# Patient Record
Sex: Male | Born: 1940
Health system: Southern US, Community
[De-identification: ages and names within clinical notes are randomized; demographics above are authoritative.]

## PROBLEM LIST (undated history)

## (undated) DIAGNOSIS — C189 Malignant neoplasm of colon, unspecified: Secondary | ICD-10-CM

## (undated) DIAGNOSIS — T148XXA Other injury of unspecified body region, initial encounter: Secondary | ICD-10-CM

## (undated) DIAGNOSIS — I10 Essential (primary) hypertension: Secondary | ICD-10-CM

## (undated) DIAGNOSIS — K469 Unspecified abdominal hernia without obstruction or gangrene: Secondary | ICD-10-CM

## (undated) HISTORY — PX: EYE SURGERY: SHX253

## (undated) HISTORY — DX: Unspecified abdominal hernia without obstruction or gangrene: K46.9

## (undated) HISTORY — PX: HERNIA REPAIR: SHX51

## (undated) HISTORY — PX: VASECTOMY: SHX75

## (undated) HISTORY — PX: FRACTURE SURGERY: SHX138

## (undated) HISTORY — PX: TONSILLECTOMY: SUR1361

---

## 2012-09-29 DIAGNOSIS — E119 Type 2 diabetes mellitus without complications: Secondary | ICD-10-CM | POA: Insufficient documentation

## 2012-09-29 DIAGNOSIS — E1165 Type 2 diabetes mellitus with hyperglycemia: Secondary | ICD-10-CM | POA: Insufficient documentation

## 2012-09-29 DIAGNOSIS — E1140 Type 2 diabetes mellitus with diabetic neuropathy, unspecified: Secondary | ICD-10-CM | POA: Insufficient documentation

## 2012-09-29 DIAGNOSIS — T7840XA Allergy, unspecified, initial encounter: Secondary | ICD-10-CM | POA: Insufficient documentation

## 2017-05-28 ENCOUNTER — Other Ambulatory Visit: Payer: Self-pay | Admitting: Family Medicine

## 2017-05-28 ENCOUNTER — Ambulatory Visit (INDEPENDENT_AMBULATORY_CARE_PROVIDER_SITE_OTHER): Payer: PPO | Admitting: Family Medicine

## 2017-05-28 ENCOUNTER — Encounter: Payer: Self-pay | Admitting: Family Medicine

## 2017-05-28 VITALS — BP 156/82 | HR 71 | Resp 16 | Ht <= 58 in | Wt 228.0 lb

## 2017-05-28 DIAGNOSIS — R2681 Unsteadiness on feet: Secondary | ICD-10-CM

## 2017-05-28 DIAGNOSIS — Z8639 Personal history of other endocrine, nutritional and metabolic disease: Secondary | ICD-10-CM | POA: Insufficient documentation

## 2017-05-28 DIAGNOSIS — E669 Obesity, unspecified: Secondary | ICD-10-CM

## 2017-05-28 DIAGNOSIS — R739 Hyperglycemia, unspecified: Secondary | ICD-10-CM | POA: Insufficient documentation

## 2017-05-28 DIAGNOSIS — R011 Cardiac murmur, unspecified: Secondary | ICD-10-CM

## 2017-05-28 DIAGNOSIS — I878 Other specified disorders of veins: Secondary | ICD-10-CM | POA: Insufficient documentation

## 2017-05-28 DIAGNOSIS — N3001 Acute cystitis with hematuria: Secondary | ICD-10-CM | POA: Diagnosis not present

## 2017-05-28 DIAGNOSIS — I10 Essential (primary) hypertension: Secondary | ICD-10-CM

## 2017-05-28 DIAGNOSIS — R300 Dysuria: Secondary | ICD-10-CM

## 2017-05-28 HISTORY — DX: Unsteadiness on feet: R26.81

## 2017-05-28 HISTORY — DX: Other specified disorders of veins: I87.8

## 2017-05-28 HISTORY — DX: Obesity, unspecified: E66.9

## 2017-05-28 HISTORY — DX: Personal history of other endocrine, nutritional and metabolic disease: Z86.39

## 2017-05-28 HISTORY — DX: Cardiac murmur, unspecified: R01.1

## 2017-05-28 LAB — POCT URINALYSIS DIPSTICK
Bilirubin, UA: NEGATIVE
Glucose, UA: NEGATIVE
Ketones, UA: NEGATIVE
Nitrite, UA: POSITIVE
PH UA: 6 (ref 5.0–8.0)
Spec Grav, UA: 1.015 (ref 1.010–1.025)
UROBILINOGEN UA: 0.2 U/dL

## 2017-05-28 MED ORDER — LISINOPRIL 5 MG PO TABS: 5.0000 mg | ORAL_TABLET | Freq: Every day | ORAL | 2 refills | Status: DC

## 2017-05-28 MED ORDER — SULFAMETHOXAZOLE-TRIMETHOPRIM 800-160 MG PO TABS
1.0000 | ORAL_TABLET | Freq: Two times a day (BID) | ORAL | 0 refills | Status: DC
Start: 2017-05-28 — End: 2017-06-30

## 2017-05-28 NOTE — Progress Notes (Signed)
Date:  05/28/2017   Name:  Keith Reynolds   DOB:  September 14, 1940   MRN:  [id number]  PCP:  Adline Potter, MD    Chief Complaint: Establish Care; Hypertension (BP used to be 120-140/80 and now after activity in morning he goes up 160-200/80 and later it goes down. Worse at dentist. ); and Dysuria (off and on since November. Added cranberry juice and it helped but it does keep coming back. )   History of Present Illness:  This is a 77 y.o. male seen for initial visit. Hx T2DM with BGs in 300s and BLE pain in 2013, resolved with improved diet, has not been rechecked since 2014. Has noticed elevated SBP at dentist since November, no hx HTN. Also c/o intermittent dysuria since November generally well controlled with cranberry juice bid, no hx UTI. Had hernia surgery 2002, ok since. Shingles 8 yrs ago, none since. On no medssupps, uses ibuprofen rarely. Father died MI 81, mother died 61 unknown cause, sister died CVA, sister 54 with IDDM. Declines all imms, had colonoscopy 40 yrs ago and stool checked for blood 2013, both normal. Weight stable, no recent falls. Wears glasses, last saw optho 2017.  Review of Systems:  Review of Systems  Constitutional: Negative for chills, fatigue and fever.  HENT: Negative for ear pain, sinus pain and trouble swallowing.   Eyes: Negative for pain.  Respiratory: Negative for cough and shortness of breath.   Cardiovascular: Negative for chest pain and leg swelling.  Gastrointestinal: Negative for abdominal pain, constipation, diarrhea, nausea and vomiting.  Endocrine: Negative for polydipsia and polyuria.  Genitourinary: Negative for difficulty urinating and hematuria.  Musculoskeletal: Negative for joint swelling.  Neurological: Negative for tremors, syncope, weakness and light-headedness.  Psychiatric/Behavioral: Negative for sleep disturbance.    Patient Active Problem List   Diagnosis Date Noted  . Hyperglycemia 05/28/2017  . Hypertension 05/28/2017   . Heart murmur, systolic 10/11/1599  . History of diabetes mellitus 05/28/2017  . Obesity (BMI 30-39.9) 05/28/2017  . Gait instability 05/28/2017  . Chronic venous stasis 05/28/2017  . Allergy 09/29/2012  . Diabetic neuropathy ([place]) 09/29/2012  . Uncontrolled diabetes mellitus ([place]) 09/29/2012    Prior to Admission medications   Medication Sig Start Date End Date Taking? Authorizing Provider  lisinopril (PRINIVIL,ZESTRIL) 5 MG tablet Take 1 tablet (5 mg total) by mouth daily. 05/28/17   Sutton Hirsch, Gwyndolyn Saxon, MD  sulfamethoxazole-trimethoprim (BACTRIM DS,SEPTRA DS) 800-160 MG tablet Take 1 tablet by mouth 2 (two) times daily. 05/28/17   Adline Potter, MD    Allergies  Allergen Reactions  . Antihistamines, Diphenhydramine-Type   . Chamomile Other (See Comments)  . Diphenhydramine Hcl Other (See Comments)    dizziness  . Pseudoephedrine Hcl Other (See Comments)    dizziness  . Triprolidine Hcl Other (See Comments)    dizziness    Past Surgical History:  Procedure Laterality Date  . HERNIA REPAIR      Social History   Tobacco Use  . Smoking status: Former Smoker    Packs/day: 0.25    Last attempt to quit: 04/14/1983    Years since quitting: 34.1  . Smokeless tobacco: Never Used  Substance Use Topics  . Alcohol use: No    Frequency: Never  . Drug use: No    Family History  Problem Relation Age of Onset  . Heart attack Father   . Diabetes Sister     Medication list has been reviewed and updated.  Physical Examination: BP (!) 156/82  Pulse 71   Resp 16   Ht 3\' 5"  (1.041 m)   Wt 228 lb (103.4 kg)   SpO2 97%   BMI 95.36 kg/m   Physical Exam  Constitutional: He is oriented to person, place, and time. He appears well-developed and well-nourished.  HENT:  Head: Normocephalic and atraumatic.  Right Ear: External ear normal.  Left Ear: External ear normal.  Nose: Nose normal.  Mouth/Throat: Oropharynx is clear and moist.  TMs clear  Eyes: Conjunctivae and EOM are  normal. Pupils are equal, round, and reactive to light.  Neck: Neck supple. No thyromegaly present.  Cardiovascular: Normal rate, regular rhythm and intact distal pulses.  2/6 SEM at RUSB  Pulmonary/Chest: Effort normal and breath sounds normal.  Abdominal: Soft. He exhibits no distension and no mass. There is no tenderness.  Musculoskeletal:  Trace BLE edema  Lymphadenopathy:    He has no cervical adenopathy.  Neurological: He is alert and oriented to person, place, and time. Coordination normal.  Romberg wobbly, gait sl unsteady  Skin: Skin is warm and dry.  Chronic venous changes BLE  Psychiatric: He has a normal mood and affect. His behavior is normal.  Nursing note and vitals reviewed.   Assessment and Plan:  1. Acute cystitis with hematuria Bactrim DS bid x 3d, recheck UA next visit - Urine Culture  2. Dysuria UA sm blood, neg glucose, lg leuk, pos nit - POCT urinalysis dipstick  3. Essential hypertension Begin lisinopril 5 mg daily, consider diuretic if a1c ok - Comprehensive Metabolic Panel (CMET) - CBC  4. Heart murmur, systolic Asymptomatic, monitor  5. Obesity (BMI 30-39.9) - TSH - Lipid Profile  6. Gait instability - B12  7. Chronic venous stasis Asymptomatic, monitor  8. History of diabetes mellitus Diet controlled since 2013 - HgB A1c  Return in about 4 weeks (around 06/25/2017).   45 minutes spent with pt over half in counseling  Shanterria Franta M. [facility]  05/28/2017

## 2017-05-29 LAB — CBC
HEMOGLOBIN: 16.6 g/dL (ref 13.0–17.7)
Hematocrit: 47.6 % (ref 37.5–51.0)
MCH: 32.4 pg (ref 26.6–33.0)
MCHC: 34.9 g/dL (ref 31.5–35.7)
MCV: 93 fL (ref 79–97)
Platelets: 179 10*3/uL (ref 150–379)
RBC: 5.13 x10E6/uL (ref 4.14–5.80)
RDW: 14.2 % (ref 12.3–15.4)
WBC: 6 10*3/uL (ref 3.4–10.8)

## 2017-05-29 LAB — LIPID PANEL
CHOL/HDL RATIO: 3.7 ratio (ref 0.0–5.0)
Cholesterol, Total: 148 mg/dL (ref 100–199)
HDL: 40 mg/dL (ref >39)
LDL CALC: 85 mg/dL (ref 0–99)
Triglycerides: 114 mg/dL (ref 0–149)
VLDL Cholesterol Cal: 23 mg/dL (ref 5–40)

## 2017-05-29 LAB — COMPREHENSIVE METABOLIC PANEL
A/G RATIO: 1.9 (ref 1.2–2.2)
ALT: 21 IU/L (ref 0–44)
AST: 23 IU/L (ref 0–40)
Albumin: 4.7 g/dL (ref 3.5–4.8)
Alkaline Phosphatase: 70 IU/L (ref 39–117)
BUN/Creatinine Ratio: 21 (ref 10–24)
BUN: 13 mg/dL (ref 8–27)
Bilirubin Total: 0.9 mg/dL (ref 0.0–1.2)
CALCIUM: 9.9 mg/dL (ref 8.6–10.2)
CO2: 25 mmol/L (ref 20–29)
CREATININE: 0.62 mg/dL — AB (ref 0.76–1.27)
Chloride: 95 mmol/L — ABNORMAL LOW (ref 96–106)
GFR, EST AFRICAN AMERICAN: 111 mL/min/{1.73_m2} (ref >59)
GFR, EST NON AFRICAN AMERICAN: 96 mL/min/{1.73_m2} (ref >59)
GLOBULIN, TOTAL: 2.5 g/dL (ref 1.5–4.5)
Glucose: 144 mg/dL — ABNORMAL HIGH (ref 65–99)
Potassium: 4.8 mmol/L (ref 3.5–5.2)
SODIUM: 138 mmol/L (ref 134–144)
TOTAL PROTEIN: 7.2 g/dL (ref 6.0–8.5)

## 2017-05-29 LAB — HEMOGLOBIN A1C
ESTIMATED AVERAGE GLUCOSE: 123 mg/dL
HEMOGLOBIN A1C: 5.9 % — AB (ref 4.8–5.6)

## 2017-05-29 LAB — VITAMIN B12: Vitamin B-12: 681 pg/mL (ref 232–1245)

## 2017-05-29 LAB — TSH: TSH: 2.16 u[IU]/mL (ref 0.450–4.500)

## 2017-05-30 LAB — URINE CULTURE

## 2017-06-03 ENCOUNTER — Encounter: Payer: Self-pay | Admitting: Family Medicine

## 2017-06-03 DIAGNOSIS — R7303 Prediabetes: Secondary | ICD-10-CM | POA: Insufficient documentation

## 2017-06-30 ENCOUNTER — Encounter: Payer: Self-pay | Admitting: Family Medicine

## 2017-06-30 ENCOUNTER — Ambulatory Visit (INDEPENDENT_AMBULATORY_CARE_PROVIDER_SITE_OTHER): Payer: PPO | Admitting: Family Medicine

## 2017-06-30 VITALS — BP 128/84 | HR 71 | Resp 16 | Ht 67.0 in | Wt 232.0 lb

## 2017-06-30 DIAGNOSIS — R319 Hematuria, unspecified: Secondary | ICD-10-CM | POA: Diagnosis not present

## 2017-06-30 DIAGNOSIS — R7303 Prediabetes: Secondary | ICD-10-CM

## 2017-06-30 DIAGNOSIS — I10 Essential (primary) hypertension: Secondary | ICD-10-CM | POA: Diagnosis not present

## 2017-06-30 DIAGNOSIS — E669 Obesity, unspecified: Secondary | ICD-10-CM | POA: Diagnosis not present

## 2017-06-30 LAB — POCT URINALYSIS DIPSTICK
Bilirubin, UA: NEGATIVE
Blood, UA: NEGATIVE
Clarity, UA: NORMAL
Glucose, UA: NEGATIVE
KETONES UA: NEGATIVE
Leukocytes, UA: NEGATIVE
NITRITE UA: NEGATIVE
PH UA: 6.5 (ref 5.0–8.0)
PROTEIN UA: NEGATIVE
SPEC GRAV UA: 1.01 (ref 1.010–1.025)
UROBILINOGEN UA: 0.2 U/dL

## 2017-06-30 NOTE — Progress Notes (Signed)
Date:  06/30/2017   Name:  Keith Reynolds   DOB:  01-18-1941   MRN:  [id number]  PCP:  Adline Potter, MD    Chief Complaint: Hypertension (Taking 3 times a day one minute apart in AM usually. Was 127/72 this morning then as he moved around and did things it went to 165/84 and 175/93 ) and Cystitis (follow up -Had severe reaction to Rx and finished it but did not agree with how bad he was on this. )   History of Present Illness:  This is a 77 y.o. male seen for one month f/u from initial visit. Lisinopril started for HTN, BP still high at dentist office and with activity but tolerating med well. Had nausea with Bactrim but completed course, cx negative. Up to 20 mins exercise daily. Labs ok, prediabetes well controlled. Still declines all imms.  Review of Systems:  Review of Systems  Constitutional: Negative for chills and fever.  Respiratory: Negative for cough and shortness of breath.   Cardiovascular: Negative for chest pain.  Genitourinary: Negative for difficulty urinating.  Neurological: Negative for syncope and light-headedness.    Patient Active Problem List   Diagnosis Date Noted  . Prediabetes 06/03/2017  . Hypertension 05/28/2017  . Heart murmur, systolic 97/05/6376  . History of diabetes mellitus 05/28/2017  . Obesity (BMI 30-39.9) 05/28/2017  . Gait instability 05/28/2017  . Chronic venous stasis 05/28/2017    Prior to Admission medications   Medication Sig Start Date End Date Taking? Authorizing Provider  lisinopril (PRINIVIL,ZESTRIL) 5 MG tablet Take 1 tablet (5 mg total) by mouth daily. 05/28/17  Yes Marvion Bastidas, Gwyndolyn Saxon, MD    Allergies  Allergen Reactions  . Antihistamines, Diphenhydramine-Type   . Bactrim [Sulfamethoxazole-Trimethoprim] Nausea Only  . Chamomile Other (See Comments)  . Diphenhydramine Hcl Other (See Comments)    dizziness  . Pseudoephedrine Hcl Other (See Comments)    dizziness  . Triprolidine Hcl Other (See Comments)    dizziness     Past Surgical History:  Procedure Laterality Date  . HERNIA REPAIR      Social History   Tobacco Use  . Smoking status: Former Smoker    Packs/day: 0.25    Last attempt to quit: 04/14/1983    Years since quitting: 34.2  . Smokeless tobacco: Never Used  Substance Use Topics  . Alcohol use: No    Frequency: Never  . Drug use: No    Family History  Problem Relation Age of Onset  . Heart attack Father   . Diabetes Sister     Medication list has been reviewed and updated.  Physical Examination: BP 128/84   Pulse 71   Resp 16   Ht 5\' 7"  (1.702 m)   Wt 232 lb (105.2 kg)   SpO2 100%   BMI 36.34 kg/m   Physical Exam  Constitutional: He appears well-developed and well-nourished.  Cardiovascular: Normal rate and regular rhythm.  Pulmonary/Chest: Effort normal and breath sounds normal.  Musculoskeletal: He exhibits no edema.  Neurological: He is alert.  Skin: Skin is warm and dry.  Psychiatric: He has a normal mood and affect. His behavior is normal.  Nursing note and vitals reviewed.   Assessment and Plan:  1. Essential hypertension Well controlled on lisinopril, note for dentist given, reassured transient high BPs ok  2. Prediabetes Well controlled on diet  3. Obesity (BMI 30-39.9) Weight up 4#, encouraged increase exercise to 30 mins daily  4. Hematuria, unspecified type UA negative today - POCT  Urinalysis Dipstick  Return in about 3 months (around 09/30/2017).  Satira Anis. [facility]  06/30/2017

## 2017-09-30 ENCOUNTER — Ambulatory Visit: Payer: PPO | Admitting: Family Medicine

## 2018-05-13 DIAGNOSIS — R7302 Impaired glucose tolerance (oral): Secondary | ICD-10-CM | POA: Diagnosis not present

## 2018-05-13 DIAGNOSIS — I10 Essential (primary) hypertension: Secondary | ICD-10-CM | POA: Diagnosis not present

## 2018-05-16 DIAGNOSIS — R7302 Impaired glucose tolerance (oral): Secondary | ICD-10-CM | POA: Diagnosis not present

## 2018-05-16 DIAGNOSIS — I10 Essential (primary) hypertension: Secondary | ICD-10-CM | POA: Diagnosis not present

## 2018-06-03 DIAGNOSIS — H2513 Age-related nuclear cataract, bilateral: Secondary | ICD-10-CM | POA: Diagnosis not present

## 2018-06-20 DIAGNOSIS — H2513 Age-related nuclear cataract, bilateral: Secondary | ICD-10-CM | POA: Diagnosis not present

## 2018-06-28 DIAGNOSIS — I10 Essential (primary) hypertension: Secondary | ICD-10-CM | POA: Diagnosis not present

## 2018-06-28 DIAGNOSIS — E785 Hyperlipidemia, unspecified: Secondary | ICD-10-CM | POA: Diagnosis not present

## 2018-06-28 DIAGNOSIS — E1169 Type 2 diabetes mellitus with other specified complication: Secondary | ICD-10-CM | POA: Diagnosis not present

## 2018-08-23 DIAGNOSIS — Z1159 Encounter for screening for other viral diseases: Secondary | ICD-10-CM | POA: Diagnosis not present

## 2018-08-23 DIAGNOSIS — Z20828 Contact with and (suspected) exposure to other viral communicable diseases: Secondary | ICD-10-CM | POA: Diagnosis not present

## 2018-08-25 DIAGNOSIS — Z882 Allergy status to sulfonamides status: Secondary | ICD-10-CM | POA: Diagnosis not present

## 2018-08-25 DIAGNOSIS — H2511 Age-related nuclear cataract, right eye: Secondary | ICD-10-CM | POA: Diagnosis not present

## 2018-08-25 DIAGNOSIS — E785 Hyperlipidemia, unspecified: Secondary | ICD-10-CM | POA: Diagnosis not present

## 2018-08-25 DIAGNOSIS — Z87891 Personal history of nicotine dependence: Secondary | ICD-10-CM | POA: Diagnosis not present

## 2018-08-25 DIAGNOSIS — E1136 Type 2 diabetes mellitus with diabetic cataract: Secondary | ICD-10-CM | POA: Diagnosis not present

## 2018-08-25 DIAGNOSIS — I10 Essential (primary) hypertension: Secondary | ICD-10-CM | POA: Diagnosis not present

## 2018-08-30 DIAGNOSIS — Z1159 Encounter for screening for other viral diseases: Secondary | ICD-10-CM | POA: Diagnosis not present

## 2018-08-30 DIAGNOSIS — Z20828 Contact with and (suspected) exposure to other viral communicable diseases: Secondary | ICD-10-CM | POA: Diagnosis not present

## 2018-09-01 DIAGNOSIS — I10 Essential (primary) hypertension: Secondary | ICD-10-CM | POA: Diagnosis not present

## 2018-09-01 DIAGNOSIS — Z79899 Other long term (current) drug therapy: Secondary | ICD-10-CM | POA: Diagnosis not present

## 2018-09-01 DIAGNOSIS — H2512 Age-related nuclear cataract, left eye: Secondary | ICD-10-CM | POA: Diagnosis not present

## 2018-09-01 DIAGNOSIS — Z882 Allergy status to sulfonamides status: Secondary | ICD-10-CM | POA: Diagnosis not present

## 2018-09-01 DIAGNOSIS — E669 Obesity, unspecified: Secondary | ICD-10-CM | POA: Diagnosis not present

## 2018-09-01 DIAGNOSIS — E1136 Type 2 diabetes mellitus with diabetic cataract: Secondary | ICD-10-CM | POA: Diagnosis not present

## 2018-09-01 DIAGNOSIS — E785 Hyperlipidemia, unspecified: Secondary | ICD-10-CM | POA: Diagnosis not present

## 2018-09-01 DIAGNOSIS — Z87891 Personal history of nicotine dependence: Secondary | ICD-10-CM | POA: Diagnosis not present

## 2019-03-13 DIAGNOSIS — Z8639 Personal history of other endocrine, nutritional and metabolic disease: Secondary | ICD-10-CM | POA: Diagnosis not present

## 2019-03-16 DIAGNOSIS — Z0000 Encounter for general adult medical examination without abnormal findings: Secondary | ICD-10-CM | POA: Diagnosis not present

## 2019-03-16 DIAGNOSIS — I10 Essential (primary) hypertension: Secondary | ICD-10-CM | POA: Diagnosis not present

## 2019-03-16 DIAGNOSIS — E119 Type 2 diabetes mellitus without complications: Secondary | ICD-10-CM | POA: Diagnosis not present

## 2019-08-23 DIAGNOSIS — B029 Zoster without complications: Secondary | ICD-10-CM | POA: Diagnosis not present

## 2019-09-07 DIAGNOSIS — E119 Type 2 diabetes mellitus without complications: Secondary | ICD-10-CM | POA: Diagnosis not present

## 2019-09-14 DIAGNOSIS — B029 Zoster without complications: Secondary | ICD-10-CM | POA: Diagnosis not present

## 2019-09-14 DIAGNOSIS — I10 Essential (primary) hypertension: Secondary | ICD-10-CM | POA: Diagnosis not present

## 2019-09-14 DIAGNOSIS — E871 Hypo-osmolality and hyponatremia: Secondary | ICD-10-CM | POA: Diagnosis not present

## 2019-09-14 DIAGNOSIS — E1165 Type 2 diabetes mellitus with hyperglycemia: Secondary | ICD-10-CM | POA: Diagnosis not present

## 2020-03-14 DIAGNOSIS — E1165 Type 2 diabetes mellitus with hyperglycemia: Secondary | ICD-10-CM | POA: Diagnosis not present

## 2020-03-20 DIAGNOSIS — E1165 Type 2 diabetes mellitus with hyperglycemia: Secondary | ICD-10-CM | POA: Diagnosis not present

## 2020-03-20 DIAGNOSIS — E669 Obesity, unspecified: Secondary | ICD-10-CM | POA: Diagnosis not present

## 2020-03-20 DIAGNOSIS — R634 Abnormal weight loss: Secondary | ICD-10-CM | POA: Diagnosis not present

## 2020-03-20 DIAGNOSIS — K529 Noninfective gastroenteritis and colitis, unspecified: Secondary | ICD-10-CM | POA: Diagnosis not present

## 2020-03-20 DIAGNOSIS — I10 Essential (primary) hypertension: Secondary | ICD-10-CM | POA: Diagnosis not present

## 2020-03-20 DIAGNOSIS — Z0000 Encounter for general adult medical examination without abnormal findings: Secondary | ICD-10-CM | POA: Diagnosis not present

## 2020-06-26 ENCOUNTER — Other Ambulatory Visit: Payer: Self-pay | Admitting: Gastroenterology

## 2020-06-26 ENCOUNTER — Other Ambulatory Visit (HOSPITAL_COMMUNITY): Payer: Self-pay | Admitting: Gastroenterology

## 2020-06-26 DIAGNOSIS — R197 Diarrhea, unspecified: Secondary | ICD-10-CM | POA: Diagnosis not present

## 2020-06-26 DIAGNOSIS — R1030 Lower abdominal pain, unspecified: Secondary | ICD-10-CM

## 2020-06-26 DIAGNOSIS — R634 Abnormal weight loss: Secondary | ICD-10-CM

## 2020-06-27 DIAGNOSIS — R197 Diarrhea, unspecified: Secondary | ICD-10-CM | POA: Diagnosis not present

## 2020-07-12 ENCOUNTER — Ambulatory Visit
Admission: RE | Admit: 2020-07-12 | Discharge: 2020-07-12 | Disposition: A | Discharge status: Home / Self Care | Payer: PPO | Source: Ambulatory Visit | Attending: Gastroenterology | Admitting: Gastroenterology

## 2020-07-12 ENCOUNTER — Other Ambulatory Visit: Payer: Self-pay

## 2020-07-12 DIAGNOSIS — R1030 Lower abdominal pain, unspecified: Secondary | ICD-10-CM | POA: Insufficient documentation

## 2020-07-12 DIAGNOSIS — R109 Unspecified abdominal pain: Secondary | ICD-10-CM | POA: Diagnosis not present

## 2020-07-12 DIAGNOSIS — R197 Diarrhea, unspecified: Secondary | ICD-10-CM | POA: Diagnosis not present

## 2020-07-12 DIAGNOSIS — R634 Abnormal weight loss: Secondary | ICD-10-CM | POA: Insufficient documentation

## 2020-07-12 IMAGING — CT CT ABD-PELV W/O CM
2 of 4 series · 13 of 46 positions shown, 15 images · non-contrast
Comparison: None
COMPARISON: None

Addendum:
CLINICAL DATA: Constant diarrhea, abdominal pain

EXAM:
CT ABDOMEN AND PELVIS WITHOUT CONTRAST
TECHNIQUE: Multidetector CT imaging of the abdomen and pelvis was performed
following the standard protocol without IV contrast.

[Series 2: axials routine abdomen pelvis without 5.00 · axial · non-contrast · 0.98mm/px · z∈[-1553,-1153]mm · 10 of 98 slices shown, 12 images]
[im 9/98  soft-tissue]
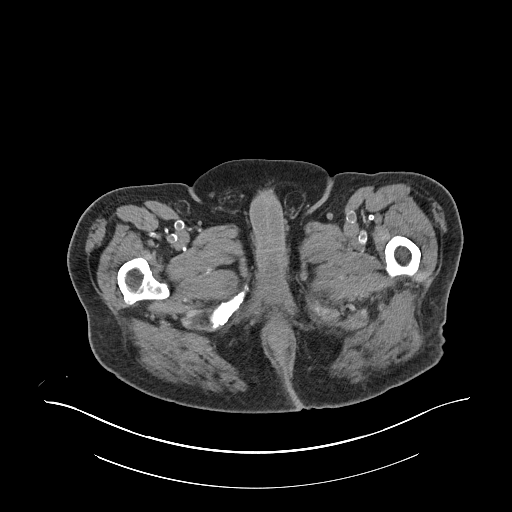
[im 9/98  bone]
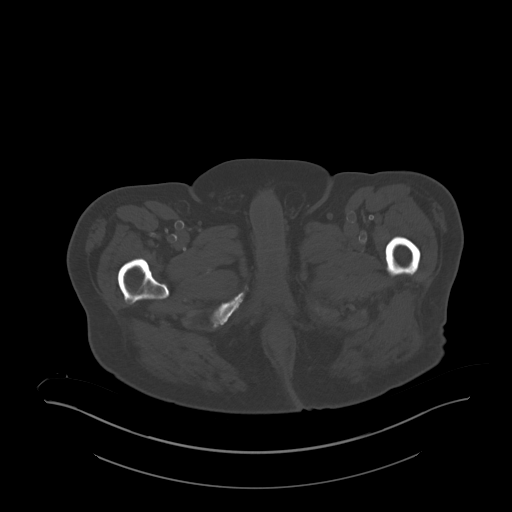
[im 17/98  soft-tissue]
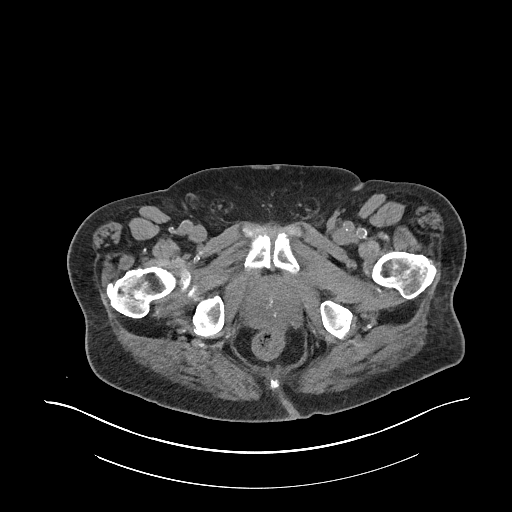
[im 25/98  soft-tissue]
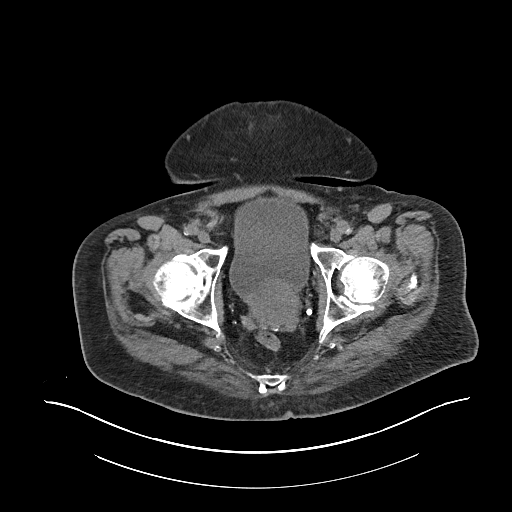
[im 37/98  soft-tissue]
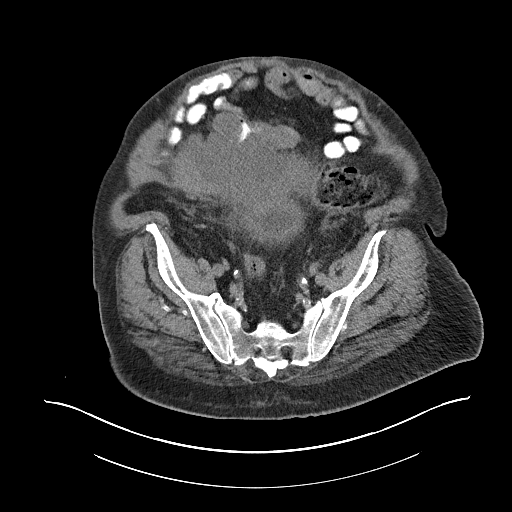
[im 45/98  soft-tissue]
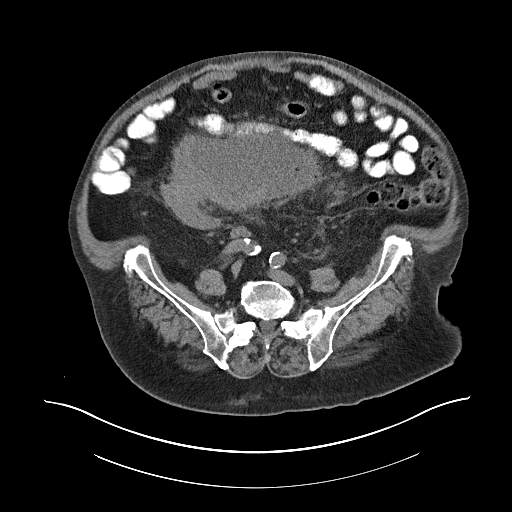
[im 53/98  soft-tissue]
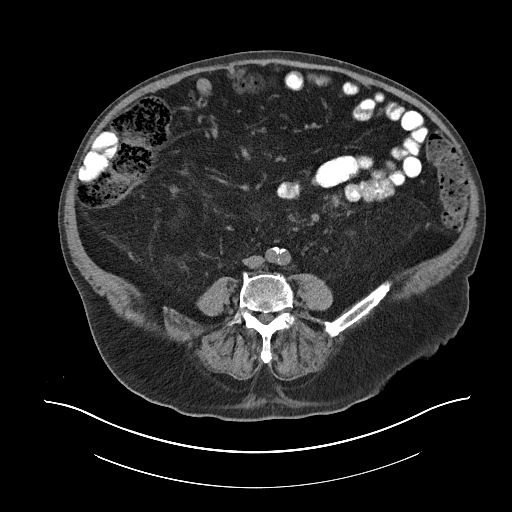
[im 61/98  soft-tissue]
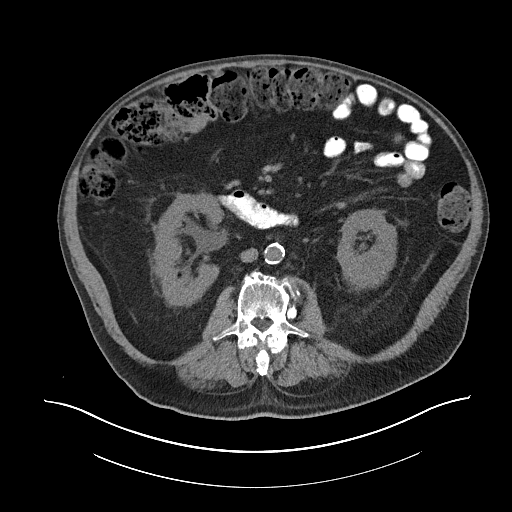
[im 73/98  soft-tissue]
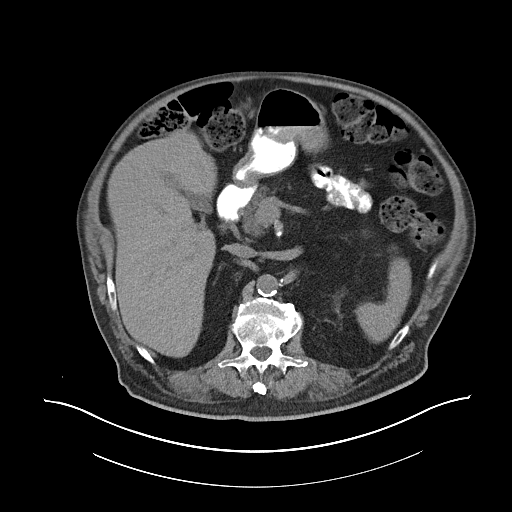
[im 81/98  soft-tissue]
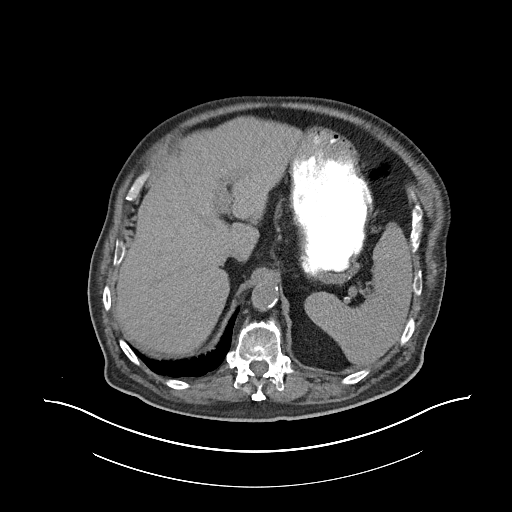
[im 81/98  bone]
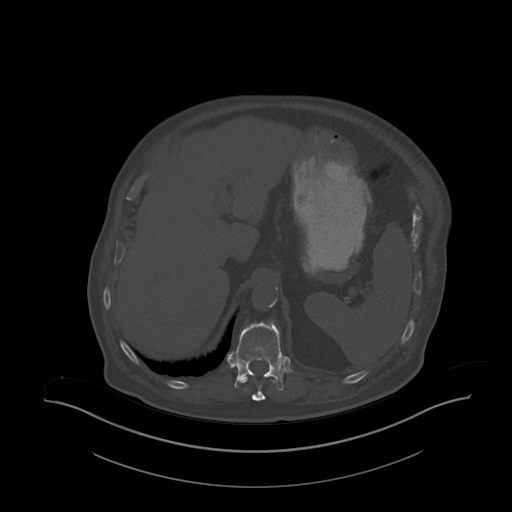
[im 89/98  soft-tissue]
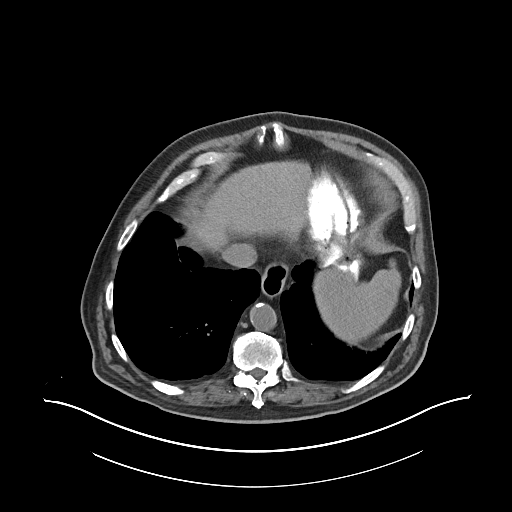

[Series 4: coronals routine abdomen pelvis without 2.00 cor · coronal · non-contrast · 0.83mm/px · 3 of 185 slices shown]
[im 62/185  soft-tissue]
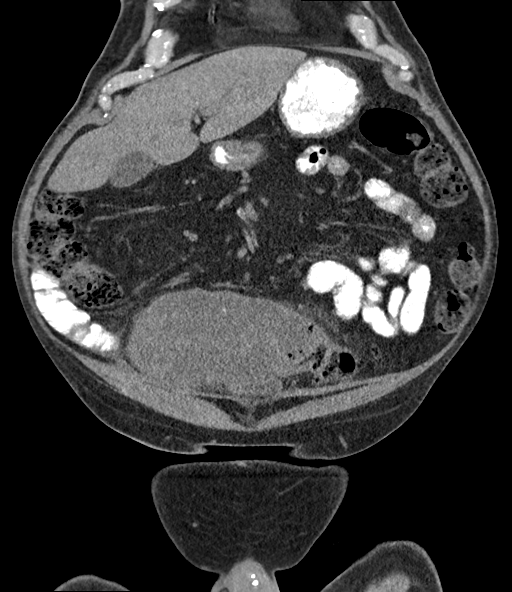
[im 82/185  soft-tissue]
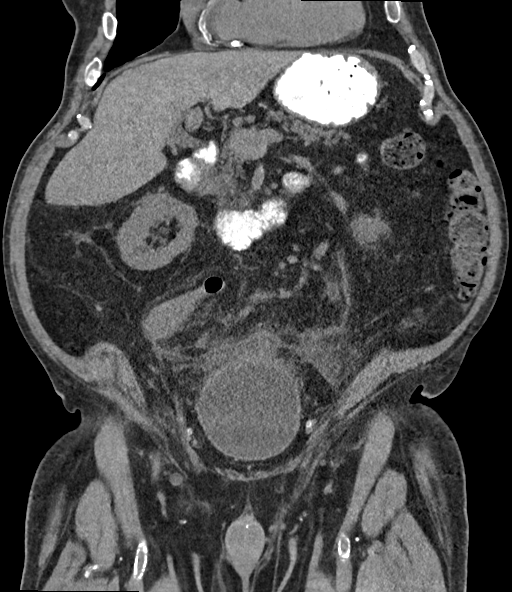
[im 103/185  soft-tissue]
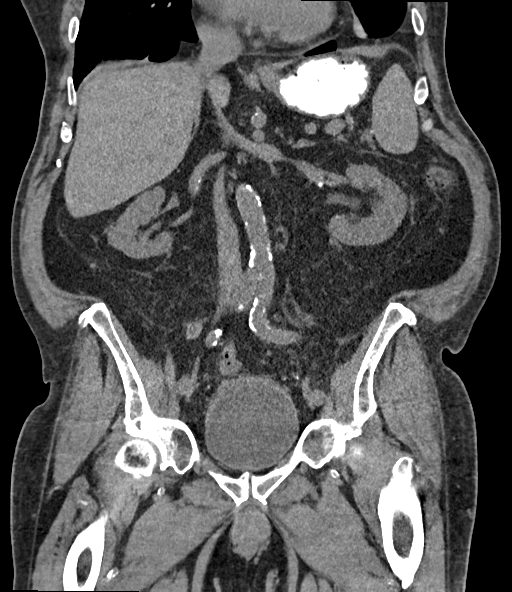

[13 of 46 positions shown; findings below may reference images not displayed]

FINDINGS: Lower chest: Lung bases are clear.

Hepatobiliary: No focal, suspicious hepatic lesion. No
pericholecystic stranding. No gross biliary duct distension.

Pancreas: Pancreas with no sign of inflammation or ductal dilation.

Spleen: Normal size and contour.

Adrenals/Urinary Tract: Adrenal glands are normal.

Symmetric renal cortical scarring seen bilaterally. No
hydronephrosis. Mild perinephric stranding. No nephrolithiasis.
Urinary bladder with stranding along the superior aspect of the
urinary bladder with adjacent inflammation arising from the sigmoid
and small bowel. See below.

Stomach/Bowel: Masslike area in the central abdomen filling the
sigmoid colon but without signs of bowel obstruction. This measures
15 x 8 cm. The small bowel is thickened and nodular adjacent to the
colon. Enteric contrast media has passed into the distal small
bowel. Not yet into the colon and does not pass from the small bowel
into the sigmoid colon. The colon is irregular in this area, stool
filled above and decompressed beyond. No local adenopathy.

Vascular/Lymphatic: Calcified atheromatous plaque of the abdominal
aorta. There is no gastrohepatic or hepatoduodenal ligament
lymphadenopathy. No retroperitoneal or mesenteric lymphadenopathy.

No pelvic sidewall lymphadenopathy.

Reproductive: Prostate mildly heterogeneous. Moderately enlarged,
nonspecific on CT.

Other: No ascites. No free air. Signs of ventral abdominal wall
reconstruction in the past are suggested with laxity over the lower
abdomen, mesh reconstructive changes anterior to involve small bowel
loops and colon. Small amount of fat herniation along the margin of
the mesh best seen on the sagittal images, sagittal image 102.

Patulous esophagus with some contrast in the lumen. The appendix is
normal.

Musculoskeletal: No acute or destructive bone finding. Spinal
degenerative changes.
IMPRESSION: 1. Masslike area in the sigmoid colon with adjacent small bowel
thickening most suspicious for underlying colonic neoplasm, it is
possible that this represents sequela of chronic and repeated
diverticulitis though this is felt less likely given the irregular
appearance of this masslike area. Large mucinous tumor could have
this appearance. GI and or surgical consultation is suggested for
further evaluation. No free air or ascites.
2. No visible adenopathy.

A call is out to the referring provider to further discuss findings
in the above case.

ADDENDUM:
Masslike area with adjacent small bowel thickening and close
proximity to urinary bladder as well as ventral abdominal wall
reconstructive changes were related to the provider as outlined
below.

These results were called by telephone at the time of interpretation
on [DATE] at [DATE] to provider CHIPING , who verbally
acknowledged these results.

*** End of Addendum ***
FINDINGS: Lower chest: Lung bases are clear.

Hepatobiliary: No focal, suspicious hepatic lesion. No
pericholecystic stranding. No gross biliary duct distension.

Pancreas: Pancreas with no sign of inflammation or ductal dilation.

Spleen: Normal size and contour.

Adrenals/Urinary Tract: Adrenal glands are normal.

Symmetric renal cortical scarring seen bilaterally. No
hydronephrosis. Mild perinephric stranding. No nephrolithiasis.
Urinary bladder with stranding along the superior aspect of the
urinary bladder with adjacent inflammation arising from the sigmoid
and small bowel. See below.

Stomach/Bowel: Masslike area in the central abdomen filling the
sigmoid colon but without signs of bowel obstruction. This measures
15 x 8 cm. The small bowel is thickened and nodular adjacent to the
colon. Enteric contrast media has passed into the distal small
bowel. Not yet into the colon and does not pass from the small bowel
into the sigmoid colon. The colon is irregular in this area, stool
filled above and decompressed beyond. No local adenopathy.

Vascular/Lymphatic: Calcified atheromatous plaque of the abdominal
aorta. There is no gastrohepatic or hepatoduodenal ligament
lymphadenopathy. No retroperitoneal or mesenteric lymphadenopathy.

No pelvic sidewall lymphadenopathy.

Reproductive: Prostate mildly heterogeneous. Moderately enlarged,
nonspecific on CT.

Other: No ascites. No free air. Signs of ventral abdominal wall
reconstruction in the past are suggested with laxity over the lower
abdomen, mesh reconstructive changes anterior to involve small bowel
loops and colon. Small amount of fat herniation along the margin of
the mesh best seen on the sagittal images, sagittal image 102.

Patulous esophagus with some contrast in the lumen. The appendix is
normal.

Musculoskeletal: No acute or destructive bone finding. Spinal
degenerative changes.
IMPRESSION: 1. Masslike area in the sigmoid colon with adjacent small bowel
thickening most suspicious for underlying colonic neoplasm, it is
possible that this represents sequela of chronic and repeated
diverticulitis though this is felt less likely given the irregular
appearance of this masslike area. Large mucinous tumor could have
this appearance. GI and or surgical consultation is suggested for
further evaluation. No free air or ascites.
2. No visible adenopathy.

A call is out to the referring provider to further discuss findings
in the above case.

## 2020-07-16 DIAGNOSIS — R933 Abnormal findings on diagnostic imaging of other parts of digestive tract: Secondary | ICD-10-CM | POA: Diagnosis not present

## 2020-07-16 DIAGNOSIS — R197 Diarrhea, unspecified: Secondary | ICD-10-CM | POA: Diagnosis not present

## 2020-07-18 ENCOUNTER — Other Ambulatory Visit: Payer: Self-pay

## 2020-07-18 ENCOUNTER — Encounter: Payer: Self-pay | Admitting: *Deleted

## 2020-07-18 ENCOUNTER — Other Ambulatory Visit
Admission: RE | Admit: 2020-07-18 | Discharge: 2020-07-18 | Disposition: A | Discharge status: Home / Self Care | Payer: PPO | Source: Ambulatory Visit | Attending: Gastroenterology | Admitting: Gastroenterology

## 2020-07-18 DIAGNOSIS — Z01812 Encounter for preprocedural laboratory examination: Secondary | ICD-10-CM | POA: Insufficient documentation

## 2020-07-18 DIAGNOSIS — Z20822 Contact with and (suspected) exposure to covid-19: Secondary | ICD-10-CM | POA: Insufficient documentation

## 2020-07-18 LAB — SARS CORONAVIRUS 2 (TAT 6-24 HRS): SARS Coronavirus 2: NEGATIVE

## 2020-07-19 ENCOUNTER — Encounter: Payer: Self-pay | Admitting: *Deleted

## 2020-07-19 ENCOUNTER — Encounter
Admission: RE | Disposition: A | Discharge status: Home / Self Care | Payer: Self-pay | Source: Ambulatory Visit | Attending: Gastroenterology

## 2020-07-19 ENCOUNTER — Encounter: Payer: Self-pay | Admitting: Anesthesiology

## 2020-07-19 ENCOUNTER — Ambulatory Visit
Admission: RE | Admit: 2020-07-19 | Discharge: 2020-07-19 | Disposition: A | Discharge status: Home / Self Care | Payer: PPO | Source: Ambulatory Visit | Attending: Gastroenterology | Admitting: Gastroenterology

## 2020-07-19 DIAGNOSIS — R933 Abnormal findings on diagnostic imaging of other parts of digestive tract: Secondary | ICD-10-CM | POA: Insufficient documentation

## 2020-07-19 DIAGNOSIS — Z5309 Procedure and treatment not carried out because of other contraindication: Secondary | ICD-10-CM | POA: Diagnosis not present

## 2020-07-19 HISTORY — DX: Other injury of unspecified body region, initial encounter: T14.8XXA

## 2020-07-19 HISTORY — DX: Essential (primary) hypertension: I10

## 2020-07-19 HISTORY — PX: COLONOSCOPY WITH PROPOFOL: SHX5780

## 2020-07-19 SURGERY — COLONOSCOPY WITH PROPOFOL
Anesthesia: General

## 2020-07-19 MED ORDER — SODIUM CHLORIDE 0.9 % IV SOLN: INTRAVENOUS | Status: DC

## 2020-07-21 ENCOUNTER — Encounter: Payer: Self-pay | Admitting: Anesthesiology

## 2020-07-22 ENCOUNTER — Encounter
Admission: RE | Disposition: A | Discharge status: Home / Self Care | Payer: Self-pay | Source: Home / Self Care | Attending: Gastroenterology

## 2020-07-22 ENCOUNTER — Ambulatory Visit: Payer: PPO | Admitting: Certified Registered"

## 2020-07-22 ENCOUNTER — Other Ambulatory Visit: Payer: Self-pay

## 2020-07-22 ENCOUNTER — Encounter: Payer: Self-pay | Admitting: *Deleted

## 2020-07-22 ENCOUNTER — Ambulatory Visit
Admission: RE | Admit: 2020-07-22 | Discharge: 2020-07-22 | Disposition: A | Discharge status: Home / Self Care | Payer: PPO | Attending: Gastroenterology | Admitting: Gastroenterology

## 2020-07-22 DIAGNOSIS — C20 Malignant neoplasm of rectum: Secondary | ICD-10-CM | POA: Insufficient documentation

## 2020-07-22 DIAGNOSIS — Z538 Procedure and treatment not carried out for other reasons: Secondary | ICD-10-CM | POA: Diagnosis not present

## 2020-07-22 DIAGNOSIS — K5790 Diverticulosis of intestine, part unspecified, without perforation or abscess without bleeding: Secondary | ICD-10-CM | POA: Diagnosis not present

## 2020-07-22 DIAGNOSIS — K6389 Other specified diseases of intestine: Secondary | ICD-10-CM | POA: Diagnosis not present

## 2020-07-22 DIAGNOSIS — Z881 Allergy status to other antibiotic agents status: Secondary | ICD-10-CM | POA: Insufficient documentation

## 2020-07-22 DIAGNOSIS — D374 Neoplasm of uncertain behavior of colon: Secondary | ICD-10-CM | POA: Diagnosis not present

## 2020-07-22 DIAGNOSIS — K635 Polyp of colon: Secondary | ICD-10-CM | POA: Diagnosis not present

## 2020-07-22 DIAGNOSIS — D124 Benign neoplasm of descending colon: Secondary | ICD-10-CM | POA: Diagnosis not present

## 2020-07-22 DIAGNOSIS — D125 Benign neoplasm of sigmoid colon: Secondary | ICD-10-CM | POA: Diagnosis not present

## 2020-07-22 DIAGNOSIS — D375 Neoplasm of uncertain behavior of rectum: Secondary | ICD-10-CM | POA: Diagnosis not present

## 2020-07-22 DIAGNOSIS — R933 Abnormal findings on diagnostic imaging of other parts of digestive tract: Secondary | ICD-10-CM | POA: Diagnosis present

## 2020-07-22 DIAGNOSIS — R935 Abnormal findings on diagnostic imaging of other abdominal regions, including retroperitoneum: Secondary | ICD-10-CM | POA: Diagnosis not present

## 2020-07-22 DIAGNOSIS — Z888 Allergy status to other drugs, medicaments and biological substances status: Secondary | ICD-10-CM | POA: Insufficient documentation

## 2020-07-22 DIAGNOSIS — D490 Neoplasm of unspecified behavior of digestive system: Secondary | ICD-10-CM | POA: Diagnosis not present

## 2020-07-22 DIAGNOSIS — K6289 Other specified diseases of anus and rectum: Secondary | ICD-10-CM | POA: Diagnosis not present

## 2020-07-22 DIAGNOSIS — K56601 Complete intestinal obstruction, unspecified as to cause: Secondary | ICD-10-CM | POA: Insufficient documentation

## 2020-07-22 HISTORY — PX: COLONOSCOPY WITH PROPOFOL: SHX5780

## 2020-07-22 SURGERY — COLONOSCOPY WITH PROPOFOL
Anesthesia: General

## 2020-07-22 MED ORDER — PROPOFOL 500 MG/50ML IV EMUL
INTRAVENOUS | Status: DC | PRN
  Administered 2020-07-22: 125 ug/kg/min via INTRAVENOUS

## 2020-07-22 MED ORDER — PROPOFOL 10 MG/ML IV BOLUS
INTRAVENOUS | Status: AC
  Filled 2020-07-22: qty 20

## 2020-07-22 MED ORDER — PROPOFOL 10 MG/ML IV BOLUS
INTRAVENOUS | Status: DC | PRN
  Administered 2020-07-22: 60 mg via INTRAVENOUS

## 2020-07-22 MED ORDER — LIDOCAINE HCL (CARDIAC) PF 100 MG/5ML IV SOSY
PREFILLED_SYRINGE | INTRAVENOUS | Status: DC | PRN
  Administered 2020-07-22: 50 mg via INTRAVENOUS

## 2020-07-22 MED ORDER — EPHEDRINE SULFATE 50 MG/ML IJ SOLN
INTRAMUSCULAR | Status: DC | PRN
  Administered 2020-07-22: 10 mg via INTRAVENOUS

## 2020-07-22 MED ORDER — SPOT INK MARKER SYRINGE KIT
PACK | SUBMUCOSAL | Status: DC | PRN
  Administered 2020-07-22: 5 mL via SUBMUCOSAL

## 2020-07-22 MED ORDER — SODIUM CHLORIDE 0.9 % IV SOLN: INTRAVENOUS | Status: DC

## 2020-07-22 MED ORDER — EPHEDRINE 5 MG/ML INJ
INTRAVENOUS | Status: AC
  Filled 2020-07-22: qty 10

## 2020-07-22 NOTE — Anesthesia Preprocedure Evaluation (Signed)
Anesthesia Evaluation  Patient identified by MRN, date of birth, ID band Patient awake    Reviewed: Allergy & Precautions, NPO status , Patient's Chart, lab work & pertinent test results  History of Anesthesia Complications Negative for: history of anesthetic complications  Airway Mallampati: II       Dental   Pulmonary neg sleep apnea, neg COPD, Not current smoker, former smoker,           Cardiovascular hypertension (pt off meds for 9 months), (-) Past MI and (-) CHF (-) dysrhythmias (-) Valvular Problems/Murmurs     Neuro/Psych neg Seizures    GI/Hepatic Neg liver ROS, neg GERD  ,  Endo/Other  neg diabetes  Renal/GU negative Renal ROS     Musculoskeletal   Abdominal   Peds  Hematology   Anesthesia Other Findings   Reproductive/Obstetrics                             Anesthesia Physical Anesthesia Plan  ASA: II  Anesthesia Plan: General   Post-op Pain Management:    Induction: Intravenous  PONV Risk Score and Plan: 2 and Propofol infusion and TIVA  Airway Management Planned: Nasal Cannula  Additional Equipment:   Intra-op Plan:   Post-operative Plan:   Informed Consent: I have reviewed the patients History and Physical, chart, labs and discussed the procedure including the risks, benefits and alternatives for the proposed anesthesia with the patient or authorized representative who has indicated his/her understanding and acceptance.       Plan Discussed with:   Anesthesia Plan Comments:         Anesthesia Quick Evaluation

## 2020-07-22 NOTE — Brief Op Note (Signed)
Colonscope unable to pass partially obstructing mass in the descending colon.

## 2020-07-22 NOTE — Transfer of Care (Signed)
Immediate Anesthesia Transfer of Care Note  Patient: Keith Reynolds  Procedure(s) Performed: COLONOSCOPY WITH PROPOFOL (N/A )  Patient Location: PACU  Anesthesia Type:MAC  Level of Consciousness: drowsy  Airway & Oxygen Therapy: Patient Spontanous Breathing  Post-op Assessment: Report given to RN and Post -op Vital signs reviewed and stable  Post vital signs: stable  Last Vitals:  Vitals Value Taken Time  BP 91/55 07/22/20 1527  Temp 36.3 C 07/22/20 1524  Pulse 72 07/22/20 1528  Resp 20 07/22/20 1528  SpO2 94 % 07/22/20 1528  Vitals shown include unvalidated device data.  Last Pain:  Vitals:   07/22/20 1524  TempSrc: Temporal  PainSc:          Complications: No complications documented.

## 2020-07-22 NOTE — Op Note (Signed)
[facility] Patient Name: Keith Reynolds Procedure Date: 07/22/2020 2:57 PM MRN: [id number] Account #: [id number] Date of Birth: Jul 27, 1940 Admit Type: Outpatient Age: 80 Room: [facility] ENDO ROOM 3 Gender: Male Note Status: Finalized Procedure:             Colonoscopy Indications:           Abnormal CT of the GI tract Providers:             Andrey Farmer MD, MD Referring MD:          Sofie Hartigan (Referring MD) Medicines:             Monitored Anesthesia Care Complications:         No immediate complications. Estimated blood loss:                         Minimal. Procedure:             Pre-Anesthesia Assessment:                        - Prior to the procedure, a History and Physical was                         performed, and patient medications and allergies were                         reviewed. The patient is competent. The risks and                         benefits of the procedure and the sedation options and                         risks were discussed with the patient. All questions                         were answered and informed consent was obtained.                         Patient identification and proposed procedure were                         verified by the physician, the nurse, the anesthetist                         and the technician in the endoscopy suite. Mental                         Status Examination: alert and oriented. Airway                         Examination: normal oropharyngeal airway and neck                         mobility. Respiratory Examination: clear to                         auscultation. CV Examination: normal. Prophylactic  Antibiotics: The patient does not require prophylactic                         antibiotics. Prior Anticoagulants: The patient has                         taken no previous anticoagulant or antiplatelet                         agents. ASA Grade Assessment:  III - A patient with                         severe systemic disease. After reviewing the risks and                         benefits, the patient was deemed in satisfactory                         condition to undergo the procedure. The anesthesia                         plan was to use monitored anesthesia care (MAC).                         Immediately prior to administration of medications,                         the patient was re-assessed for adequacy to receive                         sedatives. The heart rate, respiratory rate, oxygen                         saturations, blood pressure, adequacy of pulmonary                         ventilation, and response to care were monitored                         throughout the procedure. The physical status of the                         patient was re-assessed after the procedure.                        After obtaining informed consent, the colonoscope was                         passed under direct vision. Throughout the procedure,                         the patient's blood pressure, pulse, and oxygen                         saturations were monitored continuously. The                         Colonoscope was introduced through the anus with the  intention of advancing to the cecum. The scope was                         advanced to the descending colon before the procedure                         was aborted. Medications were given. The colonoscopy                         was aborted due to a partially obstructing mass. The                         colonoscopy was performed without difficulty. The                         patient tolerated the procedure well. The quality of                         the bowel preparation was good. Findings:      The perianal and digital rectal examinations were normal.      A fungating completely obstructing large mass was found in the       descending colon. The mass was  circumferential (involving 100% of the       lumen circumference). No bleeding was present. This was biopsied with a       cold forceps for histology. Estimated blood loss was minimal. Area was       tattooed with an injection of dye.      A submucosal non-obstructing medium-sized mass was found in the rectum.       The mass was non-circumferential. No bleeding was present. This was       biopsied with a cold forceps for histology. Estimated blood loss was       minimal. Impression:            - The procedure was aborted due to partially                         obstructing mass.                        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 Biopsied. Tattooed.                        - Tumor in the rectum. Biopsied. Recommendation:        - Discharge patient to home.                        - Resume previous diet.                        - Continue present medications.                        - Await pathology results.                        - Return to referring physician as  previously                         scheduled. Procedure Code(s):     --- Professional ---                        [phone], 57, Colonoscopy, flexible; with directed                         submucosal injection(s), any substance                        [id number], 5, Colonoscopy, flexible; with biopsy, single                         or multiple Diagnosis Code(s):     --- Professional ---                        T25.498, Other complete intestinal obstruction                        D49.0, Neoplasm of unspecified behavior of digestive                         system                        R93.3, Abnormal findings on diagnostic imaging of                         other parts of digestive tract CPT copyright 2019 [employer]. All rights reserved. The codes documented in this report are preliminary and upon coder review may  be revised to meet current compliance  requirements. Andrey Farmer MD, MD 07/22/2020 3:24:53 PM Number of Addenda: 0 Note Initiated On: 07/22/2020 2:57 PM Estimated Blood Loss:  Estimated blood loss was minimal.      [facility]

## 2020-07-22 NOTE — Interval H&P Note (Signed)
History and Physical Interval Note:  07/22/2020 2:53 PM  Keith Reynolds  has presented today for surgery, with the diagnosis of ABNORMAL CT SCAN.  The various methods of treatment have been discussed with the patient and family. After consideration of risks, benefits and other options for treatment, the patient has consented to  Procedure(s): COLONOSCOPY WITH PROPOFOL (N/A) as a surgical intervention.  The patient's history has been reviewed, patient examined, no change in status, stable for surgery.  I have reviewed the patient's chart and labs.  Questions were answered to the patient's satisfaction.     Lesly Rubenstein  Ok to proceed with colonoscopy

## 2020-07-22 NOTE — H&P (Signed)
Outpatient short stay form Pre-procedure 07/22/2020 2:50 PM Raylene Miyamoto MD, MPH  Primary Physician: Dr. Ellison Hughs  Reason for visit:  Abnormal imaging  History of present illness:   80 y/o gentleman with history of hernia repair here for colonoscopy with abnormal imaging of colon concerning for colon cancer. No blood thinners. No family history of GI malignancies.    Current Facility-Administered Medications:  .  0.9 %  sodium chloride infusion, , Intravenous, Continuous, Lian Pounds, Hilton Cork, MD, Last Rate: 20 mL/hr at 07/22/20 1445, Continued from Pre-op at 07/22/20 1445  Medications Prior to Admission  Medication Sig Dispense Refill Last Dose  . atorvastatin (LIPITOR) 10 MG tablet Take 10 mg by mouth daily. (Patient not taking: Reported on 07/22/2020)   Not Taking at Unknown time  . lisinopril (PRINIVIL,ZESTRIL) 5 MG tablet Take 1 tablet (5 mg total) by mouth daily. (Patient not taking: Reported on 07/22/2020) 30 tablet 2 Not Taking at Unknown time     Allergies  Allergen Reactions  . Antihistamines, Diphenhydramine-Type   . Bactrim [Sulfamethoxazole-Trimethoprim] Nausea Only  . Chamomile Other (See Comments)  . Diphenhydramine Hcl Other (See Comments)    dizziness  . Gabapentin   . Levomenol   . Pseudoephedrine Hcl Other (See Comments)    dizziness  . Rosuvastatin   . Triprolidine Hcl Other (See Comments)    dizziness     Past Medical History:  Diagnosis Date  . Fracture    right forearm, tibia/fibula  . Hernia, abdominal   . Hypertension     Review of systems:  Otherwise negative.    Physical Exam  Gen: Alert, oriented. Appears stated age.  HEENT: PERRLA. Lungs: No respiratory distress CV: RRR Abd: soft, benign, no masses Ext: No edema    Planned procedures: Proceed with colonoscopy. The patient understands the nature of the planned procedure, indications, risks, alternatives and potential complications including but not limited to bleeding,  infection, perforation, damage to internal organs and possible oversedation/side effects from anesthesia. The patient agrees and gives consent to proceed.  Please refer to procedure notes for findings, recommendations and patient disposition/instructions.     Raylene Miyamoto MD, MPH Gastroenterology 07/22/2020  2:50 PM

## 2020-07-23 ENCOUNTER — Encounter: Payer: Self-pay | Admitting: Gastroenterology

## 2020-07-24 LAB — SURGICAL PATHOLOGY

## 2020-07-24 NOTE — Anesthesia Postprocedure Evaluation (Signed)
Anesthesia Post Note  Patient: Keith Reynolds  Procedure(s) Performed: COLONOSCOPY WITH PROPOFOL (N/A )  Patient location during evaluation: PACU Anesthesia Type: General Level of consciousness: awake and alert Pain management: pain level controlled Vital Signs Assessment: post-procedure vital signs reviewed and stable Respiratory status: spontaneous breathing, nonlabored ventilation, respiratory function stable and patient connected to nasal cannula oxygen Cardiovascular status: blood pressure returned to baseline and stable Postop Assessment: no apparent nausea or vomiting Anesthetic complications: no   No complications documented.   Last Vitals:  Vitals:   07/22/20 1532 07/22/20 1553  BP: 116/64 140/69  Pulse:    Resp:    Temp:    SpO2:      Last Pain:  Vitals:   07/22/20 1553  TempSrc:   PainSc: 0-No pain                 Molli Barrows

## 2020-08-06 DIAGNOSIS — I517 Cardiomegaly: Secondary | ICD-10-CM | POA: Diagnosis not present

## 2020-08-06 DIAGNOSIS — C189 Malignant neoplasm of colon, unspecified: Secondary | ICD-10-CM | POA: Diagnosis not present

## 2020-08-06 DIAGNOSIS — I2510 Atherosclerotic heart disease of native coronary artery without angina pectoris: Secondary | ICD-10-CM | POA: Diagnosis not present

## 2020-08-06 DIAGNOSIS — C20 Malignant neoplasm of rectum: Secondary | ICD-10-CM | POA: Diagnosis not present

## 2020-08-13 DIAGNOSIS — K6289 Other specified diseases of anus and rectum: Secondary | ICD-10-CM | POA: Diagnosis not present

## 2020-08-13 DIAGNOSIS — C20 Malignant neoplasm of rectum: Secondary | ICD-10-CM | POA: Diagnosis not present

## 2020-08-15 DIAGNOSIS — K6289 Other specified diseases of anus and rectum: Secondary | ICD-10-CM | POA: Diagnosis not present

## 2020-08-15 DIAGNOSIS — R197 Diarrhea, unspecified: Secondary | ICD-10-CM | POA: Diagnosis not present

## 2020-08-15 DIAGNOSIS — C20 Malignant neoplasm of rectum: Secondary | ICD-10-CM | POA: Diagnosis not present

## 2020-08-15 DIAGNOSIS — R634 Abnormal weight loss: Secondary | ICD-10-CM | POA: Diagnosis not present

## 2020-08-19 DIAGNOSIS — D125 Benign neoplasm of sigmoid colon: Secondary | ICD-10-CM | POA: Diagnosis not present

## 2020-08-19 DIAGNOSIS — K6289 Other specified diseases of anus and rectum: Secondary | ICD-10-CM | POA: Diagnosis not present

## 2020-08-29 ENCOUNTER — Other Ambulatory Visit: Payer: Self-pay

## 2020-08-29 ENCOUNTER — Inpatient Hospital Stay
Admission: EM | Admit: 2020-08-29 | Discharge: 2020-09-04 | DRG: 374 | Disposition: A | Discharge status: Skilled Nursing Facility | Payer: PPO | Attending: Internal Medicine | Admitting: Internal Medicine

## 2020-08-29 ENCOUNTER — Inpatient Hospital Stay: Payer: PPO

## 2020-08-29 ENCOUNTER — Inpatient Hospital Stay
Admit: 2020-08-29 | Discharge: 2020-08-29 | Disposition: A | Discharge status: Home / Self Care | Payer: PPO | Attending: Internal Medicine | Admitting: Internal Medicine

## 2020-08-29 DIAGNOSIS — I129 Hypertensive chronic kidney disease with stage 1 through stage 4 chronic kidney disease, or unspecified chronic kidney disease: Secondary | ICD-10-CM | POA: Diagnosis present

## 2020-08-29 DIAGNOSIS — A088 Other specified intestinal infections: Secondary | ICD-10-CM | POA: Diagnosis not present

## 2020-08-29 DIAGNOSIS — C189 Malignant neoplasm of colon, unspecified: Secondary | ICD-10-CM

## 2020-08-29 DIAGNOSIS — Z833 Family history of diabetes mellitus: Secondary | ICD-10-CM

## 2020-08-29 DIAGNOSIS — L8990 Pressure ulcer of unspecified site, unspecified stage: Secondary | ICD-10-CM | POA: Insufficient documentation

## 2020-08-29 DIAGNOSIS — N189 Chronic kidney disease, unspecified: Secondary | ICD-10-CM | POA: Diagnosis not present

## 2020-08-29 DIAGNOSIS — E440 Moderate protein-calorie malnutrition: Secondary | ICD-10-CM | POA: Diagnosis not present

## 2020-08-29 DIAGNOSIS — M79604 Pain in right leg: Secondary | ICD-10-CM

## 2020-08-29 DIAGNOSIS — R197 Diarrhea, unspecified: Secondary | ICD-10-CM

## 2020-08-29 DIAGNOSIS — D649 Anemia, unspecified: Secondary | ICD-10-CM

## 2020-08-29 DIAGNOSIS — R9431 Abnormal electrocardiogram [ECG] [EKG]: Secondary | ICD-10-CM | POA: Diagnosis not present

## 2020-08-29 DIAGNOSIS — E861 Hypovolemia: Secondary | ICD-10-CM | POA: Diagnosis present

## 2020-08-29 DIAGNOSIS — N3289 Other specified disorders of bladder: Secondary | ICD-10-CM | POA: Diagnosis not present

## 2020-08-29 DIAGNOSIS — R531 Weakness: Secondary | ICD-10-CM

## 2020-08-29 DIAGNOSIS — E871 Hypo-osmolality and hyponatremia: Secondary | ICD-10-CM | POA: Diagnosis not present

## 2020-08-29 DIAGNOSIS — R279 Unspecified lack of coordination: Secondary | ICD-10-CM | POA: Diagnosis not present

## 2020-08-29 DIAGNOSIS — E876 Hypokalemia: Secondary | ICD-10-CM

## 2020-08-29 DIAGNOSIS — R600 Localized edema: Secondary | ICD-10-CM | POA: Diagnosis not present

## 2020-08-29 DIAGNOSIS — Z8249 Family history of ischemic heart disease and other diseases of the circulatory system: Secondary | ICD-10-CM

## 2020-08-29 DIAGNOSIS — Z6832 Body mass index (BMI) 32.0-32.9, adult: Secondary | ICD-10-CM | POA: Diagnosis not present

## 2020-08-29 DIAGNOSIS — K6389 Other specified diseases of intestine: Secondary | ICD-10-CM | POA: Diagnosis not present

## 2020-08-29 DIAGNOSIS — D509 Iron deficiency anemia, unspecified: Secondary | ICD-10-CM | POA: Diagnosis present

## 2020-08-29 DIAGNOSIS — E1122 Type 2 diabetes mellitus with diabetic chronic kidney disease: Secondary | ICD-10-CM | POA: Diagnosis not present

## 2020-08-29 DIAGNOSIS — N182 Chronic kidney disease, stage 2 (mild): Secondary | ICD-10-CM | POA: Diagnosis not present

## 2020-08-29 DIAGNOSIS — Z85038 Personal history of other malignant neoplasm of large intestine: Secondary | ICD-10-CM | POA: Diagnosis not present

## 2020-08-29 DIAGNOSIS — D638 Anemia in other chronic diseases classified elsewhere: Secondary | ICD-10-CM | POA: Diagnosis not present

## 2020-08-29 DIAGNOSIS — N1330 Unspecified hydronephrosis: Secondary | ICD-10-CM | POA: Diagnosis present

## 2020-08-29 DIAGNOSIS — Z20822 Contact with and (suspected) exposure to covid-19: Secondary | ICD-10-CM | POA: Diagnosis present

## 2020-08-29 DIAGNOSIS — E878 Other disorders of electrolyte and fluid balance, not elsewhere classified: Secondary | ICD-10-CM | POA: Diagnosis not present

## 2020-08-29 DIAGNOSIS — M7989 Other specified soft tissue disorders: Secondary | ICD-10-CM | POA: Diagnosis not present

## 2020-08-29 DIAGNOSIS — E860 Dehydration: Secondary | ICD-10-CM | POA: Diagnosis present

## 2020-08-29 DIAGNOSIS — K529 Noninfective gastroenteritis and colitis, unspecified: Secondary | ICD-10-CM | POA: Diagnosis not present

## 2020-08-29 DIAGNOSIS — E569 Vitamin deficiency, unspecified: Secondary | ICD-10-CM | POA: Diagnosis not present

## 2020-08-29 DIAGNOSIS — R809 Proteinuria, unspecified: Secondary | ICD-10-CM | POA: Diagnosis not present

## 2020-08-29 DIAGNOSIS — D631 Anemia in chronic kidney disease: Secondary | ICD-10-CM | POA: Diagnosis not present

## 2020-08-29 DIAGNOSIS — R338 Other retention of urine: Secondary | ICD-10-CM | POA: Diagnosis not present

## 2020-08-29 DIAGNOSIS — C187 Malignant neoplasm of sigmoid colon: Principal | ICD-10-CM | POA: Diagnosis present

## 2020-08-29 DIAGNOSIS — E222 Syndrome of inappropriate secretion of antidiuretic hormone: Secondary | ICD-10-CM | POA: Diagnosis present

## 2020-08-29 DIAGNOSIS — R2681 Unsteadiness on feet: Secondary | ICD-10-CM | POA: Diagnosis not present

## 2020-08-29 DIAGNOSIS — Z87891 Personal history of nicotine dependence: Secondary | ICD-10-CM

## 2020-08-29 DIAGNOSIS — M79661 Pain in right lower leg: Secondary | ICD-10-CM | POA: Diagnosis not present

## 2020-08-29 DIAGNOSIS — M79662 Pain in left lower leg: Secondary | ICD-10-CM | POA: Diagnosis not present

## 2020-08-29 DIAGNOSIS — C19 Malignant neoplasm of rectosigmoid junction: Secondary | ICD-10-CM

## 2020-08-29 DIAGNOSIS — F5105 Insomnia due to other mental disorder: Secondary | ICD-10-CM | POA: Diagnosis not present

## 2020-08-29 DIAGNOSIS — Z9852 Vasectomy status: Secondary | ICD-10-CM

## 2020-08-29 DIAGNOSIS — Z736 Limitation of activities due to disability: Secondary | ICD-10-CM | POA: Diagnosis not present

## 2020-08-29 DIAGNOSIS — Z882 Allergy status to sulfonamides status: Secondary | ICD-10-CM

## 2020-08-29 DIAGNOSIS — N400 Enlarged prostate without lower urinary tract symptoms: Secondary | ICD-10-CM | POA: Diagnosis not present

## 2020-08-29 DIAGNOSIS — Z8639 Personal history of other endocrine, nutritional and metabolic disease: Secondary | ICD-10-CM

## 2020-08-29 DIAGNOSIS — M79605 Pain in left leg: Secondary | ICD-10-CM | POA: Diagnosis not present

## 2020-08-29 DIAGNOSIS — E785 Hyperlipidemia, unspecified: Secondary | ICD-10-CM | POA: Diagnosis not present

## 2020-08-29 DIAGNOSIS — R1311 Dysphagia, oral phase: Secondary | ICD-10-CM | POA: Diagnosis not present

## 2020-08-29 DIAGNOSIS — R1930 Abdominal rigidity, unspecified site: Secondary | ICD-10-CM

## 2020-08-29 DIAGNOSIS — R0902 Hypoxemia: Secondary | ICD-10-CM | POA: Diagnosis not present

## 2020-08-29 DIAGNOSIS — N179 Acute kidney failure, unspecified: Secondary | ICD-10-CM | POA: Diagnosis not present

## 2020-08-29 DIAGNOSIS — Z743 Need for continuous supervision: Secondary | ICD-10-CM | POA: Diagnosis not present

## 2020-08-29 DIAGNOSIS — M6281 Muscle weakness (generalized): Secondary | ICD-10-CM | POA: Diagnosis not present

## 2020-08-29 DIAGNOSIS — K639 Disease of intestine, unspecified: Secondary | ICD-10-CM | POA: Diagnosis not present

## 2020-08-29 DIAGNOSIS — I10 Essential (primary) hypertension: Secondary | ICD-10-CM | POA: Diagnosis not present

## 2020-08-29 DIAGNOSIS — Z888 Allergy status to other drugs, medicaments and biological substances status: Secondary | ICD-10-CM

## 2020-08-29 DIAGNOSIS — R638 Other symptoms and signs concerning food and fluid intake: Secondary | ICD-10-CM

## 2020-08-29 DIAGNOSIS — N170 Acute kidney failure with tubular necrosis: Secondary | ICD-10-CM | POA: Diagnosis not present

## 2020-08-29 HISTORY — DX: Hypokalemia: E87.6

## 2020-08-29 HISTORY — DX: Malignant neoplasm of colon, unspecified: C18.9

## 2020-08-29 LAB — IRON AND TIBC
Iron: 64 ug/dL (ref 45–182)
Saturation Ratios: 33 % (ref 17.9–39.5)
TIBC: 193 ug/dL — ABNORMAL LOW (ref 250–450)
UIBC: 129 ug/dL

## 2020-08-29 LAB — GASTROINTESTINAL PANEL BY PCR, STOOL (REPLACES STOOL CULTURE)

## 2020-08-29 LAB — URINALYSIS, COMPLETE (UACMP) WITH MICROSCOPIC
Bilirubin Urine: NEGATIVE
Glucose, UA: NEGATIVE mg/dL
Ketones, ur: 5 mg/dL — AB
Nitrite: NEGATIVE
Protein, ur: 30 mg/dL — AB
Specific Gravity, Urine: 1.011 (ref 1.005–1.030)
pH: 7 (ref 5.0–8.0)

## 2020-08-29 LAB — TROPONIN I (HIGH SENSITIVITY): Troponin I (High Sensitivity): 12 ng/L (ref <18)

## 2020-08-29 LAB — COMPREHENSIVE METABOLIC PANEL
ALT: 14 U/L (ref 0–44)
AST: 22 U/L (ref 15–41)
Albumin: 3 g/dL — ABNORMAL LOW (ref 3.5–5.0)
Alkaline Phosphatase: 64 U/L (ref 38–126)
Anion gap: 13 (ref 5–15)
BUN: 16 mg/dL (ref 8–23)
CO2: 31 mmol/L (ref 22–32)
Calcium: 8.5 mg/dL — ABNORMAL LOW (ref 8.9–10.3)
Chloride: 78 mmol/L — ABNORMAL LOW (ref 98–111)
Creatinine, Ser: 0.41 mg/dL — ABNORMAL LOW (ref 0.61–1.24)
GFR, Estimated: >60 mL/min (ref >60)
Glucose, Bld: 200 mg/dL — ABNORMAL HIGH (ref 70–99)
Potassium: 2.4 mmol/L — CL (ref 3.5–5.1)
Sodium: 122 mmol/L — ABNORMAL LOW (ref 135–145)
Total Bilirubin: 1.4 mg/dL — ABNORMAL HIGH (ref 0.3–1.2)
Total Protein: 6.3 g/dL — ABNORMAL LOW (ref 6.5–8.1)

## 2020-08-29 LAB — RETICULOCYTES
Immature Retic Fract: 26.4 % — ABNORMAL HIGH (ref 2.3–15.9)
RBC.: 4.02 MIL/uL — ABNORMAL LOW (ref 4.22–5.81)
Retic Count, Absolute: 178.1 10*3/uL (ref 19.0–186.0)
Retic Ct Pct: 4.5 % — ABNORMAL HIGH (ref 0.4–3.1)

## 2020-08-29 LAB — BASIC METABOLIC PANEL
Anion gap: 12 (ref 5–15)
Anion gap: 13 (ref 5–15)
BUN: 15 mg/dL (ref 8–23)
BUN: 15 mg/dL (ref 8–23)
CO2: 31 mmol/L (ref 22–32)
CO2: 30 mmol/L (ref 22–32)
Calcium: 8.3 mg/dL — ABNORMAL LOW (ref 8.9–10.3)
Calcium: 8.3 mg/dL — ABNORMAL LOW (ref 8.9–10.3)
Chloride: 81 mmol/L — ABNORMAL LOW (ref 98–111)
Chloride: 80 mmol/L — ABNORMAL LOW (ref 98–111)
Creatinine, Ser: 0.49 mg/dL — ABNORMAL LOW (ref 0.61–1.24)
Creatinine, Ser: 0.48 mg/dL — ABNORMAL LOW (ref 0.61–1.24)
GFR, Estimated: >60 mL/min (ref >60)
GFR, Estimated: >60 mL/min (ref >60)
Glucose, Bld: 202 mg/dL — ABNORMAL HIGH (ref 70–99)
Glucose, Bld: 217 mg/dL — ABNORMAL HIGH (ref 70–99)
Potassium: 3.2 mmol/L — ABNORMAL LOW (ref 3.5–5.1)
Potassium: 3.3 mmol/L — ABNORMAL LOW (ref 3.5–5.1)
Sodium: 124 mmol/L — ABNORMAL LOW (ref 135–145)
Sodium: 123 mmol/L — ABNORMAL LOW (ref 135–145)

## 2020-08-29 LAB — PHOSPHORUS: Phosphorus: 2.4 mg/dL — ABNORMAL LOW (ref 2.5–4.6)

## 2020-08-29 LAB — CBC WITH DIFFERENTIAL/PLATELET
Abs Immature Granulocytes: 0.07 10*3/uL (ref 0.00–0.07)
Basophils Absolute: 0 10*3/uL (ref 0.0–0.1)
Basophils Relative: 0 %
Eosinophils Absolute: 0 10*3/uL (ref 0.0–0.5)
Eosinophils Relative: 0 %
HCT: 31.9 % — ABNORMAL LOW (ref 39.0–52.0)
Hemoglobin: 10.9 g/dL — ABNORMAL LOW (ref 13.0–17.0)
Immature Granulocytes: 1 %
Lymphocytes Relative: 13 %
Lymphs Abs: 1.4 10*3/uL (ref 0.7–4.0)
MCH: 26.5 pg (ref 26.0–34.0)
MCHC: 34.2 g/dL (ref 30.0–36.0)
MCV: 77.6 fL — ABNORMAL LOW (ref 80.0–100.0)
Monocytes Absolute: 1 10*3/uL (ref 0.1–1.0)
Monocytes Relative: 10 %
Neutro Abs: 8 10*3/uL — ABNORMAL HIGH (ref 1.7–7.7)
Neutrophils Relative %: 76 %
Platelets: 252 10*3/uL (ref 150–400)
RBC: 4.11 MIL/uL — ABNORMAL LOW (ref 4.22–5.81)
RDW: 15.5 % (ref 11.5–15.5)
WBC: 10.5 10*3/uL (ref 4.0–10.5)
nRBC: 0 % (ref 0.0–0.2)

## 2020-08-29 LAB — LACTATE DEHYDROGENASE: LDH: 95 U/L — ABNORMAL LOW (ref 98–192)

## 2020-08-29 LAB — CBC
HCT: 31.6 % — ABNORMAL LOW (ref 39.0–52.0)
Hemoglobin: 11 g/dL — ABNORMAL LOW (ref 13.0–17.0)
MCH: 27 pg (ref 26.0–34.0)
MCHC: 34.8 g/dL (ref 30.0–36.0)
MCV: 77.6 fL — ABNORMAL LOW (ref 80.0–100.0)
Platelets: 254 10*3/uL (ref 150–400)
RBC: 4.07 MIL/uL — ABNORMAL LOW (ref 4.22–5.81)
RDW: 15.8 % — ABNORMAL HIGH (ref 11.5–15.5)
WBC: 10.5 10*3/uL (ref 4.0–10.5)
nRBC: 0 % (ref 0.0–0.2)

## 2020-08-29 LAB — HEMOGLOBIN AND HEMATOCRIT, BLOOD
HCT: 35.5 % — ABNORMAL LOW (ref 39.0–52.0)
Hemoglobin: 12.2 g/dL — ABNORMAL LOW (ref 13.0–17.0)

## 2020-08-29 LAB — RESP PANEL BY RT-PCR (FLU A&B, COVID) ARPGX2
Influenza A by PCR: NEGATIVE
Influenza B by PCR: NEGATIVE
SARS Coronavirus 2 by RT PCR: NEGATIVE

## 2020-08-29 LAB — FERRITIN: Ferritin: 441 ng/mL — ABNORMAL HIGH (ref 24–336)

## 2020-08-29 LAB — FOLATE: Folate: 6.6 ng/mL (ref >5.9)

## 2020-08-29 LAB — VITAMIN B12: Vitamin B-12: 303 pg/mL (ref 180–914)

## 2020-08-29 LAB — MAGNESIUM: Magnesium: 1.7 mg/dL (ref 1.7–2.4)

## 2020-08-29 IMAGING — US US EXTREM LOW VENOUS
1 series · 14 of 24 positions shown · non-contrast
Comparison: None.

CLINICAL DATA: Bilateral leg swelling and pain

EXAM:
BILATERAL LOWER EXTREMITY VENOUS DOPPLER ULTRASOUND
TECHNIQUE: Gray-scale sonography with compression, as well as color and duplex
ultrasound, were performed to evaluate the deep venous system(s)
from the level of the common femoral vein through the popliteal and
proximal calf veins.

[Series 1: us venous img lower bilat (dvt) · portal-venous · 14 of 59 slices shown]
[im 1/59]
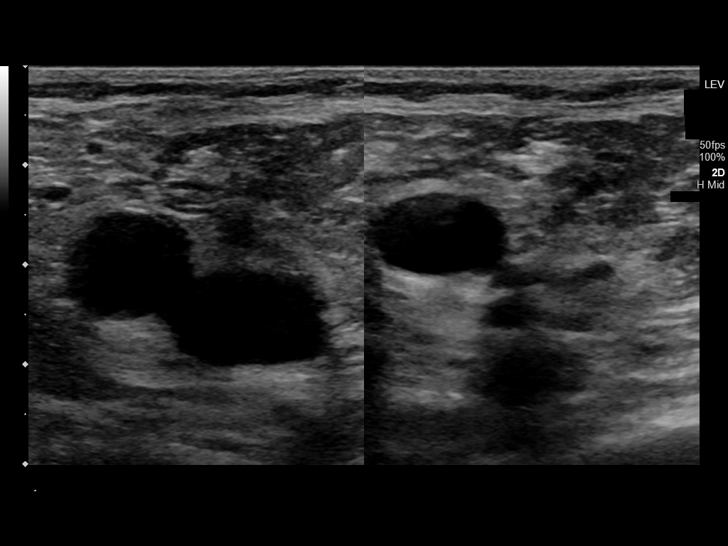
[im 6/59]
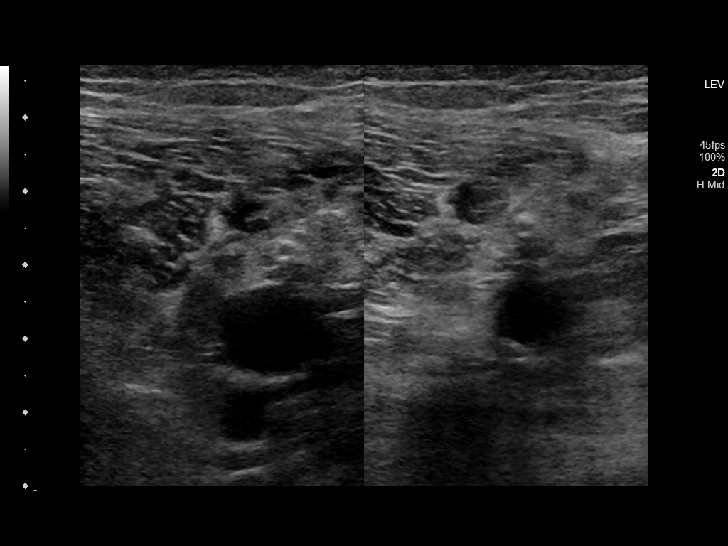
[im 11/59]
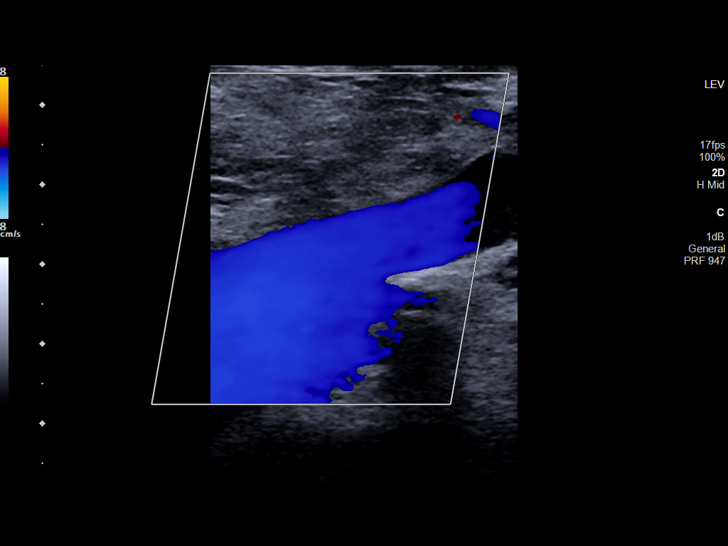
[im 16/59]
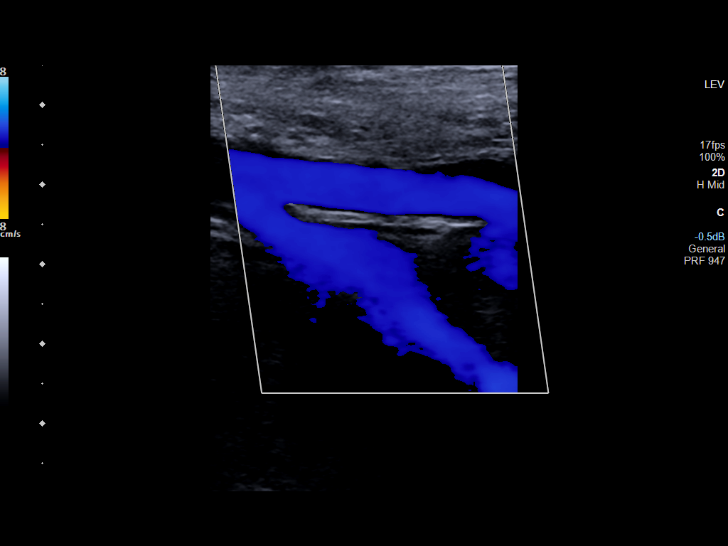
[im 18/59]
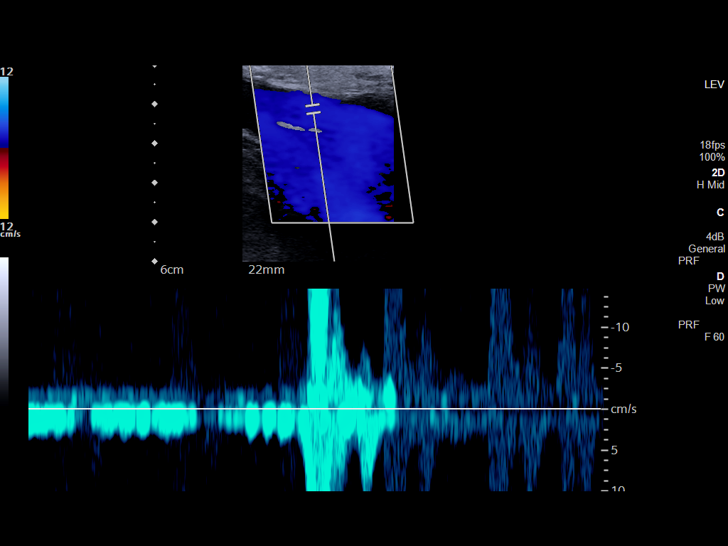
[im 23/59]
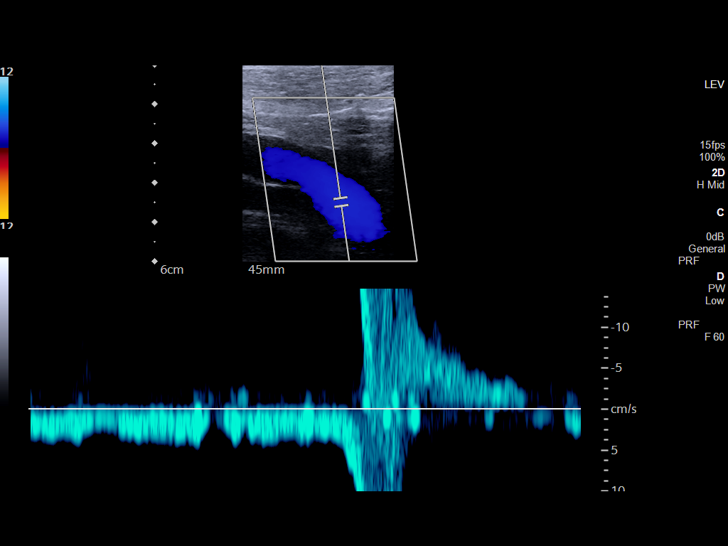
[im 28/59]
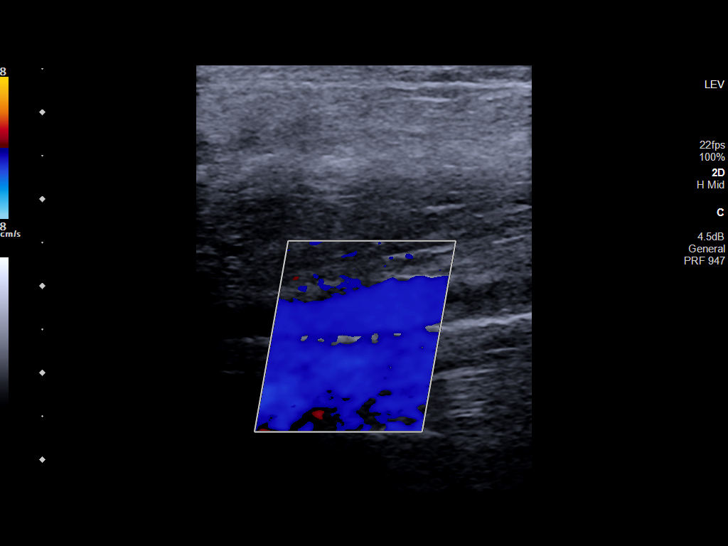
[im 31/59]
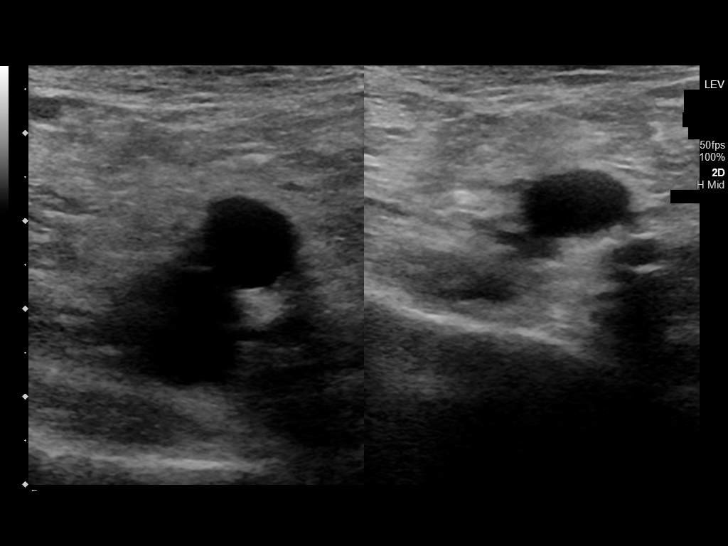
[im 36/59]
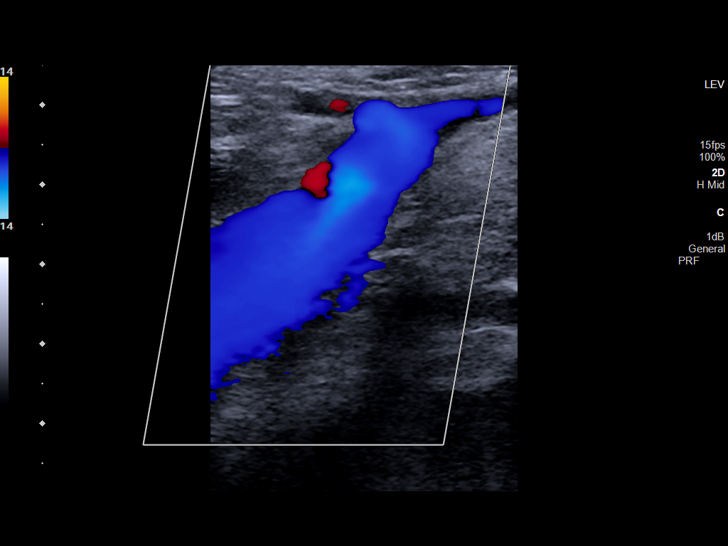
[im 41/59]
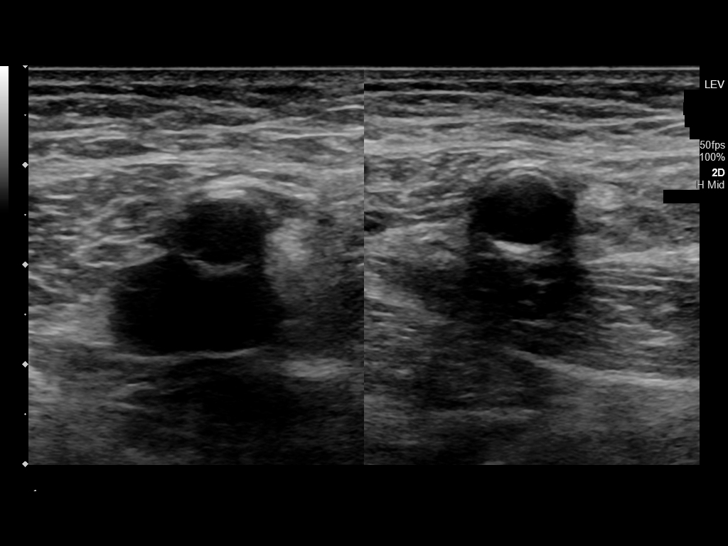
[im 46/59]
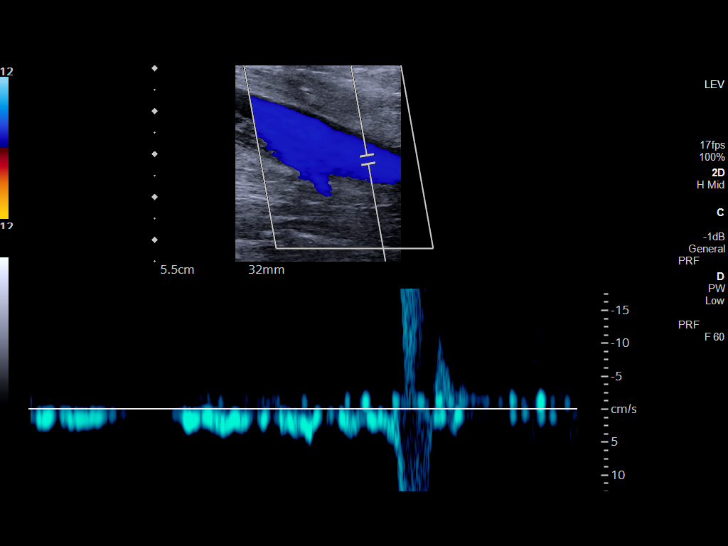
[im 48/59]
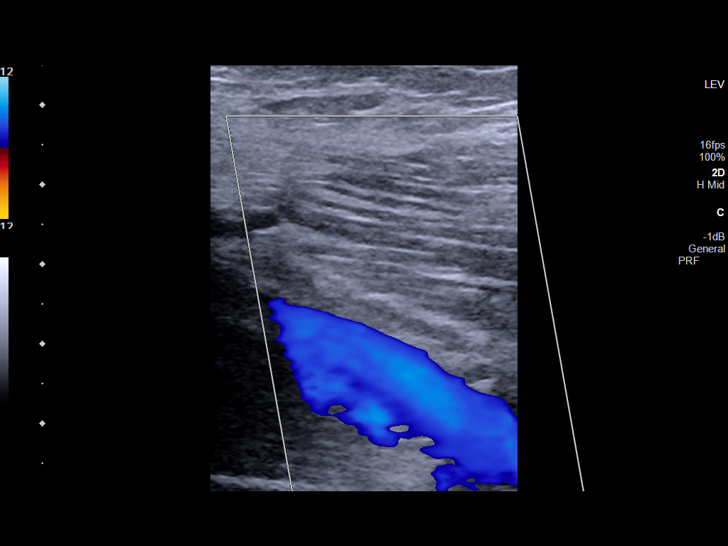
[im 53/59]
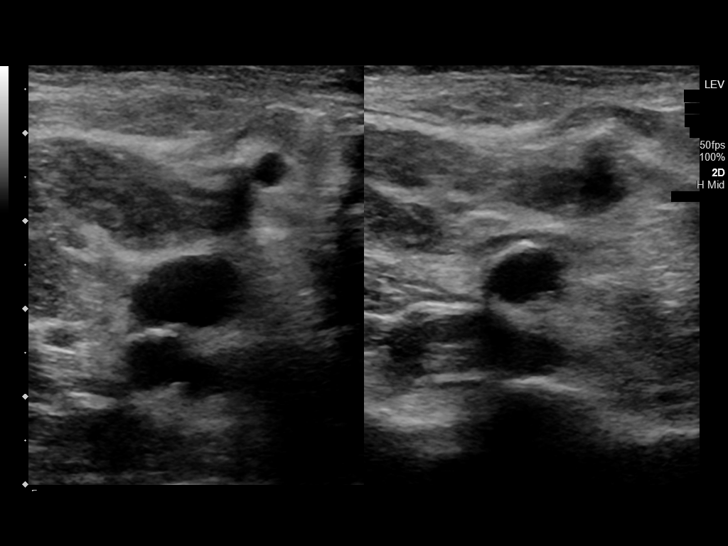
[im 59/59]
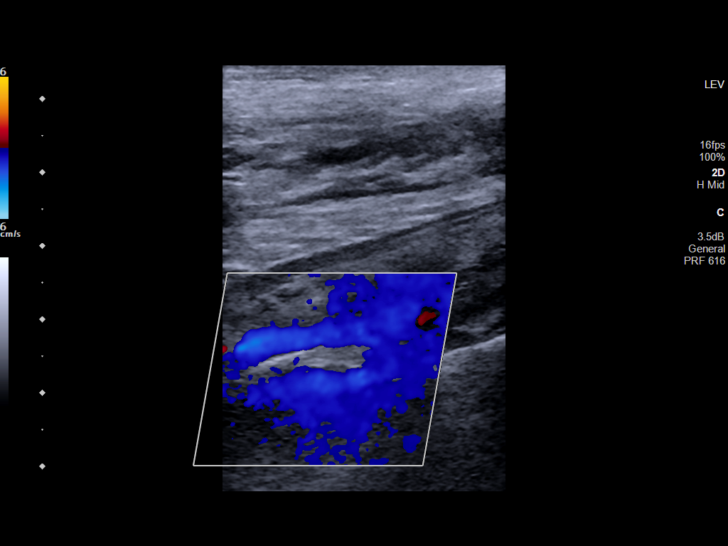

[14 of 24 positions shown; findings below may reference images not displayed]

FINDINGS: VENOUS

Normal compressibility of the common femoral, superficial femoral,
and popliteal veins, as well as the visualized calf veins.
Visualized portions of profunda femoral vein and great saphenous
vein unremarkable. No filling defects to suggest DVT on grayscale or
color Doppler imaging. Doppler waveforms show normal direction of
venous flow, normal respiratory plasticity and response to
augmentation.

OTHER

None.

Limitations: none
IMPRESSION: Negative.

## 2020-08-29 MED ORDER — MORPHINE SULFATE (PF) 2 MG/ML IV SOLN: 2.0000 mg | INTRAVENOUS | Status: DC | PRN

## 2020-08-29 MED ORDER — PROMETHAZINE HCL 25 MG PO TABS
12.5000 mg | ORAL_TABLET | Freq: Four times a day (QID) | ORAL | Status: DC | PRN
  Administered 2020-08-30: 09:00:00 12.5 mg via ORAL
  Filled 2020-08-29 (×2): qty 1

## 2020-08-29 MED ORDER — POTASSIUM CHLORIDE IN NACL 20-0.9 MEQ/L-% IV SOLN
INTRAVENOUS | Status: DC
  Filled 2020-08-29 (×7): qty 1000

## 2020-08-29 MED ORDER — MAGNESIUM HYDROXIDE 400 MG/5ML PO SUSP
30.0000 mL | Freq: Every day | ORAL | Status: DC | PRN
  Filled 2020-08-29: qty 30

## 2020-08-29 MED ORDER — K PHOS MONO-SOD PHOS DI & MONO 155-852-130 MG PO TABS
500.0000 mg | ORAL_TABLET | Freq: Two times a day (BID) | ORAL | Status: DC
  Administered 2020-08-29: 500 mg via ORAL
  Filled 2020-08-29 (×2): qty 2

## 2020-08-29 MED ORDER — MAGNESIUM SULFATE 2 GM/50ML IV SOLN
2.0000 g | Freq: Once | INTRAVENOUS | Status: AC
  Administered 2020-08-29: 2 g via INTRAVENOUS
  Filled 2020-08-29: qty 50

## 2020-08-29 MED ORDER — TRAZODONE HCL 50 MG PO TABS
25.0000 mg | ORAL_TABLET | Freq: Every evening | ORAL | Status: DC | PRN
  Administered 2020-08-30 – 2020-09-02 (×4): 25 mg via ORAL
  Filled 2020-08-29 (×4): qty 1

## 2020-08-29 MED ORDER — ACETAMINOPHEN 325 MG PO TABS
650.0000 mg | ORAL_TABLET | Freq: Four times a day (QID) | ORAL | Status: DC | PRN
  Administered 2020-08-30 – 2020-09-02 (×4): 650 mg via ORAL
  Filled 2020-08-29 (×4): qty 2

## 2020-08-29 MED ORDER — ACETAMINOPHEN 650 MG RE SUPP: 650.0000 mg | Freq: Four times a day (QID) | RECTAL | Status: DC | PRN

## 2020-08-29 MED ORDER — POTASSIUM CHLORIDE CRYS ER 20 MEQ PO TBCR: 40.0000 meq | EXTENDED_RELEASE_TABLET | Freq: Once | ORAL | Status: DC

## 2020-08-29 MED ORDER — NYSTATIN 100000 UNIT/GM EX POWD
Freq: Two times a day (BID) | CUTANEOUS | Status: DC
  Filled 2020-08-29 (×2): qty 15

## 2020-08-29 MED ORDER — LOPERAMIDE HCL 2 MG PO CAPS
4.0000 mg | ORAL_CAPSULE | Freq: Four times a day (QID) | ORAL | Status: DC | PRN
  Administered 2020-08-29: 4 mg via ORAL
  Filled 2020-08-29: qty 2

## 2020-08-29 MED ORDER — POTASSIUM CHLORIDE 10 MEQ/100ML IV SOLN
10.0000 meq | INTRAVENOUS | Status: AC
  Administered 2020-08-29 (×4): 10 meq via INTRAVENOUS
  Filled 2020-08-29 (×3): qty 100

## 2020-08-29 MED ORDER — ENOXAPARIN SODIUM 40 MG/0.4ML IJ SOSY
40.0000 mg | PREFILLED_SYRINGE | INTRAMUSCULAR | Status: DC
  Administered 2020-08-29 – 2020-09-04 (×7): 40 mg via SUBCUTANEOUS
  Filled 2020-08-29 (×7): qty 0.4

## 2020-08-29 MED ORDER — POTASSIUM CHLORIDE 20 MEQ PO PACK
40.0000 meq | PACK | ORAL | Status: AC
  Administered 2020-08-29: 40 meq via ORAL
  Filled 2020-08-29: qty 2

## 2020-08-29 MED ORDER — CYCLOBENZAPRINE HCL 10 MG PO TABS
10.0000 mg | ORAL_TABLET | Freq: Three times a day (TID) | ORAL | Status: DC | PRN
  Administered 2020-08-29 – 2020-08-30 (×4): 10 mg via ORAL
  Filled 2020-08-29 (×4): qty 1

## 2020-08-29 MED ORDER — SODIUM CHLORIDE 0.9 % IV SOLN: Freq: Once | INTRAVENOUS | Status: DC

## 2020-08-29 NOTE — ED Notes (Signed)
Breakfast tray at bedside. MD at bedside assessing patient.

## 2020-08-29 NOTE — ED Notes (Signed)
Meal tray at bedside.  

## 2020-08-29 NOTE — ED Triage Notes (Signed)
EMS brings pt in from home for weakness to legs tonight, fell getting OOB with walker; hx colon CA, tx begins next weak; pt to ED lobby via w/c with no distress

## 2020-08-29 NOTE — H&P (Addendum)
[place]   PATIENT NAME: Keith Reynolds    MR#:  [id number]  DATE OF BIRTH:  1940/07/31  DATE OF ADMISSION:  08/29/2020  PRIMARY CARE PHYSICIAN: Sofie Hartigan, MD   Patient is coming from: Home.  REQUESTING/REFERRING PHYSICIAN: Hinda Kehr, MD  CHIEF COMPLAINT:   Chief Complaint  Patient presents with  . Weakness  . Leg Pain    HISTORY OF PRESENT ILLNESS:  Keith Reynolds is a 80 y.o. Caucasian male with medical history significant for essential hypertension and recently diagnosed colon cancer, for which he will follow at [facility] presented to the ER with acute onset of bilateral lower extremity pain mainly in his legs which has been going on over the last 3 weeks and has significantly worsened tonight with associated cramps in both legs.  While ambulating with his walker his legs give out on him and he fell without injuries.  He is feeling generally weak. Marland Kitchen  He has been having diarrhea with loose bowel movements  which have been going on for 3 weeks however have been improving.  No melena or bright red bleeding per rectum.  No nausea or vomiting.  No chest pain or palpitations.  No dyspnea cough or wheezing.  No dysuria, oliguria or hematuria or flank pain.  ED Course: When he came to the ER blood pressure was 186/93 with otherwise normal vital signs and respiratory rate of 22.  Labs reveal helpful neutropenia and helpful chloremia with a blood glucose of 200, potassium of 2.4 and magnesium 1.7 globulin of 3 and total protein 6.3 with total bili 1.4.  CBC showed anemia compared with 12.1/37.2 on 06/26/2020 EKG as reviewed by me :EKG showed sinus rhythm at a rate of 81 with temperature supraventricular complexes.  The patient was given the following: 40 mill equivalent p.o. potassium chloride and was ordered 60 mEq IV rider, 2 g of IV magnesium sulfate and normal saline at 100 mL/h.  He will be admitted to a medical monitored bed for further evaluation and  management.  PAST MEDICAL HISTORY:   Past Medical History:  Diagnosis Date  . Fracture    right forearm, tibia/fibula  . Hernia, abdominal   . Hypertension     PAST SURGICAL HISTORY:   Past Surgical History:  Procedure Laterality Date  . COLONOSCOPY WITH PROPOFOL N/A 07/19/2020   Procedure: COLONOSCOPY WITH PROPOFOL;  Surgeon: Lesly Rubenstein, MD;  Location: [facility] ENDOSCOPY;  Service: Endoscopy;  Laterality: N/A;  C-19 07/18/2020 AM  . COLONOSCOPY WITH PROPOFOL N/A 07/22/2020   Procedure: COLONOSCOPY WITH PROPOFOL;  Surgeon: Lesly Rubenstein, MD;  Location: [facility] ENDOSCOPY;  Service: Endoscopy;  Laterality: N/A;  . EYE SURGERY    . FRACTURE SURGERY     right forearm, tibia/fibula  . HERNIA REPAIR    . TONSILLECTOMY    . VASECTOMY      SOCIAL HISTORY:   Social History   Tobacco Use  . Smoking status: Former Smoker    Packs/day: 0.25    Quit date: 04/14/1983    Years since quitting: 37.4  . Smokeless tobacco: Never Used  Substance Use Topics  . Alcohol use: No    FAMILY HISTORY:   Family History  Problem Relation Age of Onset  . Heart attack Father   . Diabetes Sister     DRUG ALLERGIES:   Allergies  Allergen Reactions  . Antihistamines, Diphenhydramine-Type   . Bactrim [Sulfamethoxazole-Trimethoprim] Nausea Only  . Chamomile Other (See  Comments)  . Diphenhydramine Hcl Other (See Comments)    dizziness  . Gabapentin   . Levomenol   . Pseudoephedrine Hcl Other (See Comments)    dizziness  . Rosuvastatin   . Triprolidine Hcl Other (See Comments)    dizziness    REVIEW OF SYSTEMS:   ROS As per history of present illness. All pertinent systems were reviewed above. Constitutional, HEENT, cardiovascular, respiratory, GI, GU, musculoskeletal, neuro, psychiatric, endocrine, integumentary and hematologic systems were reviewed and are otherwise negative/unremarkable except for positive findings mentioned above in the HPI.   MEDICATIONS AT HOME:   Prior  to Admission medications   Medication Sig Start Date End Date Taking? Authorizing Provider  atorvastatin (LIPITOR) 10 MG tablet Take 10 mg by mouth daily. Patient not taking: Reported on 07/22/2020    [provider]  lisinopril (PRINIVIL,ZESTRIL) 5 MG tablet Take 1 tablet (5 mg total) by mouth daily. Patient not taking: Reported on 07/22/2020 05/28/17   Adline Potter, MD      VITAL SIGNS:  Blood pressure (!) 146/70, pulse 79, temperature 98.1 F (36.7 C), resp. rate (!) 21, height 5\' 7"  (1.702 m), weight 63.5 kg, SpO2 94 %.  PHYSICAL EXAMINATION:  Physical Exam  GENERAL:  80 y.o.-year-old Caucasian male patient lying in the bed with no acute distress.  EYES: Pupils equal, round, reactive to light and accommodation. No scleral icterus. Extraocular muscles intact.  HEENT: Head atraumatic, normocephalic. Oropharynx and nasopharynx clear.  NECK:  Supple, no jugular venous distention. No thyroid enlargement, no tenderness.  LUNGS: Normal breath sounds bilaterally, no wheezing, rales,rhonchi or crepitation. No use of accessory muscles of respiration.  CARDIOVASCULAR: Regular rate and rhythm, S1, S2 normal. No murmurs, rubs, or gallops.  ABDOMEN: Soft, nondistended, nontender. Bowel sounds present. No organomegaly or mass.  EXTREMITIES: 1+ lower extremity pitting edema, with no cyanosis, or clubbing.  NEUROLOGIC: Cranial nerves II through XII are intact. Muscle strength 5/5 in all extremities. Sensation intact. Gait not checked.  PSYCHIATRIC: The patient is alert and oriented x 3.  Normal affect and good eye contact. SKIN: No obvious rash, lesion, or ulcer.   LABORATORY PANEL:   CBC Recent Labs  Lab 08/29/20 0143  WBC 10.5  HGB 10.9*  HCT 31.9*  PLT 252   ------------------------------------------------------------------------------------------------------------------  Chemistries  Recent Labs  Lab 08/29/20 0143  NA 122*  K 2.4*  CL 78*  CO2 31  GLUCOSE 200*  BUN 16   CREATININE 0.41*  CALCIUM 8.5*  MG 1.7  AST 22  ALT 14  ALKPHOS 64  BILITOT 1.4*   ------------------------------------------------------------------------------------------------------------------  Cardiac Enzymes No results for input(s): TROPONINI in the last 168 hours. ------------------------------------------------------------------------------------------------------------------  RADIOLOGY:  No results found.    IMPRESSION AND PLAN:  Active Problems:   Hypokalemia  1.  Generalized weakness secondary to severe hypokalemia and anemia as well as diarrhea with associated bilateral leg pain. - The patient will be admitted to a medically monitored bed. - Pain management will be provided. - Physical therapy consult to be obtained for ambulation. - Potassium will be aggressively replaced and magnesium level will be checked. - The patient will be hydrated with IV normal saline. - Pain management to be provided. - Bilateral lower extremity venous Doppler will be obtained given edema and pain.  2.  Hyponatremia and hypochloremia. - This is likely secondary to volume depletion hydration. -The patient will be hydrated with IV normal saline and will follow his BMP.  3.  Anemia with history of recently  diagnosed colon cancer. - We will follow serial hemoglobins and hematocrits. - Anemia work-up will be obtained  4.  Dyslipidemia. - Statin therapy will be resumed  DVT prophylaxis: Lovenox. Code Status: full code.  Family Communication:  The plan of care was discussed in details with the patient (and family). I answered all questions. The patient agreed to proceed with the above mentioned plan. Further management will depend upon hospital course. Disposition Plan: Back to previous home environment Consults called: none. All the records are reviewed and case discussed with ED provider.  Status is: Inpatient  Remains inpatient appropriate because:Ongoing active pain  requiring inpatient pain management, Ongoing diagnostic testing needed not appropriate for outpatient work up, Unsafe d/c plan, IV treatments appropriate due to intensity of illness or inability to take PO and Inpatient level of care appropriate due to severity of illness   Dispo: The patient is from: Home              Anticipated d/c is to: Home              Patient currently is not medically stable to d/c.   Difficult to place patient No   TOTAL TIME TAKING CARE OF THIS PATIENT: 55 minutes.    Christel Mormon M.D on 08/29/2020 at 4:00 AM  [facility]   From 7 PM-7 AM, contact night-coverage [email or web address]  CC: Primary care physician; Sofie Hartigan, MD

## 2020-08-29 NOTE — ED Notes (Signed)
Patient assisted on bedpan. Patient noted to have small amount of runny stool-sample sent to lab. Patient cleaned.

## 2020-08-29 NOTE — ED Notes (Signed)
Pt up to restroom with aid of walker.  Pt extremely weak and unsteady with ambulation.

## 2020-08-29 NOTE — ED Provider Notes (Signed)
[facility] Emergency Department Provider Note  ____________________________________________   Event Date/Time   First MD Initiated Contact with Patient 08/29/20 [phone]     (approximate)  I have reviewed the triage vital signs and the nursing notes.   HISTORY  Chief Complaint Weakness and Leg Pain    HPI Keith Reynolds is a 80 y.o. male with a relatively recent diagnosis of colon cancer for which he has been referred to [facility] but not yet started treatment.  He has also been having chronic diarrhea for about a month.  He presents for gradually worsening bilateral lower leg pain that has become significantly worse tonight.  Additionally, in addition to the aching and cramping pain in both of his legs, when he tried to walk with his walker tonight, both of his legs gave out on him.  He feels generalized weakness that has become severe and nothing in particular makes it better or worse.  He said he is having numerous loose bowel movements multiple times a day which is one of the reasons he went to a gastroenterologist and was given the diagnosis of colon cancer for which he has been referred to [facility].  He said that he is not having any bowel incontinence but that he has to get up and go to the bathroom many times a day and now his legs will not support him.  He has not had any nausea or vomiting.  He denies fever, chest pain, shortness of breath.  He has no history of blood clots but he has had some increased swelling in his right leg and foot greater than left.        Past Medical History:  Diagnosis Date  . Colon cancer [facility])    reported by patient and wife  . Fracture    right forearm, tibia/fibula  . Hernia, abdominal   . Hypertension     Patient Active Problem List   Diagnosis Date Noted  . Hypokalemia 08/29/2020  . Prediabetes 06/03/2017  . Hypertension 05/28/2017  . Heart murmur, systolic [id number]  . History of diabetes mellitus 05/28/2017  .  Obesity (BMI 30-39.9) 05/28/2017  . Gait instability 05/28/2017  . Chronic venous stasis 05/28/2017    Past Surgical History:  Procedure Laterality Date  . COLONOSCOPY WITH PROPOFOL N/A 07/19/2020   Procedure: COLONOSCOPY WITH PROPOFOL;  Surgeon: Lesly Rubenstein, MD;  Location: [facility] ENDOSCOPY;  Service: Endoscopy;  Laterality: N/A;  C-19 07/18/2020 AM  . COLONOSCOPY WITH PROPOFOL N/A 07/22/2020   Procedure: COLONOSCOPY WITH PROPOFOL;  Surgeon: Lesly Rubenstein, MD;  Location: [facility] ENDOSCOPY;  Service: Endoscopy;  Laterality: N/A;  . EYE SURGERY    . FRACTURE SURGERY     right forearm, tibia/fibula  . HERNIA REPAIR    . TONSILLECTOMY    . VASECTOMY      Prior to Admission medications   Medication Sig Start Date End Date Taking? Authorizing Provider  atorvastatin (LIPITOR) 10 MG tablet Take 10 mg by mouth daily. Patient not taking: Reported on 07/22/2020    [provider]  lisinopril (PRINIVIL,ZESTRIL) 5 MG tablet Take 1 tablet (5 mg total) by mouth daily. Patient not taking: Reported on 07/22/2020 05/28/17   Adline Potter, MD    Allergies Antihistamines, diphenhydramine-type; Bactrim [sulfamethoxazole-trimethoprim]; Chamomile; Diphenhydramine hcl; Gabapentin; Levomenol; Pseudoephedrine hcl; Rosuvastatin; and Triprolidine hcl  Family History  Problem Relation Age of Onset  . Heart attack Father   . Diabetes Sister     Social History Social History  Tobacco Use  . Smoking status: Former Smoker    Packs/day: 0.25    Quit date: 04/14/1983    Years since quitting: 37.4  . Smokeless tobacco: Never Used  Vaping Use  . Vaping Use: Never used  Substance Use Topics  . Alcohol use: No  . Drug use: No    Review of Systems Constitutional: No fever/chills Eyes: No visual changes. ENT: No sore throat. Cardiovascular: Denies chest pain. Respiratory: Denies shortness of breath. Gastrointestinal: No abdominal pain.  No nausea, no vomiting.  No diarrhea.  No  constipation. Genitourinary: Negative for dysuria. Musculoskeletal: Positive for Generalized weakness.  negative for neck pain.  Negative for back pain. Integumentary: Negative for rash. Neurological: Negative for headaches, focal weakness or numbness.   ____________________________________________   PHYSICAL EXAM:  VITAL SIGNS: ED Triage Vitals  Enc Vitals Group     BP 08/29/20 0125 (!) 186/93     Pulse Rate 08/29/20 0123 96     Resp 08/29/20 0123 20     Temp 08/29/20 0123 98.1 F (36.7 C)     Temp src --      SpO2 08/29/20 0117 98 %     Weight 08/29/20 0125 63.5 kg (140 lb)     Height 08/29/20 0125 1.702 m (5\' 7" )     Head Circumference --      Peak Flow --      Pain Score 08/29/20 0124 10     Pain Loc --      Pain Edu? --      Excl. in [place]? --     Constitutional: Alert and oriented.  Eyes: Conjunctivae are normal.  Head: Atraumatic. Nose: No congestion/rhinnorhea. Mouth/Throat: Patient is wearing a mask. Neck: No stridor.  No meningeal signs.   Cardiovascular: Normal rate, regular rhythm. Good peripheral circulation. Respiratory: Normal respiratory effort.  No retractions. Gastrointestinal: Soft and nontender. No distention.  Musculoskeletal: The patient has no gross abnormalities nor gross differences in strength between his upper extremities nor lower extremities.  He appears to have some swelling in his right foot and his right leg compared to the left but it is difficult to appreciate if this is baseline. Neurologic:  Normal speech and language. No gross focal neurologic deficits are appreciated.  He seems to have decreased strength in his legs but is difficult to appreciate if this is secondary to effort but it does not appear consistent with CVA. Skin:  Skin is warm, dry and intact.  He has some chronic skin thickening and darkening consistent with decreased venous return and his legs, more notable on the right foot. Psychiatric: Mood and affect are normal. Speech  and behavior are normal.  ____________________________________________   LABS (all labs ordered are listed, but only abnormal results are displayed)  Labs Reviewed  CBC WITH DIFFERENTIAL/PLATELET - Abnormal; Notable for the following components:      Result Value   RBC 4.11 (*)    Hemoglobin 10.9 (*)    HCT 31.9 (*)    MCV 77.6 (*)    Neutro Abs 8.0 (*)    All other components within normal limits  COMPREHENSIVE METABOLIC PANEL - Abnormal; Notable for the following components:   Sodium 122 (*)    Potassium 2.4 (*)    Chloride 78 (*)    Glucose, Bld 200 (*)    Creatinine, Ser 0.41 (*)    Calcium 8.5 (*)    Total Protein 6.3 (*)    Albumin 3.0 (*)    Total  Bilirubin 1.4 (*)    All other components within normal limits  RESP PANEL BY RT-PCR (FLU A&B, COVID) ARPGX2  MAGNESIUM  URINALYSIS, COMPLETE (UACMP) WITH MICROSCOPIC  CBC  BASIC METABOLIC PANEL  TROPONIN I (HIGH SENSITIVITY)   ____________________________________________  EKG  ED ECG REPORT I, Hinda Kehr, the attending physician, personally viewed and interpreted this ECG.  Date: 08/29/2020 EKG Time: 1:42 AM Rate: 81 Rhythm: normal sinus rhythm QRS Axis: normal Intervals: normal ST/T Wave abnormalities: Non-specific ST segment / T-wave changes, but no clear evidence of acute ischemia. Narrative Interpretation: no definitive evidence of acute ischemia; does not meet STEMI criteria.  There is quite a bit of artifact in the background of the EKG.   ____________________________________________  RADIOLOGY Ursula Alert, personally viewed and evaluated these images (plain radiographs) as part of my medical decision making, as well as reviewing the written report by the radiologist.  ED MD interpretation: Bilateral lower extremity ultrasound pending at the time of admission and signing of the note.  Official radiology report(s): No results  found.  ____________________________________________   PROCEDURES   Procedure(s) performed (including Critical Care):  .1-3 Lead EKG Interpretation Performed by: Hinda Kehr, MD Authorized by: Hinda Kehr, MD     Interpretation: normal     ECG rate:  80   ECG rate assessment: normal     Rhythm: sinus rhythm     Ectopy: none     Conduction: normal       ____________________________________________   INITIAL IMPRESSION / MDM / ASSESSMENT AND PLAN / ED COURSE  As part of my medical decision making, I reviewed the following data within the [place] notes reviewed and incorporated, Labs reviewed , EKG interpreted , Old chart reviewed, Discussed with admitting physician  and Notes from prior ED visits   Differential diagnosis includes, but is not limited to, electrolyte or metabolic abnormality, DVT, acute infection, CVA.  The patient is on the cardiac monitor to evaluate for evidence of arrhythmia and/or significant heart rate changes.  The patient's physical exam is reassuring with no evidence to suggest CVA.  His symptoms seem to be gradually worsening over an extended period of time.  His vital signs are stable.  No evidence of ischemia on his EKG although there is quite a bit of background artifact.  His labs are most notable for a potassium at 2.4 which is significantly changed from the prior potassium on record.  I believe this is due to his chronic diarrhea.  His sodium is also decreased at 122.  Most likely both of these issues are causing his generalized weakness.    He meets criteria for inpatient treatment.  I ordered potassium 40 mill equivalents by mouth and 10 mill equivalents per hour by IV in addition to magnesium 2 g IV to help with potassium absorption.  He and his wife are comfortable with the plan for admission.     Clinical Course as of 08/29/20 0542  Thu Aug 29, 2020  0355 Discussed case by phone with Dr. Sidney Ace with the  hospitalist service and he will admit. [CF]  2458 I also reported to him that the bilateral lower extremity venous ultrasound is pending and will need follow-up and he understands and agrees. [CF]    Clinical Course User Index [CF] Hinda Kehr, MD     ____________________________________________  FINAL CLINICAL IMPRESSION(S) / ED DIAGNOSES  Final diagnoses:  Hypokalemia  Generalized weakness  Chronic diarrhea  Bilateral leg pain  Hyponatremia  MEDICATIONS GIVEN DURING THIS VISIT:  Medications  magnesium sulfate IVPB 2 g 50 mL (2 g Intravenous New Bag/Given 08/29/20 0442)  potassium chloride 10 mEq in 100 mL IVPB (10 mEq Intravenous New Bag/Given 08/29/20 0443)  0.9 %  sodium chloride infusion ( Intravenous Not Given 08/29/20 0444)  enoxaparin (LOVENOX) injection 40 mg (has no administration in time range)  0.9 % NaCl with KCl 20 mEq/ L  infusion ( Intravenous New Bag/Given 08/29/20 0442)  acetaminophen (TYLENOL) tablet 650 mg (has no administration in time range)    Or  acetaminophen (TYLENOL) suppository 650 mg (has no administration in time range)  traZODone (DESYREL) tablet 25 mg (has no administration in time range)  magnesium hydroxide (MILK OF MAGNESIA) suspension 30 mL (has no administration in time range)  promethazine (PHENERGAN) tablet 12.5 mg (has no administration in time range)  potassium chloride (KLOR-CON) packet 40 mEq (40 mEq Oral Given 08/29/20 0444)     ED Discharge Orders    None      *Please note:  Keith Reynolds was evaluated in Emergency Department on 08/29/2020 for the symptoms described in the history of present illness. He was evaluated in the context of the global COVID-19 pandemic, which necessitated consideration that the patient might be at risk for infection with the SARS-CoV-2 virus that causes COVID-19. Institutional protocols and algorithms that pertain to the evaluation of patients at risk for COVID-19 are in a state of rapid change  based on information released by regulatory bodies including the CDC and federal and state organizations. These policies and algorithms were followed during the patient's care in the ED.  Some ED evaluations and interventions may be delayed as a result of limited staffing during and after the pandemic.*  Note:  This document was prepared using Dragon voice recognition software and may include unintentional dictation errors.   Hinda Kehr, MD 08/29/20 [phone]

## 2020-08-29 NOTE — ED Triage Notes (Signed)
Pt states he has had leg pain since last October, worse tonight with weakness. Pt had mechanical fall due to his legs giving out, swelling noted to legs bilaterally.

## 2020-08-29 NOTE — Progress Notes (Signed)
PROGRESS NOTE  Keith Reynolds  DOB: 04-Aug-1940  PCP: Sofie Hartigan, MD JKK:[id number]  DOA: 08/29/2020  LOS: 0 days  Hospital Day: 1   Chief Complaint  Patient presents with  . Weakness  . Leg Pain    Brief narrative: Keith Reynolds is a 80 y.o. male with PMH significant for essential hypertension and recently diagnosed colon cancer, for which he has a pending follow up at [facility]. Patient presented to the ED late last night with complaint of acute onset bilateral lower extremity pain, progressively worsening for last 3 weeks associated with cramps in hands impaired mobility.  He has been having diarrhea for last 3 weeks with generalized weakness.  In the ED, blood pressure was elevated to 186/93 Labs showed sodium level low at 122, potassium low at 2.4, blood glucose level at 200, hemoglobin at 10.9, low compared to 12.1 in March.  WBC 10.5. Ultrasound duplex of both lower extremities were normal.   Patient was given potassium replacement, magnesium replacement Admitted to hospitalist service.  Subjective: Patient was seen and examined this morning.  He was noted to continue.  Found in bed.  Distress because of persistent diarrhea Daughter at bedside. Chart reviewed Remains hemodynamically stable. Repeat labs not available this morning.  Assessment/Plan: Persistent diarrhea -Infectious versus secretory in the setting of recent diagnosis of colon cancer. -No fever, normal white count.  Ordered for GI pathogen panel. -Continue IV hydration  Severe hypokalemia Hypomagnesemia Hypophosphatemia -Secondary to persistent diarrhea.  IV and oral replacement.  -Repeat labs tomorrow. Recent Labs  Lab 08/29/20 0143 08/29/20 0914 08/29/20 1042  K 2.4* 3.2* 3.3*  MG 1.7  --   --   PHOS  --  2.4*  --    Severe hyponatremia -Sodium level low at 122 at presentation.  Likely hypovolemic in the setting of diarrhea.  Expect improvement with hydration.  Repeat  labs tomorrow Recent Labs  Lab 08/29/20 0143 08/29/20 0914 08/29/20 1042  NA 122* 124* 123*   Generalized weakness Muscle cramps -Probably due to electrolyte abnormalities and dehydration.  Flexeril for muscle spasm.\ -Encourage ambulation.  PT eval. -Ultrasound duplex bilateral legs negative for DVT.  Recent diagnosis of colon cancer -Noted a plan to follow-up at [facility].  Dropping hemoglobin -Hemoglobin was 12.1 on March, presented with slight drop of 10.9.  MCV low at 78.  Adequate ferritin. Recent Labs    08/29/20 0143 08/29/20 0914  HGB 10.9* 11.0*  MCV 77.6* 77.6*  FOLATE 6.6  --   FERRITIN 441*  --   TIBC 193*  --   IRON 64  --   RETICCTPCT 4.5*  --    Dyslipidemia -Statin  Bilateral pedal edema -Intermittent for last several months.  More prominent now.  Albumin level at 3. -Ultrasound duplex lower extremities negative.  Obtain echocardiogram.  Mobility: Encourage oral ambulation once spasm improves Code Status:   Code Status: Full Code  Nutritional status: Body mass index is 21.93 kg/m.     Diet Order            Diet full liquid Room service appropriate? Yes; Fluid consistency: Thin  Diet effective now                 DVT prophylaxis: enoxaparin (LOVENOX) injection 40 mg Start: 08/29/20 0800   Antimicrobials:  None Fluid: Normal saline with potassium at 100 mill per hour Consultants: None Family Communication:  Daughter at bedside  Status is: Inpatient  Remains inpatient appropriate because:  Continue monitor for diarrhea, continue to monitor electrolytes, continue IV hydration  Dispo: The patient is from: Home              Anticipated d/c is to: Home in 2 to 3 days              Patient currently is not medically stable to d/c.   Difficult to place patient No     Infusions:  . 0.9 % NaCl with KCl 20 mEq / L 100 mL/hr at 08/29/20 0442    Scheduled Meds: . enoxaparin (LOVENOX) injection  40 mg Subcutaneous Q24H  . nystatin   Topical  BID  . phosphorus  500 mg Oral BID WC    Antimicrobials: Anti-infectives (From admission, onward)   None      PRN meds: acetaminophen **OR** acetaminophen, cyclobenzaprine, loperamide, magnesium hydroxide, morphine injection, promethazine, traZODone   Objective: Vitals:   08/29/20 1130 08/29/20 1330  BP: (!) 162/85 (!) 152/88  Pulse: 82   Resp: 19 18  Temp:    SpO2: 95%     Intake/Output Summary (Last 24 hours) at 08/29/2020 1414 Last data filed at 08/29/2020 1016 Gross per 24 hour  Intake 250 ml  Output --  Net 250 ml   Filed Weights   08/29/20 0125  Weight: 63.5 kg   Weight change:  Body mass index is 21.93 kg/m.   Physical Exam: General exam: Pleasant, elderly Caucasian male. Skin: No rashes, lesions or ulcers. HEENT: Atraumatic, normocephalic, no obvious bleeding Lungs: Clear to auscultation bilaterally CVS: Regular rate and rhythm, no murmur GI/Abd abdomen mass and tenderness related to cancer CNS: Alert, awake, oriented x3.  Hard of hearing Psychiatry: Mood appropriate Extremities: 1+ bilateral pedal edema, no calf tenderness  Data Review: I have personally reviewed the laboratory data and studies available.  Recent Labs  Lab 08/29/20 0143 08/29/20 0914  WBC 10.5 10.5  NEUTROABS 8.0*  --   HGB 10.9* 11.0*  HCT 31.9* 31.6*  MCV 77.6* 77.6*  PLT 252 254   Recent Labs  Lab 08/29/20 0143 08/29/20 0914 08/29/20 1042  NA 122* 124* 123*  K 2.4* 3.2* 3.3*  CL 78* 81* 80*  CO2 31 31 30   GLUCOSE 200* 202* 217*  BUN 16 15 15   CREATININE 0.41* 0.49* 0.48*  CALCIUM 8.5* 8.3* 8.3*  MG 1.7  --   --   PHOS  --  2.4*  --     F/u labs ordered Unresulted Labs (From admission, onward)          Start     Ordered   08/30/20 0500  CBC with Differential/Platelet  Daily,   STAT      08/29/20 1410   08/30/20 1696  Basic metabolic panel  Daily,   STAT      08/29/20 1410   08/30/20 0500  Magnesium  Tomorrow morning,   STAT        08/29/20 1410    08/30/20 0500  Phosphorus  Tomorrow morning,   STAT        08/29/20 1410   08/29/20 0543  Hemoglobin and hematocrit, blood  Now then every 6 hours,   STAT      08/29/20 0543   08/29/20 0543  Vitamin B12  Add-on,   AD        08/29/20 0543   08/29/20 0134  Urinalysis, Complete w Microscopic  Once,   STAT        08/29/20 0134   Unscheduled  Occult  blood card to lab, stool  As needed,   STAT      08/29/20 0543          Signed, Terrilee Croak, MD [facility] 08/29/2020

## 2020-08-29 NOTE — Evaluation (Signed)
Physical Therapy Evaluation Patient Details Name: Keith Reynolds MRN: [id number] DOB: Nov 21, 1940 Today's Date: 08/29/2020   History of Present Illness  presented to ER secondary to progressive weakness to bilat LEs, with fall prior to admission; admitted for management of severe hypokalemia, anemia and persistent diarrhea.  Of note, scheduled to start treatment for newly-diagnosed colon CA on Monday, 09/02/20.  Clinical Impression  Upon evaluation, patient alert and oriented; follows commands and agreeable to participation with session.  Generally weak and fatigued; endorsing pain/soreness to bilat LEs (R knee area, L hip area).  Globally weak and deconditioned throughout all extremities, requiring physical assist for all functional transfers; however, no focal weakness appreciated.  Currently requiring mod assist for bed mobility; min/mod assist for sit/stand, basic transfers and gait with RW.  Demonstrates laterally stepping edge of bed with RW; very short, shuffling steps; poor balance, poor standing tolerance.  Unsafe to attempt without RW and +1 assist at all times.  distance limited by generalized pain and fatigue with exertional activities Orthostatic vitals assessment-supine and sitting vitals grossly WFL; unable to tolerate standing for duration necessary to achieve full BP measurement.  Does endorse dizziness/lightheadedness with transition from supine to sit, but minimal change in BP noted.  Will continue to monitor in subsequent sessions as appropriate. Would benefit from skilled PT to address above deficits and promote optimal return to PLOF.; recommend transition to STR upon discharge from acute hospitalization.     Follow Up Recommendations SNF    Equipment Recommendations       Recommendations for Other Services       Precautions / Restrictions Precautions Precautions: Fall Restrictions Weight Bearing Restrictions: No      Mobility  Bed Mobility Overal bed mobility:  Needs Assistance Bed Mobility: Supine to Sit;Sit to Supine     Supine to sit: Mod assist Sit to supine: Mod assist   General bed mobility comments: assist for LE management, truncal elevation; mod reports of pain to bilat LEs with movement transition    Transfers Overall transfer level: Needs assistance Equipment used: Rolling walker (2 wheeled) Transfers: Sit to/from Stand Sit to Stand: Min assist         General transfer comment: requires bilat UE support on RW; forward flexed posture; sustained flexion at bilat hips/knees.  Poor balance, poor standing tolerance.  Ambulation/Gait Ambulation/Gait assistance: Min assist;Mod assist Gait Distance (Feet): 5 Feet Assistive device: Rolling walker (2 wheeled)       General Gait Details: laterally stepping edge of bed with RW; very short, shuffling steps; poor balance, poor standing tolerance.  Unsafe to attempt without RW and +1 assist at all times.  distance limited by generalized pain and fatigue with exertional activities  Stairs            Wheelchair Mobility    Modified Rankin (Stroke Patients Only)       Balance Overall balance assessment: Needs assistance Sitting-balance support: Feet supported;No upper extremity supported Sitting balance-Leahy Scale: Fair Sitting balance - Comments: lists posteriorally with farigue, limited awareness and attempts at self-correction Postural control: Posterior lean Standing balance support: Bilateral upper extremity supported Standing balance-Leahy Scale: Poor                               Pertinent Vitals/Pain Pain Assessment: Faces Faces Pain Scale: Hurts little more Pain Location: bilat LEs Pain Descriptors / Indicators: Aching;Grimacing Pain Intervention(s): Limited activity within patient's tolerance;Monitored during session;Repositioned  Home Living Family/patient expects to be discharged to:: Private residence Living Arrangements:  Spouse/significant other Available Help at Discharge: Family;Available 24 hours/day Type of Home: House Home Access: Stairs to enter Entrance Stairs-Rails: Psychiatric nurse of Steps: 3+1 Home Layout: Two level;Able to live on main level with bedroom/bathroom Home Equipment: Walker - 4 wheels      Prior Function Level of Independence: Independent         Comments: Indep with ADLs, household mobilization without assist device, recent use of RW over past couple weeks due to progressive weakness.  Does endorse at least 2 falls in previous 2 weeks prior to admission.     Hand Dominance        Extremity/Trunk Assessment   Upper Extremity Assessment Upper Extremity Assessment: Generalized weakness    Lower Extremity Assessment Lower Extremity Assessment: Generalized weakness (grossly 3-/5 throughout; L LE mildly edematous)       Communication   Communication: HOH  Cognition Arousal/Alertness: Awake/alert Behavior During Therapy: WFL for tasks assessed/performed Overall Cognitive Status: Within Functional Limits for tasks assessed                                        General Comments      Exercises Other Exercises Other Exercises: Sit/stand x3 with RW, min/mod assist for lift off, standing balance.  Unable to tolerate sustained standing position >20-30 seconds due to fatigue.  Supports body weight without buckling, but generally fatigued and insisting on return to sitting/supine position Other Exercises: Orthostatic vitals assessment-supine and sitting vitals grossly WFL; unable to tolerate standing for duration necessary to achieve full BP measurement.  Does endorse dizziness/lightheadedness with transition from supine to sit, but minimal change in BP noted.   Assessment/Plan    PT Assessment Patient needs continued PT services  PT Problem List Decreased strength;Decreased activity tolerance;Decreased balance;Decreased  mobility;Decreased coordination;Decreased knowledge of use of DME;Decreased safety awareness;Decreased knowledge of precautions;Pain;Obesity       PT Treatment Interventions DME instruction;Therapeutic exercise;Gait training;Stair training;Functional mobility training;Therapeutic activities;Balance training;Patient/family education    PT Goals (Current goals can be found in the Care Plan section)  Acute Rehab PT Goals Patient Stated Goal: to return home, to start treatment Monday PT Goal Formulation: With patient/family Time For Goal Achievement: 09/12/20 Potential to Achieve Goals: Fair    Frequency Min 2X/week   Barriers to discharge        Co-evaluation               AM-PAC PT "6 Clicks" Mobility  Outcome Measure Help needed turning from your back to your side while in a flat bed without using bedrails?: A Lot Help needed moving from lying on your back to sitting on the side of a flat bed without using bedrails?: A Lot Help needed moving to and from a bed to a chair (including a wheelchair)?: A Lot Help needed standing up from a chair using your arms (e.g., wheelchair or bedside chair)?: A Lot Help needed to walk in hospital room?: A Lot Help needed climbing 3-5 steps with a railing? : A Lot 6 Click Score: 12    End of Session Equipment Utilized During Treatment: Gait belt Activity Tolerance: Patient limited by fatigue Patient left: in bed;with call bell/phone within reach;with family/visitor present Nurse Communication: Mobility status PT Visit Diagnosis: Muscle weakness (generalized) (M62.81);Repeated falls (R29.6);Difficulty in walking, not elsewhere classified (R26.2)  Time: 9741-6384 PT Time Calculation (min) (ACUTE ONLY): 27 min   Charges:   PT Evaluation $PT Eval Moderate Complexity: 1 Mod PT Treatments $Therapeutic Activity: 8-22 mins       Marchetta Navratil H. Owens Shark, PT, DPT, NCS 08/29/20, 3:55 PM [phone]

## 2020-08-29 NOTE — ED Notes (Signed)
Patient's family asking if patient should be transferred to [facility] due to patient seeing cancer doc there on Monday for first appointment. Family states they are happy with the care they are receiving here but didn't know if that would be better. Message sent to [place], MD with same.

## 2020-08-30 DIAGNOSIS — L8990 Pressure ulcer of unspecified site, unspecified stage: Secondary | ICD-10-CM

## 2020-08-30 DIAGNOSIS — E440 Moderate protein-calorie malnutrition: Secondary | ICD-10-CM

## 2020-08-30 DIAGNOSIS — E876 Hypokalemia: Secondary | ICD-10-CM | POA: Diagnosis not present

## 2020-08-30 HISTORY — DX: Pressure ulcer of unspecified site, unspecified stage: L89.90

## 2020-08-30 HISTORY — DX: Moderate protein-calorie malnutrition: E44.0

## 2020-08-30 LAB — CBC WITH DIFFERENTIAL/PLATELET
Abs Immature Granulocytes: 0.07 10*3/uL (ref 0.00–0.07)
Basophils Absolute: 0 10*3/uL (ref 0.0–0.1)
Basophils Relative: 0 %
Eosinophils Absolute: 0.1 10*3/uL (ref 0.0–0.5)
Eosinophils Relative: 0 %
HCT: 32.5 % — ABNORMAL LOW (ref 39.0–52.0)
Hemoglobin: 11 g/dL — ABNORMAL LOW (ref 13.0–17.0)
Immature Granulocytes: 1 %
Lymphocytes Relative: 6 %
Lymphs Abs: 0.6 10*3/uL — ABNORMAL LOW (ref 0.7–4.0)
MCH: 26.6 pg (ref 26.0–34.0)
MCHC: 33.8 g/dL (ref 30.0–36.0)
MCV: 78.7 fL — ABNORMAL LOW (ref 80.0–100.0)
Monocytes Absolute: 1.2 10*3/uL — ABNORMAL HIGH (ref 0.1–1.0)
Monocytes Relative: 11 %
Neutro Abs: 9.2 10*3/uL — ABNORMAL HIGH (ref 1.7–7.7)
Neutrophils Relative %: 82 %
Platelets: 229 10*3/uL (ref 150–400)
RBC: 4.13 MIL/uL — ABNORMAL LOW (ref 4.22–5.81)
RDW: 16 % — ABNORMAL HIGH (ref 11.5–15.5)
WBC: 11.2 10*3/uL — ABNORMAL HIGH (ref 4.0–10.5)
nRBC: 0 % (ref 0.0–0.2)

## 2020-08-30 LAB — PHOSPHORUS: Phosphorus: 3.8 mg/dL (ref 2.5–4.6)

## 2020-08-30 LAB — BASIC METABOLIC PANEL
Anion gap: 9 (ref 5–15)
BUN: 21 mg/dL (ref 8–23)
CO2: 31 mmol/L (ref 22–32)
Calcium: 8.1 mg/dL — ABNORMAL LOW (ref 8.9–10.3)
Chloride: 83 mmol/L — ABNORMAL LOW (ref 98–111)
Creatinine, Ser: 0.62 mg/dL (ref 0.61–1.24)
GFR, Estimated: >60 mL/min (ref >60)
Glucose, Bld: 209 mg/dL — ABNORMAL HIGH (ref 70–99)
Potassium: 3.4 mmol/L — ABNORMAL LOW (ref 3.5–5.1)
Sodium: 123 mmol/L — ABNORMAL LOW (ref 135–145)

## 2020-08-30 LAB — ECHOCARDIOGRAM COMPLETE
Area-P 1/2: 2.62 cm2
Height: 67 in
S' Lateral: 3.18 cm
Weight: 2240 oz

## 2020-08-30 LAB — MAGNESIUM: Magnesium: 1.9 mg/dL (ref 1.7–2.4)

## 2020-08-30 MED ORDER — POTASSIUM CHLORIDE CRYS ER 20 MEQ PO TBCR
40.0000 meq | EXTENDED_RELEASE_TABLET | Freq: Once | ORAL | Status: AC
  Administered 2020-08-30: 08:00:00 40 meq via ORAL
  Filled 2020-08-30: qty 2

## 2020-08-30 MED ORDER — ADULT MULTIVITAMIN W/MINERALS CH
1.0000 | ORAL_TABLET | Freq: Every day | ORAL | Status: DC
  Administered 2020-08-31 – 2020-09-04 (×4): 1 via ORAL
  Filled 2020-08-30 (×5): qty 1

## 2020-08-30 MED ORDER — ENSURE ENLIVE PO LIQD
237.0000 mL | Freq: Three times a day (TID) | ORAL | Status: DC
  Administered 2020-08-30 – 2020-09-04 (×14): 237 mL via ORAL

## 2020-08-30 NOTE — NC FL2 (Signed)
[place] LEVEL OF CARE SCREENING TOOL     IDENTIFICATION  Patient Name: Keith Reynolds Birthdate: 08/18/1940 Sex: male Admission Date (Current Location): 08/29/2020  [place] and [place] Number:  Engineering geologist and Address:  [facility], [address], [place], [place] [address]      Provider Number: [id number]  Attending Physician Name and Address:  Terrilee Croak, MD  Relative Name and Phone Number:  Demitri Kucinski (wife)  [phone]    Current Level of Care: Hospital Recommended Level of Care: [place] Prior Approval Number:    Date Approved/Denied:   PASRR Number: [id number]  Discharge Plan: SNF    Current Diagnoses: Patient Active Problem List   Diagnosis Date Noted  . Pressure injury of skin 08/30/2020  . Malnutrition of moderate degree 08/30/2020  . Hypokalemia 08/29/2020  . Prediabetes 06/03/2017  . Hypertension 05/28/2017  . Heart murmur, systolic 51/76/1607  . History of diabetes mellitus 05/28/2017  . Obesity (BMI 30-39.9) 05/28/2017  . Gait instability 05/28/2017  . Chronic venous stasis 05/28/2017    Orientation RESPIRATION BLADDER Height & Weight     Self,Time,Situation,Place  Normal Incontinent,External catheter Weight: 94.8 kg (bed weight) Height:  5\' 7"  (170.2 cm)  BEHAVIORAL SYMPTOMS/MOOD NEUROLOGICAL BOWEL NUTRITION STATUS      Incontinent Diet (see discharge summary)  AMBULATORY STATUS COMMUNICATION OF NEEDS Skin   Extensive Assist Verbally PU Stage and Appropriate Care PU Stage 1 Dressing: Daily (foam dressing)                     Personal Care Assistance Level of Assistance  Bathing,Feeding,Dressing Bathing Assistance: Limited assistance Feeding assistance: Limited assistance Dressing Assistance: Limited assistance     Functional Limitations Info             SPECIAL CARE FACTORS FREQUENCY  PT (By licensed PT),OT (By licensed OT)     PT Frequency: 5 times  per week OT Frequency: 5 times per week            Contractures Contractures Info: Not present    Additional Factors Info  Code Status,Allergies Code Status Info: Full Allergies Info: Antihistamines, Diphenhydramine, bactrim, chamomile, diphenhydramine, gabapentin, levomenol, pseudoephedrine, rosuvastatin, triprolidine           Current Medications (08/30/2020):  This is the current hospital active medication list Current Facility-Administered Medications  Medication Dose Route Frequency Provider Last Rate Last Admin  . 0.9 % NaCl with KCl 20 mEq/ L  infusion   Intravenous Continuous Mansy, Jan A, MD 100 mL/hr at 08/30/20 1348 New Bag at 08/30/20 1348  . acetaminophen (TYLENOL) tablet 650 mg  650 mg Oral Q6H PRN Mansy, Jan A, MD   650 mg at 08/30/20 3710   Or  . acetaminophen (TYLENOL) suppository 650 mg  650 mg Rectal Q6H PRN Mansy, Jan A, MD      . cyclobenzaprine (FLEXERIL) tablet 10 mg  10 mg Oral TID PRN Terrilee Croak, MD   10 mg at 08/30/20 0820  . enoxaparin (LOVENOX) injection 40 mg  40 mg Subcutaneous Q24H Mansy, Jan A, MD   40 mg at 08/30/20 0807  . feeding supplement (ENSURE ENLIVE / ENSURE PLUS) liquid 237 mL  237 mL Oral TID BM Dahal, Binaya, MD      . loperamide (IMODIUM) capsule 4 mg  4 mg Oral Q6H PRN Terrilee Croak, MD   4 mg at 08/29/20 2022  . magnesium hydroxide (MILK OF MAGNESIA) suspension 30  mL  30 mL Oral Daily PRN Mansy, Jan A, MD      . morphine 2 MG/ML injection 2 mg  2 mg Intravenous Q4H PRN Mansy, Jan A, MD      . multivitamin with minerals tablet 1 tablet  1 tablet Oral Daily Dahal, Binaya, MD      . nystatin (MYCOSTATIN/NYSTOP) topical powder   Topical BID Mansy, Arvella Merles, MD   Given at 08/30/20 0805  . promethazine (PHENERGAN) tablet 12.5 mg  12.5 mg Oral Q6H PRN Mansy, Jan A, MD   12.5 mg at 08/30/20 0905  . traZODone (DESYREL) tablet 25 mg  25 mg Oral QHS PRN Mansy, Jan A, MD   25 mg at 08/30/20 0003     Discharge Medications: Please see discharge  summary for a list of discharge medications.  Relevant Imaging Results:  Relevant Lab Results:   Additional Information SS# [id number]  Shelbie Hutching, RN

## 2020-08-30 NOTE — Progress Notes (Signed)
Initial Nutrition Assessment  DOCUMENTATION CODES:  Non-severe (moderate) malnutrition in context of chronic illness  INTERVENTION:   Advance to soft diet as pt tolerates  MVI with minerals daily  Ensure Enlive po TID, each supplement provides 350 kcal and 20 grams of protein  NUTRITION DIAGNOSIS:  Moderate Malnutrition (in the context of chronic illness) related to cancer and cancer related treatments as evidenced by moderate fat depletion,mild muscle depletion,moderate muscle depletion.  GOAL:  Patient will meet greater than or equal to 90% of their needs  MONITOR:  PO intake,Supplement acceptance,Diet advancement,Weight trends  REASON FOR ASSESSMENT:  Malnutrition Screening Tool    ASSESSMENT:  Pt presented to the ER 5/19 with worsening bilateral lower extremity pain over the last 3 weeks. Reports cramps in both legs and a fall earlier in the day due to weakness and pain. Also reports diarrhea x3 weeks. In ED, found to have altered electrolytes, anemia, and elevated blood pressure. PMH significant for HTN and recently diagnosed colon cancer (has not started treatment).  Pt resting in bed at the time of visit, daughter at bedside assists with interview. Daughter states that pt has been losing weight for about 6 months despite good intake of food. However, intake and functional status have declined significantly over the last ~4 weeks since pt was dx with cancer. Previously pt was eating ~5x/d but daughter states he now only consumes a few bites at each meal. Has been drinking two premier protein shakes per day at home, agreeable to ensure while admitted. Prefers vanilla. Discussed tips for increasing kcal and protein in meals and snacks at home.   New bed weight obtained while in room and entered, 5/19 weight reported at 140lb - which is inaccurate.   Pt is scheduled to have first oncology appointment on Monday, 5/23 to discuss treatment options.   Relevant  Medications: Continous Infusions: . 0.9 % NaCl with KCl 20 mEq / L 100 mL/hr at 08/30/20 0326  PRN Meds: loperamide, magnesium hydroxide, promethazine  Labs reviewed:  Na 123 / Chloride 83  K 3.4  SBG ranges from 209-217 mg/dL over the last 24 hours  HgbA1c 7% (03/14/20)  NUTRITION - FOCUSED PHYSICAL EXAM: Flowsheet Row Most Recent Value  Orbital Region Moderate depletion  Upper Arm Region Moderate depletion  Thoracic and Lumbar Region Moderate depletion  Buccal Region Moderate depletion  [place] Region Moderate depletion  Clavicle Bone Region Mild depletion  Clavicle and Acromion Bone Region Moderate depletion  Scapular Bone Region Moderate depletion  Dorsal Hand Mild depletion  Patellar Region Moderate depletion  Anterior Thigh Region Moderate depletion  Posterior Calf Region Moderate depletion  Edema (RD Assessment) Mild  Hair Reviewed  Eyes Reviewed  Mouth Reviewed  Skin Reviewed  Nails Reviewed     Diet Order:   Diet Order            Diet full liquid Room service appropriate? Yes; Fluid consistency: Thin  Diet effective now                EDUCATION NEEDS:  Education needs have been addressed  Skin:  Skin Assessment: Reviewed RN Assessment  Last BM:  5/19 per RN documentation, type 7  Height:  Ht Readings from Last 1 Encounters:  08/29/20 5\' 7"  (1.702 m)   Weight:  Wt Readings   08/30/20 94.8 kg  07/15/20 93.6 kg (206 lb 6.4 oz) [facility]  06/26/20 92.9 kg [facility]  03/20/20 101.2 kg (223 lb) [employer]  [facility]  08/23/19 107 kg (236 lb) [facility]   Ideal Body Weight:  67.3 kg  BMI:  Body mass index is 32.73 kg/m.  Estimated Nutritional Needs:   Kcal:  2000-2200 kcal/d  Protein:  100-120 g/d  Fluid:  >2L/d   Ranell Patrick, RD, LDN Clinical Dietitian Pager on [place]

## 2020-08-30 NOTE — Progress Notes (Signed)
PROGRESS NOTE  Keith Reynolds  DOB: [birth year]  PCP: Sofie Hartigan, MD IOE:[id number]  DOA: 08/29/2020  LOS: 1 day  Hospital Day: 2   Chief Complaint  Patient presents with  . Weakness  . Leg Pain    Brief narrative: Keith Reynolds is a 80 y.o. male with PMH significant for essential hypertension and recently diagnosed colon cancer, for which he has a pending follow up at [facility]. Patient presented to the ED late last night with complaint of acute onset bilateral lower extremity pain, progressively worsening for last 3 weeks associated with cramps in hands impaired mobility.  He has been having diarrhea for last 3 weeks with generalized weakness.  In the ED, blood pressure was elevated to 186/93 Labs showed sodium level low at 122, potassium low at 2.4, blood glucose level at 200, hemoglobin at 10.9, low compared to 12.1 in March.  WBC 10.5. Ultrasound duplex of both lower extremities were normal.   Patient was given potassium replacement, magnesium replacement Admitted to hospitalist service. See below for details  Subjective: Patient was seen and examined this morning.   Diarrhea improving.  Muscle cramps improving.  Still feels weak though.  Assessment/Plan: Persistent diarrhea -GI pathogen panel negative.  Diarrhea probably related to colon cancer.   -Currently on IV hydration.  Severe hypokalemia Hypomagnesemia Hypophosphatemia -Secondary to persistent diarrhea.  IV and oral replacement given.  Level improving.  Potassium level low at 2.4 this morning.  Continue replacement. -Repeat labs tomorrow. Recent Labs  Lab 08/29/20 0143 08/29/20 0914 08/29/20 1042 08/30/20 0104  K 2.4* 3.2* 3.3* 3.4*  MG 1.7  --   --  1.9  PHOS  --  2.4*  --  3.8   Severe hyponatremia -Sodium level low at 122 at presentation.  Likely hypovolemic in the setting of diarrhea.  Expect improvement with hydration.  Only minimal improvement in last 24 hours.  Obtain  urine osmolality to rule out SIADH. Recent Labs  Lab 08/29/20 0143 08/29/20 0914 08/29/20 1042 08/30/20 0104  NA 122* 124* 123* 123*   Generalized weakness Muscle cramps -Probably due to electrolyte abnormalities and dehydration. Flexeril as needed for muscle spasm. -Encourage ambulation. PT eval appreciated.  SNF recommended  Recent diagnosis of colon cancer -Noted a plan to follow-up at [facility].  Dropping hemoglobin -Hemoglobin was 12.1 on March, presented with slight drop of 10.9.  MCV low at 78.  Adequate ferritin. Recent Labs    08/29/20 0143 08/29/20 0914 08/29/20 1042 08/29/20 1843 08/30/20 0104  HGB 10.9* 11.0*  --  12.2* 11.0*  MCV 77.6* 77.6*  --   --  78.7*  VITAMINB12  --   --  303  --   --   FOLATE 6.6  --   --   --   --   FERRITIN 441*  --   --   --   --   TIBC 193*  --   --   --   --   IRON 64  --   --   --   --   RETICCTPCT 4.5*  --   --   --   --    Dyslipidemia -Statin  Bilateral pedal edema -Intermittent for last several months.  More prominent now.  Albumin level at 3. -Ultrasound duplex lower extremities negative.  Echocardiogram with EF normal at 60 to 18%, grade 1 diastolic dysfunction.  Mobility: Encourage ambulation Code Status:   Code Status: Full Code  Nutritional status: Body mass  index is 32.73 kg/m. Nutrition Problem: Moderate Malnutrition (in the context of chronic illness) Etiology: cancer and cancer related treatments Signs/Symptoms: moderate fat depletion,mild muscle depletion,moderate muscle depletion Diet Order            Diet full liquid Room service appropriate? Yes; Fluid consistency: Thin  Diet effective now                 DVT prophylaxis: enoxaparin (LOVENOX) injection 40 mg Start: 08/29/20 0800   Antimicrobials:  None Fluid: Normal saline at a reduced rate of 50 mill per hour Consultants: None Family Communication:  Daughter at bedside  Status is: Inpatient  Remains inpatient appropriate because: Continue  monitor for diarrhea, continue to monitor electrolytes, continue IV hydration  Dispo: The patient is from: Home              Anticipated d/c is to: SNF in 2 to 3 days              Patient currently is not medically stable to d/c.   Difficult to place patient No     Infusions:  . 0.9 % NaCl with KCl 20 mEq / L 100 mL/hr at 08/30/20 1348    Scheduled Meds: . enoxaparin (LOVENOX) injection  40 mg Subcutaneous Q24H  . feeding supplement  237 mL Oral TID BM  . multivitamin with minerals  1 tablet Oral Daily  . nystatin   Topical BID    Antimicrobials: Anti-infectives (From admission, onward)   None      PRN meds: acetaminophen **OR** acetaminophen, cyclobenzaprine, loperamide, magnesium hydroxide, morphine injection, promethazine, traZODone   Objective: Vitals:   08/30/20 0809 08/30/20 1156  BP: (!) 186/93 (!) 144/68  Pulse: 81 76  Resp: 18 16  Temp: 97.8 F (36.6 C) 98.3 F (36.8 C)  SpO2: 97% 97%    Intake/Output Summary (Last 24 hours) at 08/30/2020 1412 Last data filed at 08/30/2020 0558 Gross per 24 hour  Intake 1197.47 ml  Output 100 ml  Net 1097.47 ml   Filed Weights   08/29/20 0125 08/30/20 1041  Weight: 63.5 kg 94.8 kg   Weight change:  Body mass index is 32.73 kg/m.   Physical Exam: General exam: Pleasant, elderly Caucasian male. Skin: No rashes, lesions or ulcers. HEENT: Atraumatic, normocephalic, no obvious bleeding Lungs: Clear to auscultation bilaterally CVS: Regular rate and rhythm, no murmur GI/Abd abdomen mass and tenderness related to cancer CNS: Seems tired and sleepy Psychiatry: Mood appropriate Extremities: 1+ bilateral pedal edema, no calf tenderness  Data Review: I have personally reviewed the laboratory data and studies available.  Recent Labs  Lab 08/29/20 0143 08/29/20 0914 08/29/20 1843 08/30/20 0104  WBC 10.5 10.5  --  11.2*  NEUTROABS 8.0*  --   --  9.2*  HGB 10.9* 11.0* 12.2* 11.0*  HCT 31.9* 31.6* 35.5* 32.5*  MCV  77.6* 77.6*  --  78.7*  PLT 252 254  --  229   Recent Labs  Lab 08/29/20 0143 08/29/20 0914 08/29/20 1042 08/30/20 0104  NA 122* 124* 123* 123*  K 2.4* 3.2* 3.3* 3.4*  CL 78* 81* 80* 83*  CO2 31 31 30 31   GLUCOSE 200* 202* 217* 209*  BUN 16 15 15 21   CREATININE 0.41* 0.49* 0.48* 0.62  CALCIUM 8.5* 8.3* 8.3* 8.1*  MG 1.7  --   --  1.9  PHOS  --  2.4*  --  3.8    F/u labs ordered Unresulted Labs (From admission, onward)  Start     Ordered   08/30/20 1411  Osmolality, urine  Once,   R        08/30/20 1410   08/30/20 0500  CBC with Differential/Platelet  Daily,   STAT      08/29/20 1410   08/30/20 9509  Basic metabolic panel  Daily,   STAT      08/29/20 1410   Unscheduled  Occult blood card to lab, stool  As needed,   STAT      08/29/20 0543          Signed, Terrilee Croak, MD [facility] 08/30/2020

## 2020-08-30 NOTE — Progress Notes (Signed)
   08/30/20 0945  Clinical Encounter Type  Visited With Patient and family together  Visit Type Initial  Referral From Nurse  Consult/Referral To [place] responded to an OR for room 108, Pt Keith Reynolds. I did the education part for an AD and Pt's daughter stated, "I will call you once we complete the document.

## 2020-08-30 NOTE — TOC Initial Note (Signed)
Transition of Care [facility]) - Initial/Assessment Note    Patient Details  Name: Keith Reynolds MRN: [id number] Date of Birth: 04-Nov-1940  Transition of Care [facility] CM/SW Contact:    Shelbie Hutching, RN Phone Number: 08/30/2020, 1:07 PM  Clinical Narrative:                 Patient admitted to the hospital with hypokalemia, he came into the ED for leg weakness.  Patient recently diagnosed with colon cancer.  RNCM met with patient and patient's daughter, [name] at the bedside this morning.  Patient appears to not be feeling well and closes his eyes while daughter and RNCM talk at the bedside.   Patient is from home where he lives with his wife and daughter.  Daughter reports that over the past 3-4 weeks he has been getting progressively weaker and even had to start using a walker.  Patient became so weak he fell at home and family brought him in.   Daughter agrees with SNF for rehab and chooses [facility] if they offer a bed.  RNCM will start SNF workup.    Expected Discharge Plan: Skilled Nursing Facility Barriers to Discharge: Continued Medical Work up   Patient Goals and CMS Choice Patient states their goals for this hospitalization and ongoing recovery are:: Patient's family is in agreement with patient getting short term rehab at [facility] CMS Medicare.gov Compare Post Acute Care list provided to:: Patient Represenative (must comment) Choice offered to / list presented to : [place]  Expected Discharge Plan and Services Expected Discharge Plan: [place]   Discharge Planning Services: CM Consult Post Acute Care Choice: [place] Living arrangements for the past 2 months: Single Family Home                 DME Arranged: N/A DME Agency: NA       HH Arranged: NA          Prior Living Arrangements/Services Living arrangements for the past 2 months: Single Family Home Lives with:: [place] Patient language and need for  interpreter reviewed:: Yes Do you feel safe going back to the place where you live?: Yes      Need for Family Participation in Patient Care: Yes (Comment) (colon cancer) Care giver support system in place?: Yes (comment) (daughter and wife) Current home services: DME (walker) Criminal Activity/Legal Involvement Pertinent to Current Situation/Hospitalization: No - Comment as needed  Activities of Daily Living Home Assistive Devices/Equipment: Engineer, drilling (specify type) ADL Screening (condition at time of admission) Patient's cognitive ability adequate to safely complete daily activities?: Yes Is the patient deaf or have difficulty hearing?: No Does the patient have difficulty seeing, even when wearing glasses/contacts?: No Does the patient have difficulty concentrating, remembering, or making decisions?: No Patient able to express need for assistance with ADLs?: Yes Does the patient have difficulty dressing or bathing?: No Independently performs ADLs?: Yes (appropriate for developmental age) Does the patient have difficulty walking or climbing stairs?: Yes Weakness of Legs: Both Weakness of Arms/Hands: None  Permission Sought/Granted Permission sought to share information with : Case Manager,Family Chief Financial Officer Permission granted to share information with : Yes, Verbal Permission Granted  Share Information with NAME: [name] and [place] granted to share info w AGENCY: SNF's  Permission granted to share info w Relationship: daughter and wife     Emotional Assessment Appearance:: Appears stated age Attitude/Demeanor/Rapport: Lethargic   Orientation: : Oriented to Self,Oriented to Place Alcohol /  Substance Use: Not Applicable Psych Involvement: No (comment)  Admission diagnosis:  Hypokalemia [E87.6] Hyponatremia [E87.1] Chronic diarrhea [K52.9] Bilateral leg pain [M79.604, M79.605] Generalized weakness [R53.1] Patient Active Problem  List   Diagnosis Date Noted  . Pressure injury of skin 08/30/2020  . Hypokalemia 08/29/2020  . Prediabetes 06/03/2017  . Hypertension 05/28/2017  . Heart murmur, systolic 05/28/2017  . History of diabetes mellitus 05/28/2017  . Obesity (BMI 30-39.9) 05/28/2017  . Gait instability 05/28/2017  . Chronic venous stasis 05/28/2017   PCP:  Feldpausch, Dale E, MD Pharmacy:   [id number] - [place], [place] - [address] [place] [address] [place] [address] [place] [address] Phone: [phone] Fax: [phone]     Social Determinants of Health (SDOH) Interventions    Readmission Risk Interventions No flowsheet data found.  

## 2020-08-31 DIAGNOSIS — E876 Hypokalemia: Secondary | ICD-10-CM | POA: Diagnosis not present

## 2020-08-31 LAB — BASIC METABOLIC PANEL
Anion gap: 8 (ref 5–15)
BUN: 34 mg/dL — ABNORMAL HIGH (ref 8–23)
CO2: 29 mmol/L (ref 22–32)
Calcium: 8 mg/dL — ABNORMAL LOW (ref 8.9–10.3)
Chloride: 86 mmol/L — ABNORMAL LOW (ref 98–111)
Creatinine, Ser: 1.12 mg/dL (ref 0.61–1.24)
GFR, Estimated: >60 mL/min (ref >60)
Glucose, Bld: 184 mg/dL — ABNORMAL HIGH (ref 70–99)
Potassium: 4.2 mmol/L (ref 3.5–5.1)
Sodium: 123 mmol/L — ABNORMAL LOW (ref 135–145)

## 2020-08-31 LAB — CBC WITH DIFFERENTIAL/PLATELET
Abs Immature Granulocytes: 0.14 10*3/uL — ABNORMAL HIGH (ref 0.00–0.07)
Basophils Absolute: 0 10*3/uL (ref 0.0–0.1)
Basophils Relative: 0 %
Eosinophils Absolute: 0.1 10*3/uL (ref 0.0–0.5)
Eosinophils Relative: 1 %
HCT: 32.6 % — ABNORMAL LOW (ref 39.0–52.0)
Hemoglobin: 11 g/dL — ABNORMAL LOW (ref 13.0–17.0)
Immature Granulocytes: 1 %
Lymphocytes Relative: 6 %
Lymphs Abs: 0.8 10*3/uL (ref 0.7–4.0)
MCH: 26.8 pg (ref 26.0–34.0)
MCHC: 33.7 g/dL (ref 30.0–36.0)
MCV: 79.5 fL — ABNORMAL LOW (ref 80.0–100.0)
Monocytes Absolute: 1.3 10*3/uL — ABNORMAL HIGH (ref 0.1–1.0)
Monocytes Relative: 9 %
Neutro Abs: 12 10*3/uL — ABNORMAL HIGH (ref 1.7–7.7)
Neutrophils Relative %: 83 %
Platelets: 253 10*3/uL (ref 150–400)
RBC: 4.1 MIL/uL — ABNORMAL LOW (ref 4.22–5.81)
RDW: 16.7 % — ABNORMAL HIGH (ref 11.5–15.5)
WBC: 14.4 10*3/uL — ABNORMAL HIGH (ref 4.0–10.5)
nRBC: 0 % (ref 0.0–0.2)

## 2020-08-31 LAB — OSMOLALITY, URINE: Osmolality, Ur: 330 mOsm/kg (ref 300–900)

## 2020-08-31 NOTE — Progress Notes (Signed)
PROGRESS NOTE  MEGAN HAYDUK  DOB: Feb 08, 1941  PCP: Sofie Hartigan, MD YQI:[id number]  DOA: 08/29/2020  LOS: 2 days  Hospital Day: 3   Chief Complaint  Patient presents with  . Weakness  . Leg Pain    Brief narrative: Keith Reynolds is a 80 y.o. male with PMH significant for essential hypertension and recently diagnosed colon cancer, for which he has a pending follow up at [facility]. Patient presented to the ED late last night with complaint of acute onset bilateral lower extremity pain, progressively worsening for last 3 weeks associated with cramps in hands impaired mobility.  He has been having diarrhea for last 3 weeks with generalized weakness.  In the ED, blood pressure was elevated to 186/93 Labs showed sodium level low at 122, potassium low at 2.4, blood glucose level at 200, hemoglobin at 10.9, low compared to 12.1 in March.  WBC 10.5. Ultrasound duplex of both lower extremities were normal.   Patient was given potassium replacement, magnesium replacement Admitted to hospitalist service. See below for details.  Subjective: Patient was seen and examined this morning.   Propped up in bed.  Not in distress.  Daughter at bedside. No diarrhea in last 24 hours.  Muscle cramps improved.  Weakness persists  Assessment/Plan: Acute persistent diarrhea -Patient presented with diarrhea for 3 weeks.  Probably related to cancer.  Improved with Imodium as needed.  GI pathogen panel normal.  Adequately hydrated.  Encourage oral hydration  Severe hypokalemia Hypomagnesemia Hypophosphatemia -Secondary to persistent diarrhea.  IV and oral replacement given.  Levels normalized with replacement -Repeat labs intermittently. Recent Labs  Lab 08/29/20 0143 08/29/20 0914 08/29/20 1042 08/30/20 0104 08/31/20 0516  K 2.4* 3.2* 3.3* 3.4* 4.2  MG 1.7  --   --  1.9  --   PHOS  --  2.4*  --  3.8  --    Severe hyponatremia -Sodium level low at 122 at presentation.   My initial suspicion was hypovolemic hyponatremia due to poor oral intake and hydration.  However despite adequate hydration and clinical improvement, sodium level has not improved and is static at 123.  Need to obtain urine osmolality to rule out SIADH related to cancer.  Urine osmolality sent.  Avoid IV hydration at this time. Recent Labs  Lab 08/29/20 0143 08/29/20 0914 08/29/20 1042 08/30/20 0104 08/31/20 0516  NA 122* 124* 123* 123* 123*   Generalized weakness Muscle cramps -Probably due to electrolyte abnormalities and dehydration. Flexeril as needed for muscle spasm. -Encourage ambulation. PT eval appreciated.  SNF recommended  Recent diagnosis of colon cancer -Noted a plan to follow-up at [facility].  Mild chronic microcytic anemia -Hemoglobin level stable.    Dyslipidemia -Statin  Bilateral pedal edema -Intermittent for last several months.  More prominent now.  Albumin level at 3. -Ultrasound duplex lower extremities negative.  Echocardiogram with EF normal at 60 to 38%, grade 1 diastolic dysfunction. -Apply Ace wrap from both legs.  Recommend leg elevation.  Impaired mobility/generalized weakness -PT eval obtained.  SNF recommended.  Mobility: Encourage ambulation Code Status:   Code Status: Full Code  Nutritional status: Body mass index is 32.73 kg/m. Nutrition Problem: Moderate Malnutrition (in the context of chronic illness) Etiology: cancer and cancer related treatments Signs/Symptoms: moderate fat depletion,mild muscle depletion,moderate muscle depletion Diet Order            Diet full liquid Room service appropriate? Yes; Fluid consistency: Thin  Diet effective now  DVT prophylaxis: enoxaparin (LOVENOX) injection 40 mg Start: 08/29/20 0800   Antimicrobials:  None Fluid: Stop IV fluid today Consultants: None Family Communication:  Daughter at bedside  Status is: Inpatient  Remains inpatient appropriate because: Continues to have low  sodium level.  Work-up continued Dispo: The patient is from: Home              Anticipated d/c is to: SNF in 2 to 3 days              Patient currently is not medically stable to d/c.   Difficult to place patient No     Infusions:    Scheduled Meds: . enoxaparin (LOVENOX) injection  40 mg Subcutaneous Q24H  . feeding supplement  237 mL Oral TID BM  . multivitamin with minerals  1 tablet Oral Daily  . nystatin   Topical BID    Antimicrobials: Anti-infectives (From admission, onward)   None      PRN meds: acetaminophen **OR** acetaminophen, cyclobenzaprine, loperamide, magnesium hydroxide, morphine injection, promethazine, traZODone   Objective: Vitals:   08/31/20 0508 08/31/20 0831  BP: 133/73 135/67  Pulse: 86 92  Resp: 18 16  Temp: 98.1 F (36.7 C) 98.5 F (36.9 C)  SpO2: 96% 98%    Intake/Output Summary (Last 24 hours) at 08/31/2020 1003 Last data filed at 08/30/2020 2215 Gross per 24 hour  Intake 240 ml  Output --  Net 240 ml   Filed Weights   08/29/20 0125 08/30/20 1041  Weight: 63.5 kg 94.8 kg   Weight change:  Body mass index is 32.73 kg/m.   Physical Exam: General exam: Pleasant, elderly Caucasian male.  Not in physical distress.  No muscle cramp this morning Skin: No rashes, lesions or ulcers. HEENT: Atraumatic, normocephalic, no obvious bleeding Lungs: Clear to auscultation bilaterally CVS: Regular rate and rhythm, no murmur GI/Abd abdomen mass and tenderness related to cancer CNS: More awake today, oriented to place and person.  Slow to respond.  Hard of hearing at baseline. Psychiatry: Mood appropriate Extremities: 1+ bilateral pedal edema, no calf tenderness  Data Review: I have personally reviewed the laboratory data and studies available.  Recent Labs  Lab 08/29/20 0143 08/29/20 0914 08/29/20 1843 08/30/20 0104 08/31/20 0516  WBC 10.5 10.5  --  11.2* 14.4*  NEUTROABS 8.0*  --   --  9.2* 12.0*  HGB 10.9* 11.0* 12.2* 11.0* 11.0*   HCT 31.9* 31.6* 35.5* 32.5* 32.6*  MCV 77.6* 77.6*  --  78.7* 79.5*  PLT 252 254  --  229 253   Recent Labs  Lab 08/29/20 0143 08/29/20 0914 08/29/20 1042 08/30/20 0104 08/31/20 0516  NA 122* 124* 123* 123* 123*  K 2.4* 3.2* 3.3* 3.4* 4.2  CL 78* 81* 80* 83* 86*  CO2 31 31 30 31 29   GLUCOSE 200* 202* 217* 209* 184*  BUN 16 15 15 21  34*  CREATININE 0.41* 0.49* 0.48* 0.62 1.12  CALCIUM 8.5* 8.3* 8.3* 8.1* 8.0*  MG 1.7  --   --  1.9  --   PHOS  --  2.4*  --  3.8  --     F/u labs ordered Unresulted Labs (From admission, onward)          Start     Ordered   08/30/20 1411  Osmolality, urine  Once,   R        08/30/20 1410   08/30/20 0500  CBC with Differential/Platelet  Daily,   STAT  08/29/20 1410   08/30/20 2956  Basic metabolic panel  Daily,   STAT      08/29/20 1410   Unscheduled  Occult blood card to lab, stool  As needed,   STAT      08/29/20 0543          Signed, Terrilee Croak, MD [facility] 08/31/2020

## 2020-08-31 NOTE — Progress Notes (Signed)
  Chaplain On-Call responded to Order Requisition requesting "need to finalize Living Will".  Chaplain met patient and his daughter [name] at bedside. Informed them that completion of Living Will cannot happen until Monday due to unavailability of hospital Volunteers to serve as witnesses, and absence of Notaries on weekends. Patient and daughter stated their understanding.  Chaplain spoke with RN Rodman Key who sent the Order Requisition to let him know as well.  Chaplain will notify the Chaplain who will be on-duty on Monday, May 23, to follow-up with the patient and daughter.  Chaplain Pollyann Samples M.Div., [facility]

## 2020-09-01 ENCOUNTER — Inpatient Hospital Stay: Payer: PPO

## 2020-09-01 DIAGNOSIS — E876 Hypokalemia: Secondary | ICD-10-CM | POA: Diagnosis not present

## 2020-09-01 LAB — URINALYSIS, ROUTINE W REFLEX MICROSCOPIC
Bilirubin Urine: NEGATIVE
Glucose, UA: NEGATIVE mg/dL
Ketones, ur: NEGATIVE mg/dL
Nitrite: NEGATIVE
Protein, ur: NEGATIVE mg/dL
Specific Gravity, Urine: 1.006 (ref 1.005–1.030)
WBC, UA: >50 WBC/hpf — ABNORMAL HIGH (ref 0–5)
pH: 6 (ref 5.0–8.0)

## 2020-09-01 LAB — CREATININE, URINE, RANDOM: Creatinine, Urine: 70 mg/dL

## 2020-09-01 LAB — PROTEIN / CREATININE RATIO, URINE
Creatinine, Urine: 69 mg/dL
Protein Creatinine Ratio: 0.32 mg/mg{Cre} — ABNORMAL HIGH (ref 0.00–0.15)
Total Protein, Urine: 22 mg/dL

## 2020-09-01 LAB — CBC WITH DIFFERENTIAL/PLATELET
Abs Immature Granulocytes: 0.11 10*3/uL — ABNORMAL HIGH (ref 0.00–0.07)
Basophils Absolute: 0 10*3/uL (ref 0.0–0.1)
Basophils Relative: 0 %
Eosinophils Absolute: 0.1 10*3/uL (ref 0.0–0.5)
Eosinophils Relative: 1 %
HCT: 31.6 % — ABNORMAL LOW (ref 39.0–52.0)
Hemoglobin: 10.8 g/dL — ABNORMAL LOW (ref 13.0–17.0)
Immature Granulocytes: 1 %
Lymphocytes Relative: 8 %
Lymphs Abs: 0.9 10*3/uL (ref 0.7–4.0)
MCH: 27.1 pg (ref 26.0–34.0)
MCHC: 34.2 g/dL (ref 30.0–36.0)
MCV: 79.2 fL — ABNORMAL LOW (ref 80.0–100.0)
Monocytes Absolute: 1.1 10*3/uL — ABNORMAL HIGH (ref 0.1–1.0)
Monocytes Relative: 10 %
Neutro Abs: 9.7 10*3/uL — ABNORMAL HIGH (ref 1.7–7.7)
Neutrophils Relative %: 80 %
Platelets: 270 10*3/uL (ref 150–400)
RBC: 3.99 MIL/uL — ABNORMAL LOW (ref 4.22–5.81)
RDW: 16.9 % — ABNORMAL HIGH (ref 11.5–15.5)
WBC: 12 10*3/uL — ABNORMAL HIGH (ref 4.0–10.5)
nRBC: 0 % (ref 0.0–0.2)

## 2020-09-01 LAB — BASIC METABOLIC PANEL
Anion gap: 10 (ref 5–15)
BUN: 45 mg/dL — ABNORMAL HIGH (ref 8–23)
CO2: 27 mmol/L (ref 22–32)
Calcium: 8 mg/dL — ABNORMAL LOW (ref 8.9–10.3)
Chloride: 85 mmol/L — ABNORMAL LOW (ref 98–111)
Creatinine, Ser: 1.4 mg/dL — ABNORMAL HIGH (ref 0.61–1.24)
GFR, Estimated: 51 mL/min — ABNORMAL LOW (ref >60)
Glucose, Bld: 204 mg/dL — ABNORMAL HIGH (ref 70–99)
Potassium: 4.2 mmol/L (ref 3.5–5.1)
Sodium: 122 mmol/L — ABNORMAL LOW (ref 135–145)

## 2020-09-01 LAB — SODIUM
Sodium: 122 mmol/L — ABNORMAL LOW (ref 135–145)
Sodium: 125 mmol/L — ABNORMAL LOW (ref 135–145)

## 2020-09-01 LAB — GLUCOSE, CAPILLARY
Glucose-Capillary: 167 mg/dL — ABNORMAL HIGH (ref 70–99)
Glucose-Capillary: 190 mg/dL — ABNORMAL HIGH (ref 70–99)
Glucose-Capillary: 193 mg/dL — ABNORMAL HIGH (ref 70–99)

## 2020-09-01 LAB — SODIUM, URINE, RANDOM: Sodium, Ur: <10 mmol/L

## 2020-09-01 LAB — HEMOGLOBIN A1C
Hgb A1c MFr Bld: 7.1 % — ABNORMAL HIGH (ref 4.8–5.6)
Mean Plasma Glucose: 157.07 mg/dL

## 2020-09-01 IMAGING — CR DG ABD PORTABLE 1V
1 series · 2 of 2 positions shown · non-contrast
Comparison: CT abdomen and pelvis from [DATE].

CLINICAL DATA: Rigidity of the abdomen, history of colon cancer
with hernia and hypertension, former smoker.

EXAM:
PORTABLE ABDOMEN - 1 VIEW

[Series 1: view not recorded · 0.14mm/px · 2 of 2 slices shown]
[im 1/2]
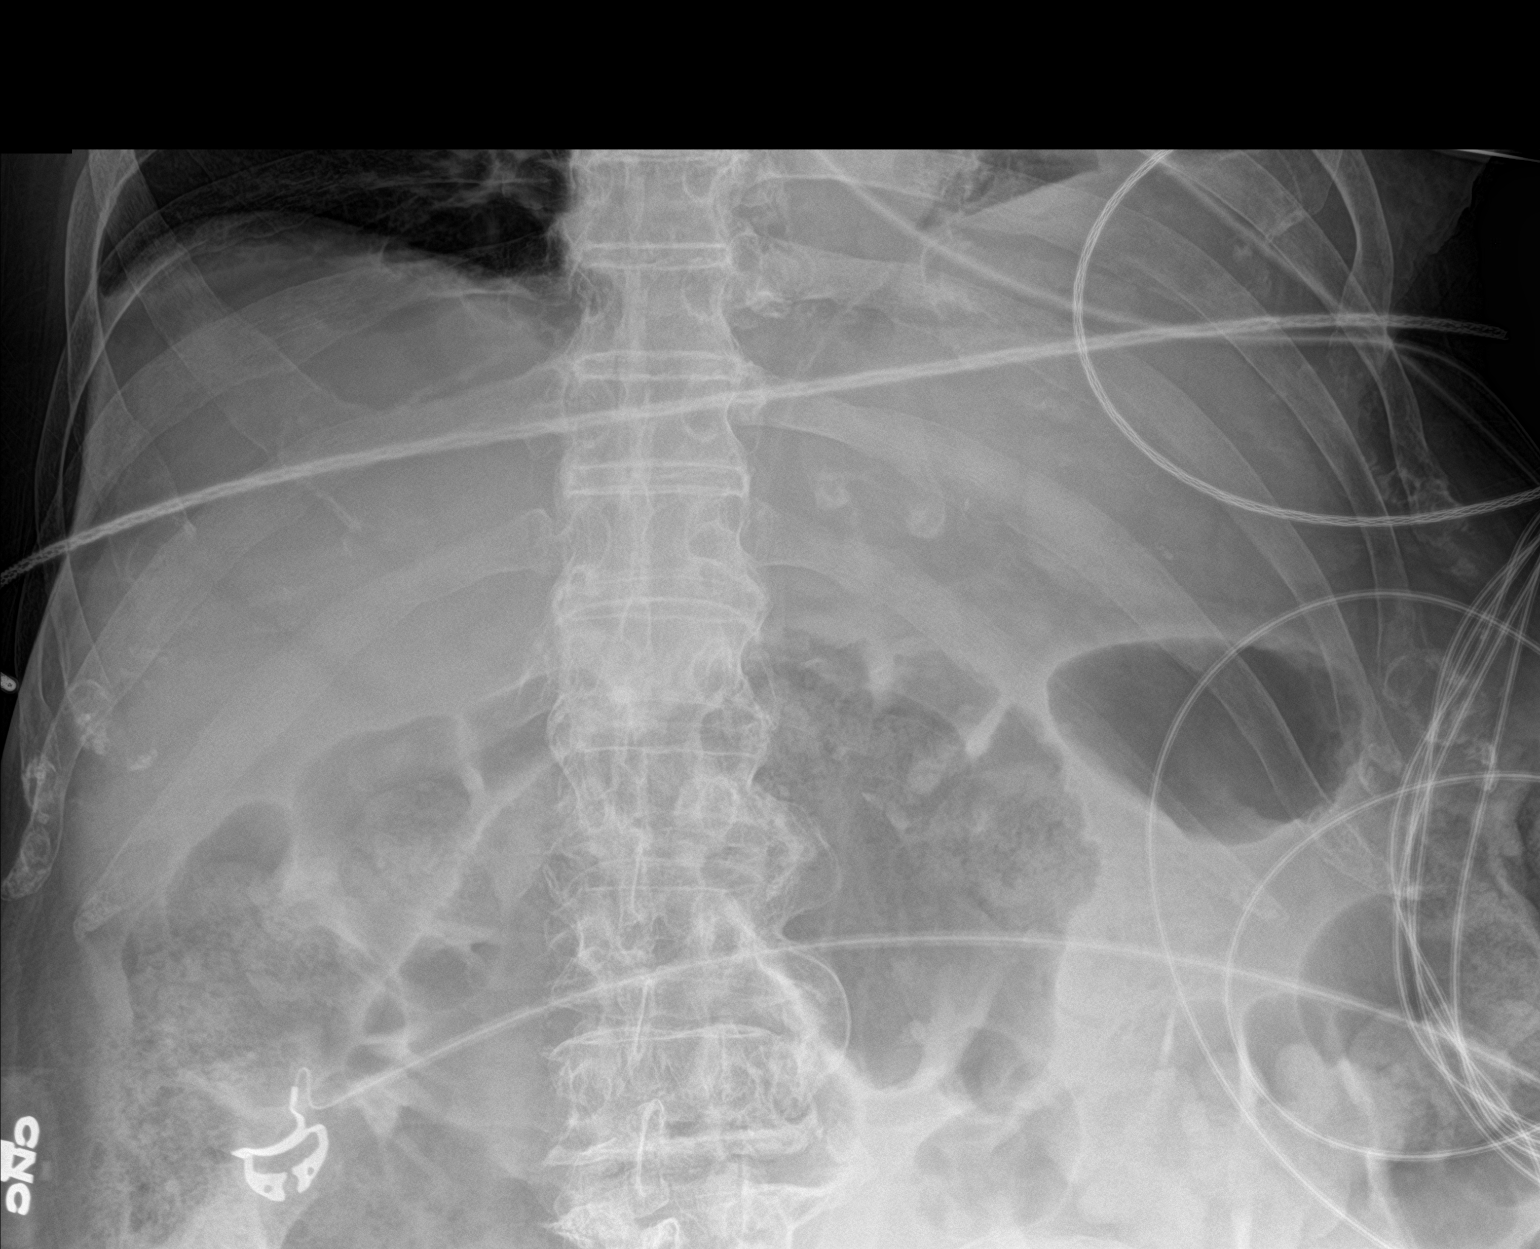
[im 2/2]
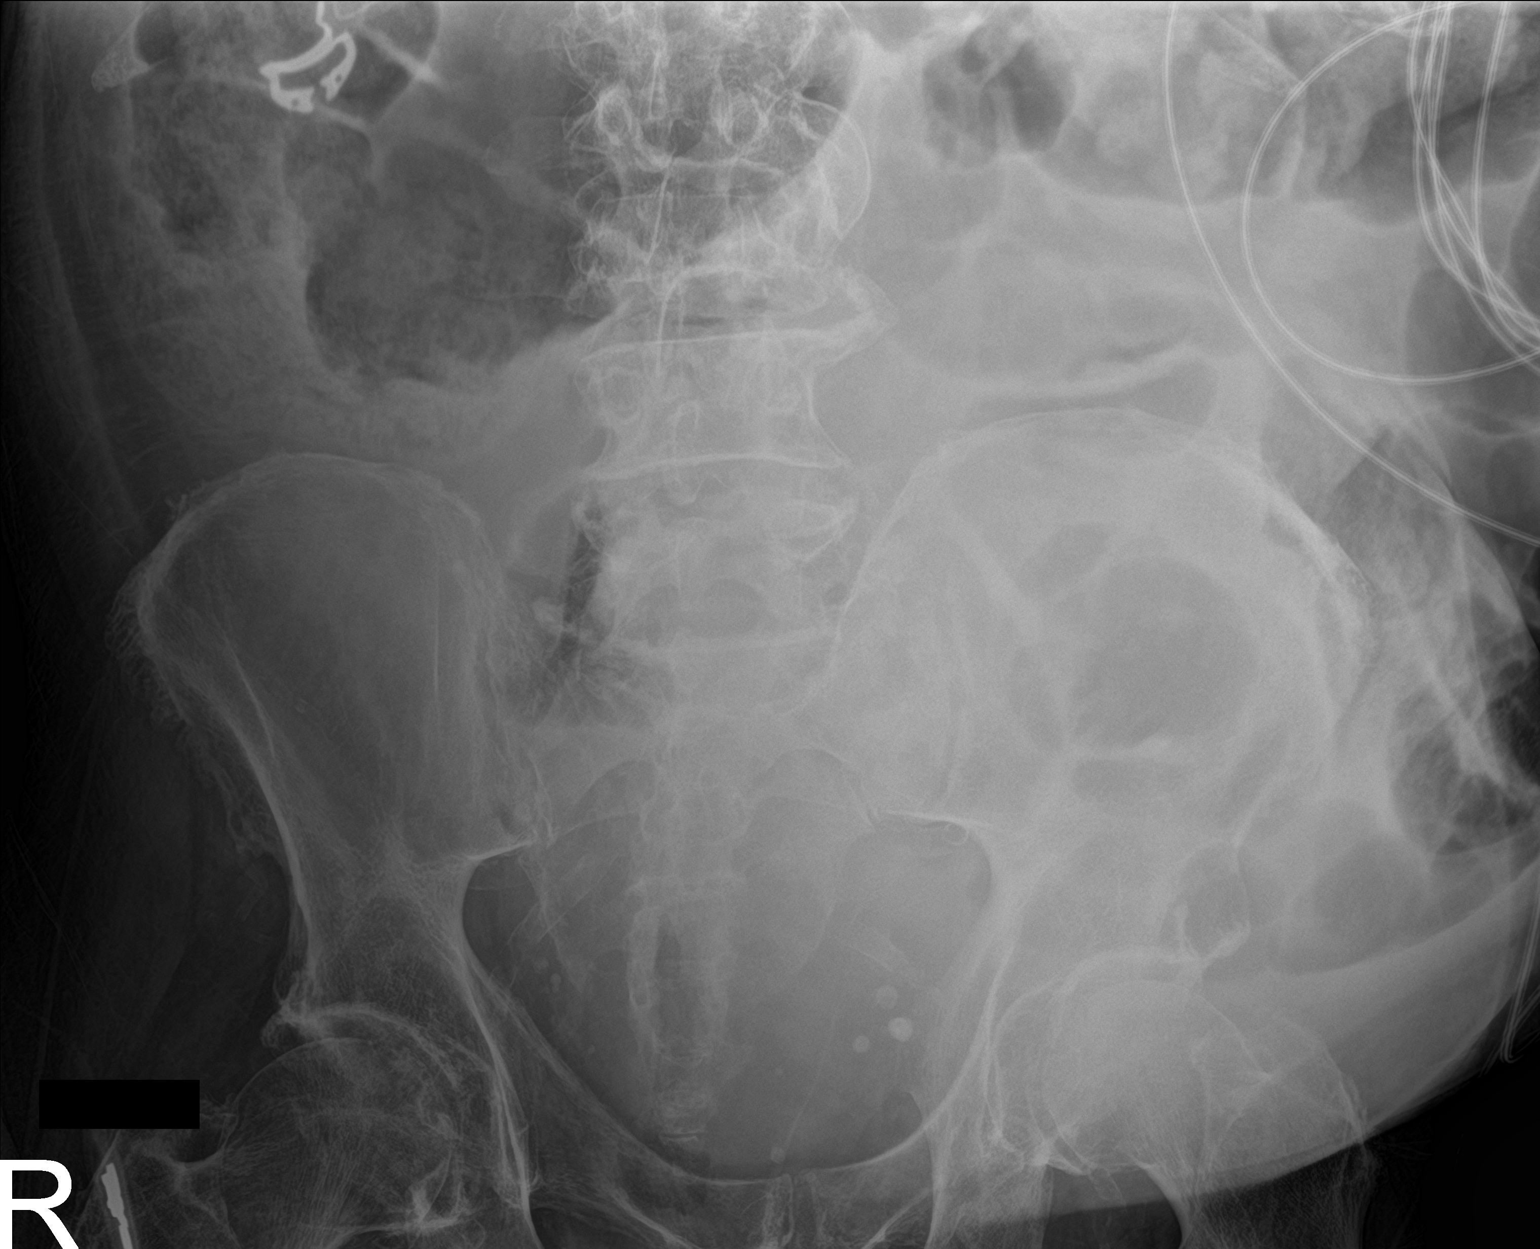

[2 of 2 positions shown; findings below may reference images not displayed]

FINDINGS: Elevated LEFT hemidiaphragm as before with signs of potential LEFT
lower lobe airspace disease.

Tortuous and distended colon in the central abdomen. Colon measuring
up to 11 cm. This could represent the cecum in the midline based on
previous imaging.

Small amount of rectal gas likely present though difficult to
determine
IMPRESSION: 1. Tortuous and distended colon in the central abdomen up to 12 cm.
Findings are concerning for worsening distal colonic obstruction in
the setting of colonic mass. CT may be helpful for further
assessment.
2. Elevated LEFT hemidiaphragm with signs of potential LEFT lower
lobe airspace disease.

## 2020-09-01 IMAGING — US US RENAL
1 series · 14 of 25 positions shown · non-contrast
Comparison: CT [DATE]

CLINICAL DATA: ATN

EXAM:
RENAL / URINARY TRACT ULTRASOUND COMPLETE

[Series 1: us renal · 57 acquisitions, 14 frames shown]
[im 1/57]
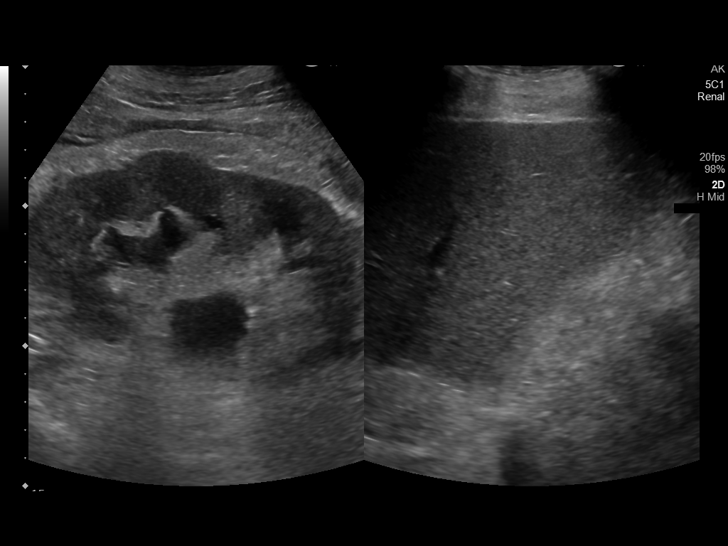
[im 5/57]
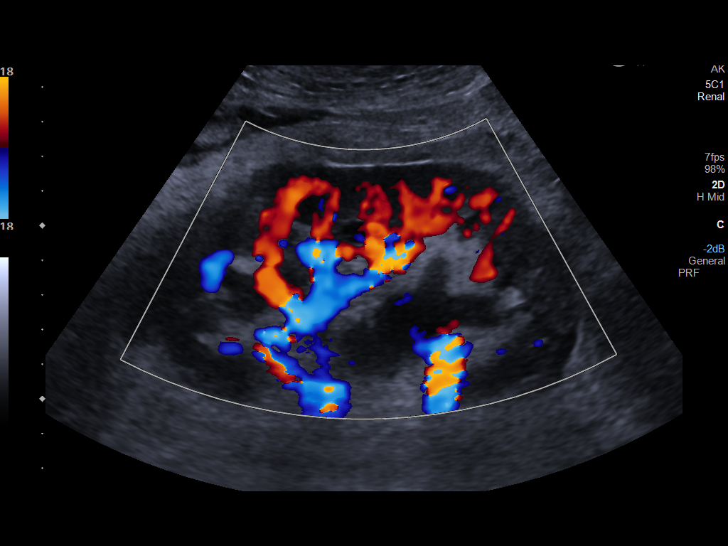
[im 10/57]
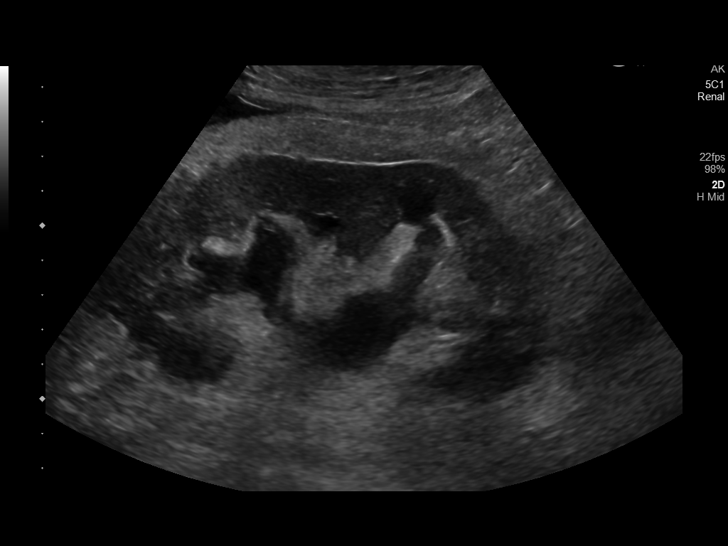
[im 15/57]
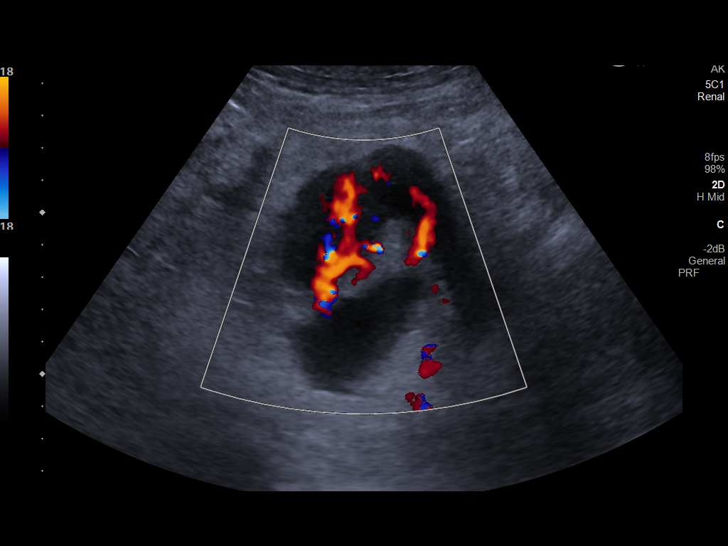
[im 19/57]
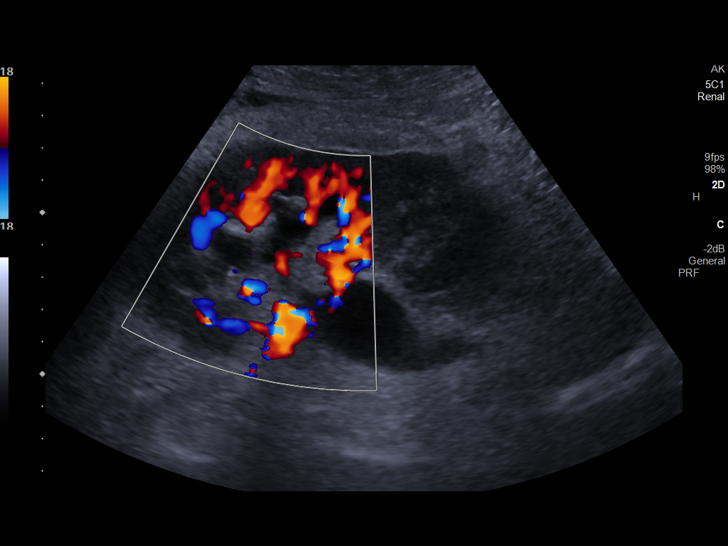
[im 22/57]
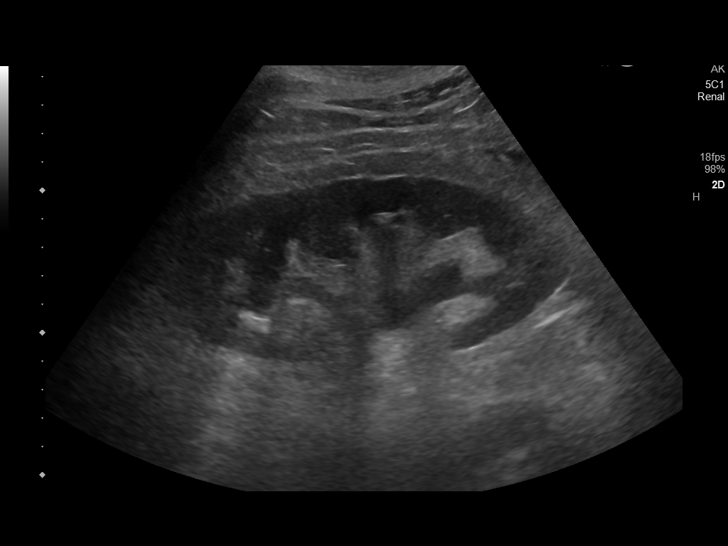
[im 26/57]
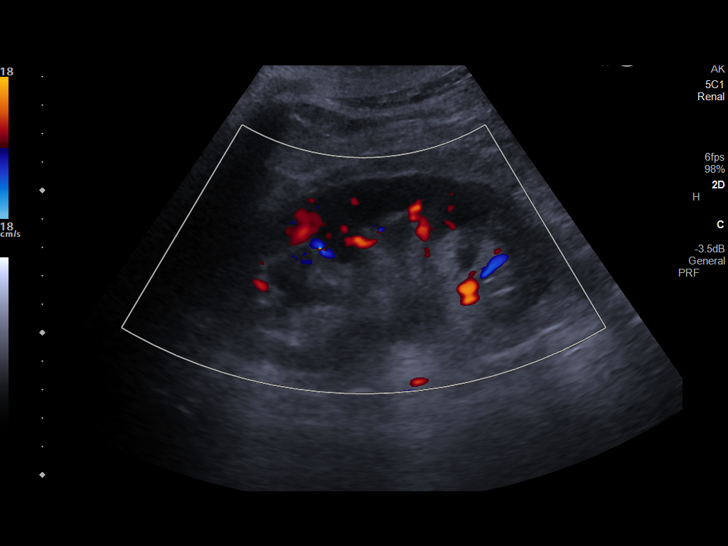
[im 31/57]
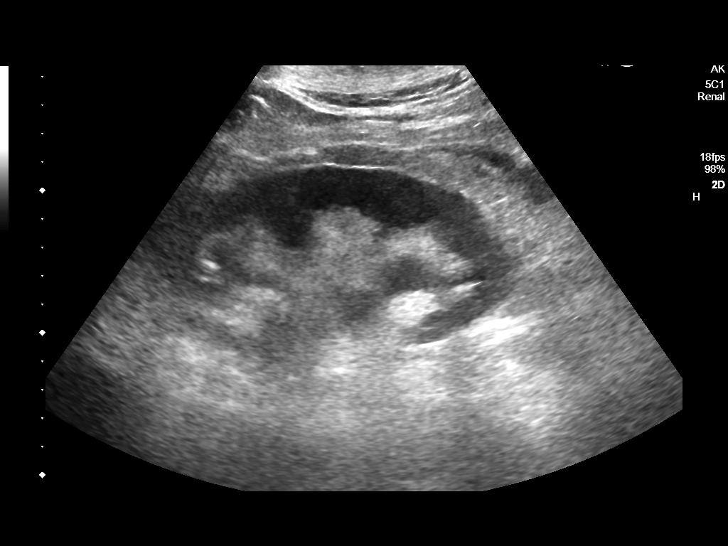
[im 36/57]
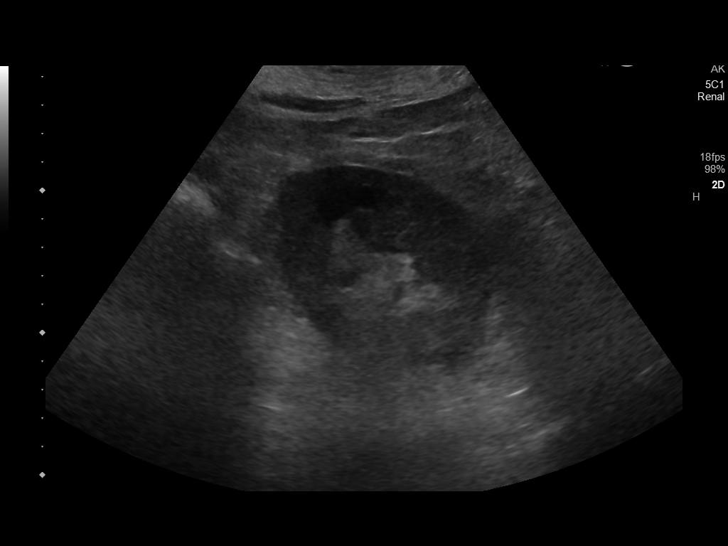
[im 38/57]
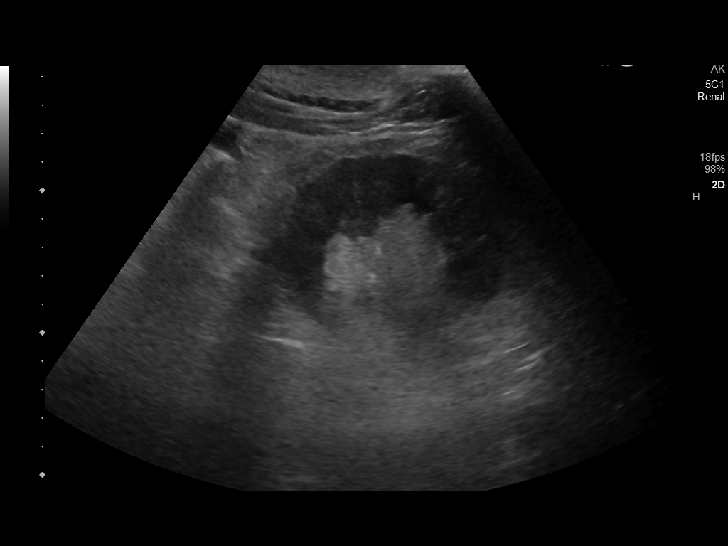
[im 43/57]
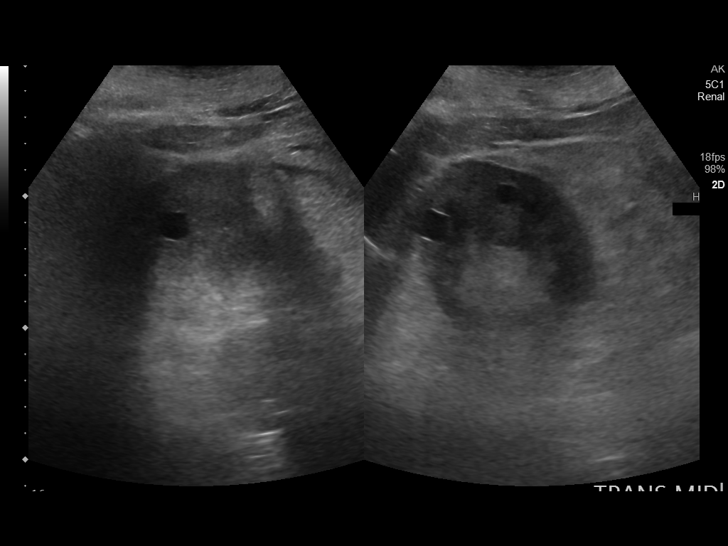
[im 47/57]
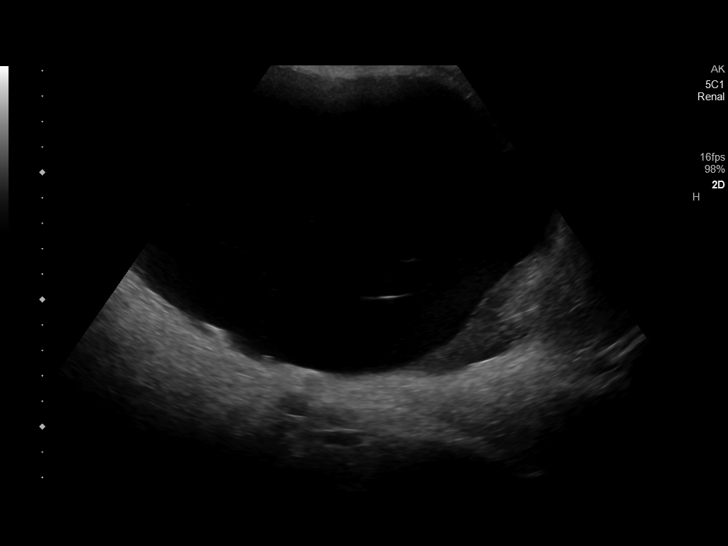
[im 52/57]
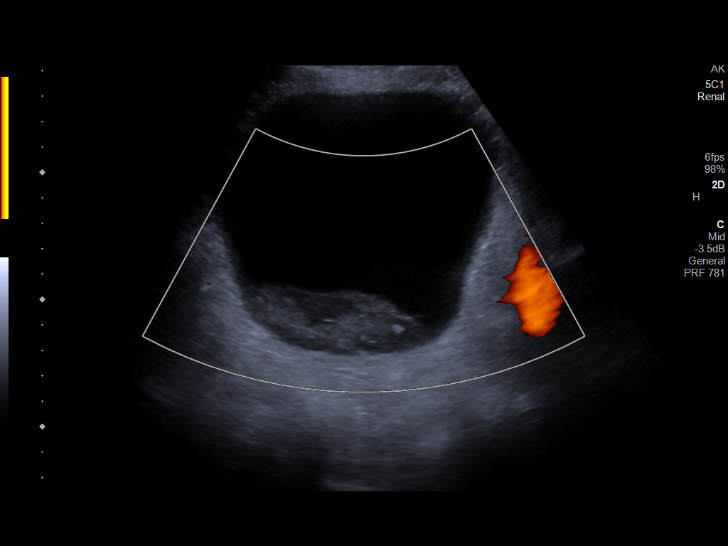
[im 57/57]
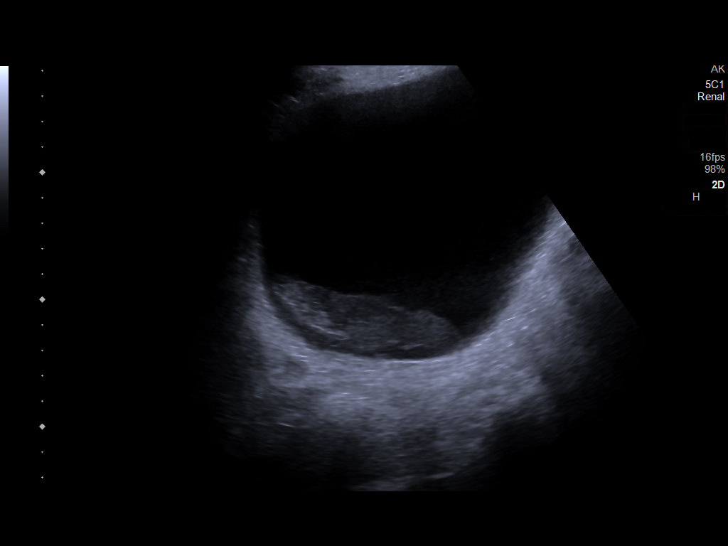

[14 of 25 positions shown; findings below may reference images not displayed]

FINDINGS: Right Kidney:

Renal measurements: 12.7 x 6.7 x 5.6 cm = volume: 252 mL. Moderate
right hydronephrosis, similar to prior CT. Normal echotexture. No
mass.

Left Kidney:

Renal measurements: 13.8 x 6.3 x 5.7 cm = volume: 260 mL. 12 mm
medial midpole cyst. Mild left hydronephrosis, similar to prior
study.

Bladder:

Layering debris within the bladder.  No bladder wall thickening.

Other:

None.
IMPRESSION: Bilateral hydronephrosis, right greater than left, similar to prior
CT.

Layering debris within the bladder.

## 2020-09-01 IMAGING — CT CT ABD-PELV W/O CM
2 of 4 series · 16 of 46 positions shown, 18 images · non-contrast
Comparison: [DATE].

CLINICAL DATA: Abdominal pain.  History of colon cancer.

EXAM:
CT ABDOMEN AND PELVIS WITHOUT CONTRAST
TECHNIQUE: Multidetector CT imaging of the abdomen and pelvis was performed
following the standard protocol without IV contrast.

[Series 3: routine abd/pel wo · axial · 0.96mm/px · z∈[-892,-432]mm · 13 of 100 slices shown, 15 images]
[im 4/100  soft-tissue]
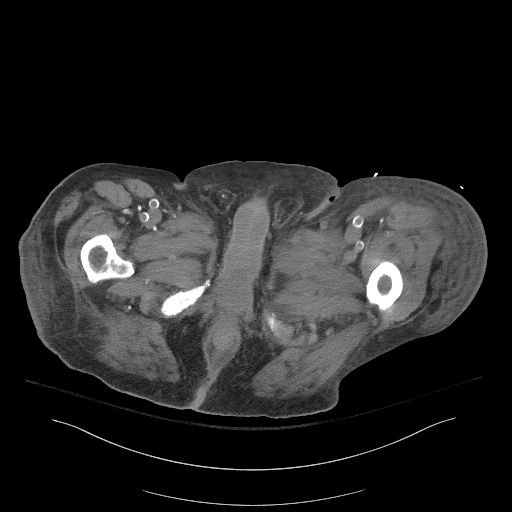
[im 4/100  bone]
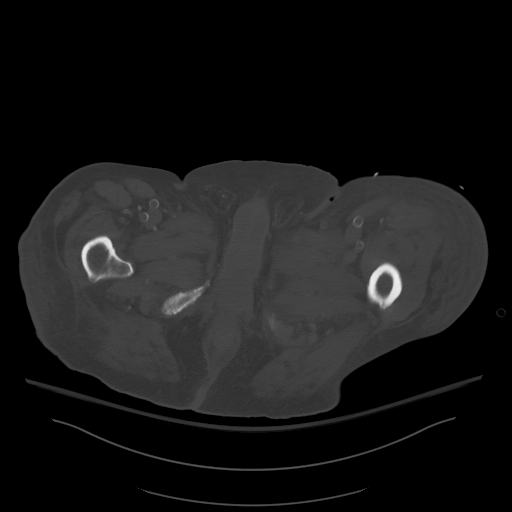
[im 12/100  soft-tissue]
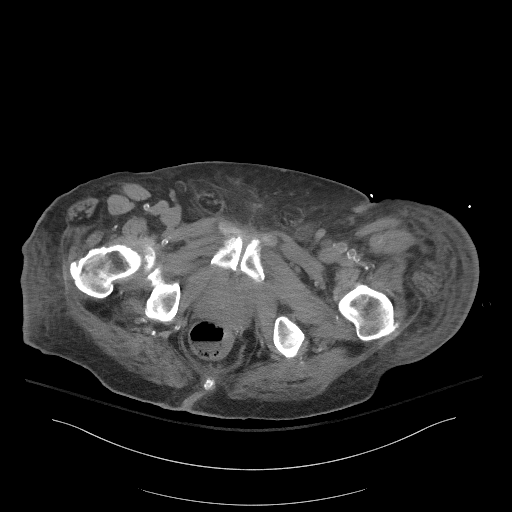
[im 20/100  soft-tissue]
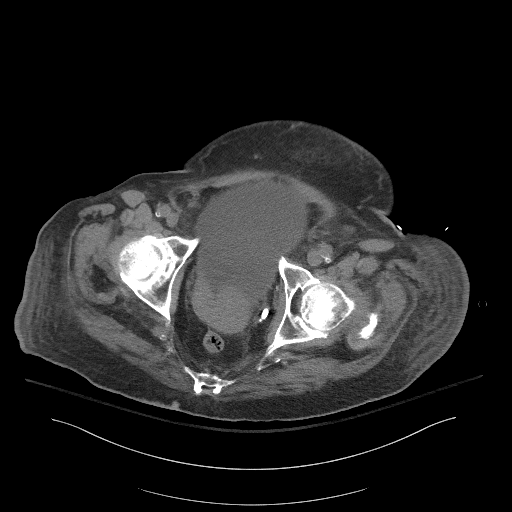
[im 28/100  soft-tissue]
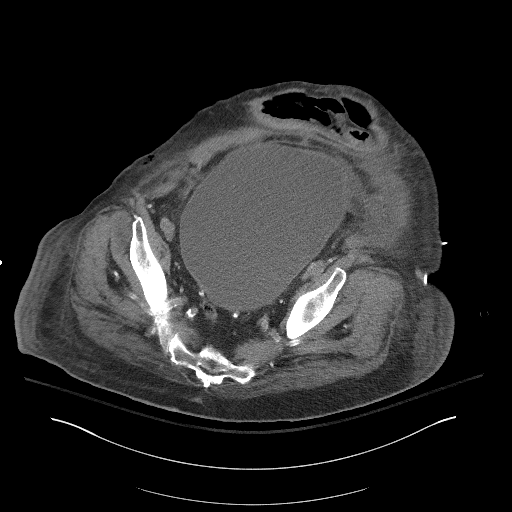
[im 36/100  soft-tissue]
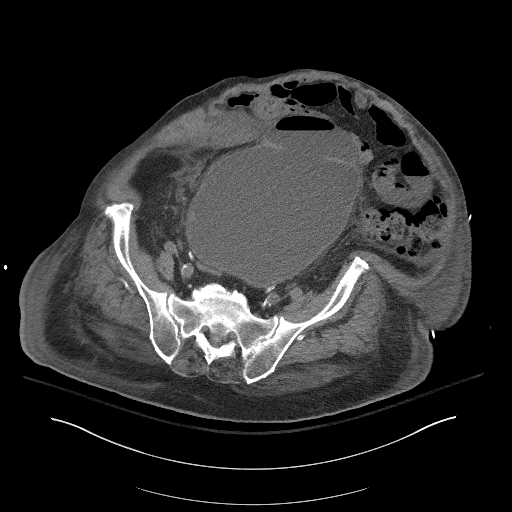
[im 44/100  soft-tissue]
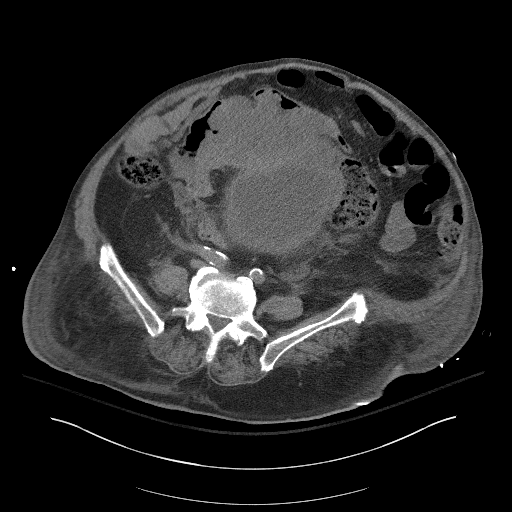
[im 52/100  soft-tissue]
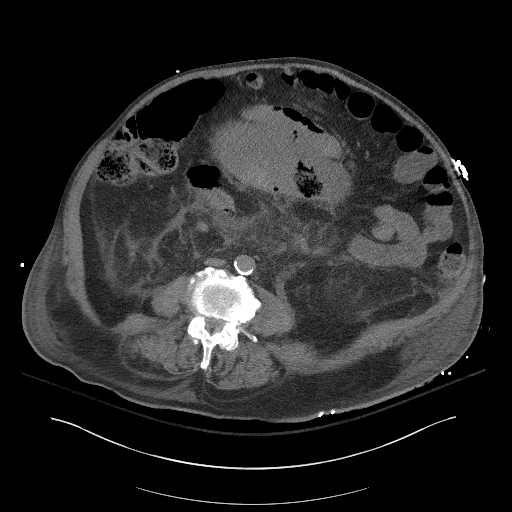
[im 56/100  soft-tissue]
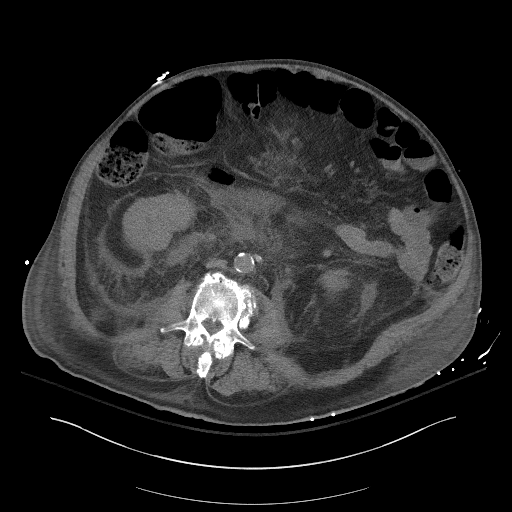
[im 64/100  soft-tissue]
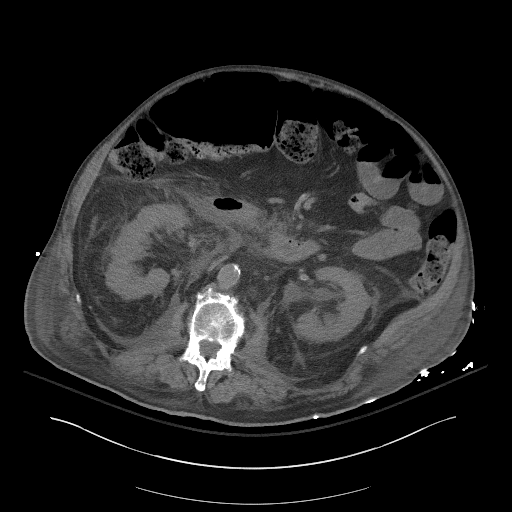
[im 64/100  bone]
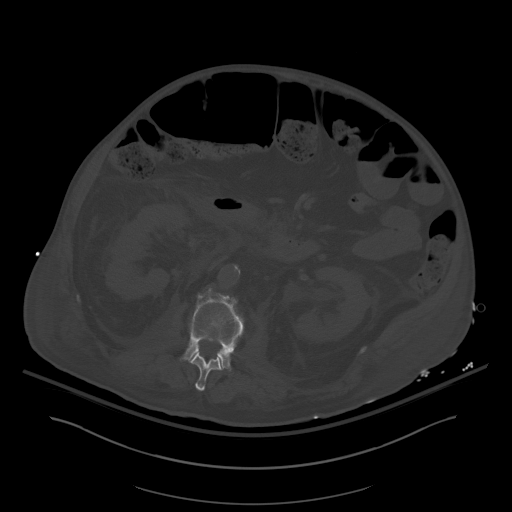
[im 72/100  soft-tissue]
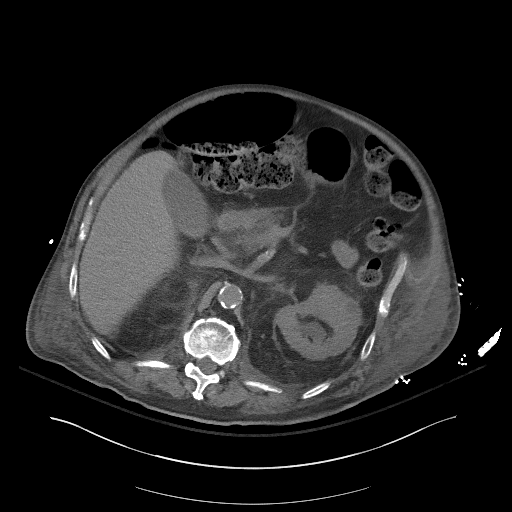
[im 80/100  soft-tissue]
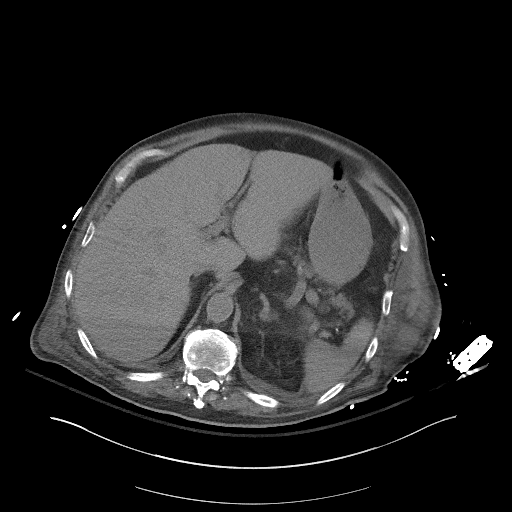
[im 88/100  soft-tissue]
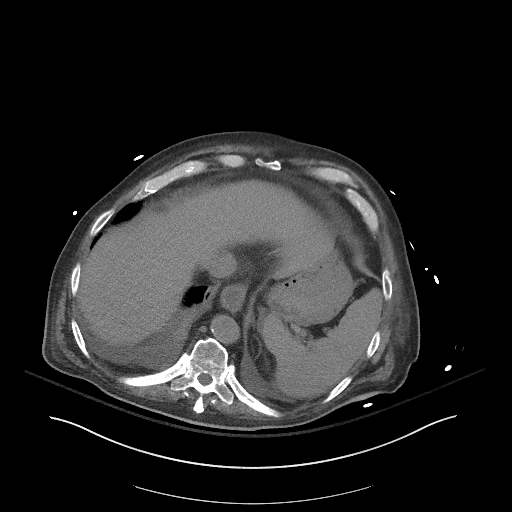
[im 96/100  soft-tissue]
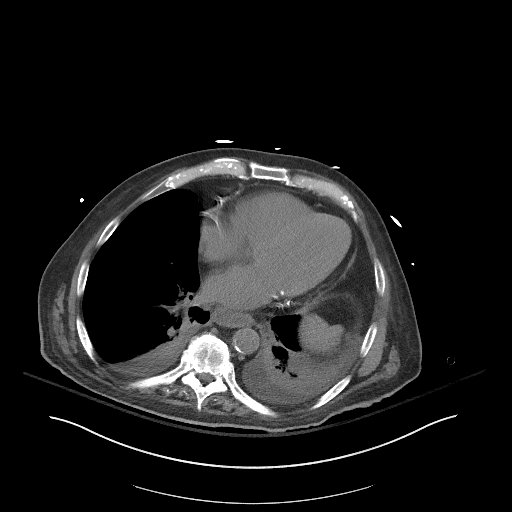

[Series 6: coronal st · coronal · 0.90mm/px · 3 of 123 slices shown]
[im 41/123  soft-tissue]
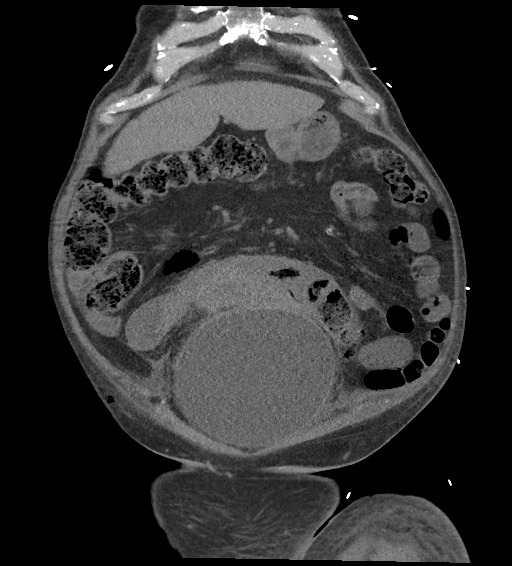
[im 55/123  soft-tissue]
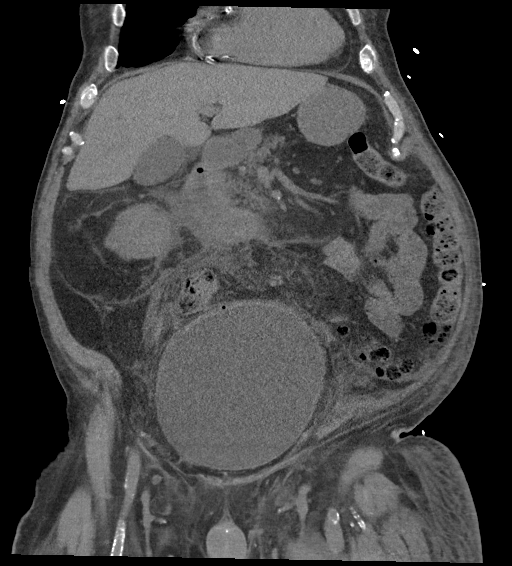
[im 68/123  soft-tissue]
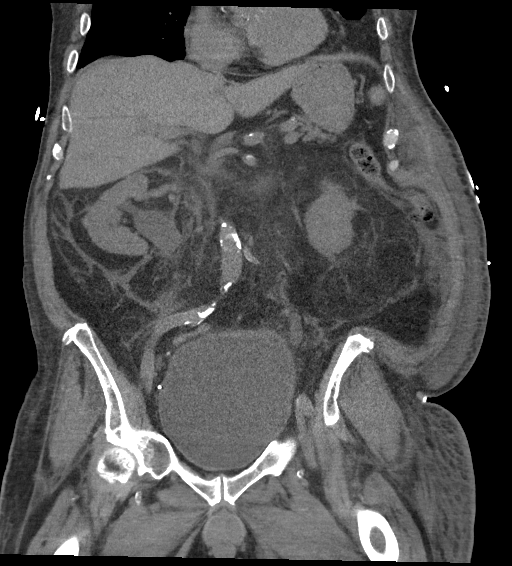

[16 of 46 positions shown; findings below may reference images not displayed]

FINDINGS: Lower chest: Left lower lobe atelectasis or pneumonia is noted with
associated pleural effusion. Small right pleural effusion is noted
with adjacent subsegmental atelectasis.

Hepatobiliary: No focal liver abnormality is seen. No gallstones,
gallbladder wall thickening, or biliary dilatation.

Pancreas: Unremarkable. No pancreatic ductal dilatation or
surrounding inflammatory changes.

Spleen: Normal in size without focal abnormality.

Adrenals/Urinary Tract: Adrenal glands appear normal. Mild bilateral
hydronephrosis is noted without obstructing calculus or ureteral
dilatation. Moderate urinary bladder distention is noted.

Stomach/Bowel: The stomach appears normal. There is no small bowel
dilatation. 16 x 6 cm mass is seen in the proximal sigmoid colon
that is causing at least partial obstruction and consistent with a
history of colonic malignancy.

Vascular/Lymphatic: Aortic atherosclerosis. No enlarged abdominal or
pelvic lymph nodes.

Reproductive: Stable moderate prostatic enlargement.

Other: There does appear to be subcutaneous edema involving the
visualized portion of the proximal left thigh.

Musculoskeletal: No acute or significant osseous findings.
IMPRESSION: Left lower lobe atelectasis or pneumonia is noted with associated
pleural effusion. Small right pleural effusion is noted with
adjacent subsegmental atelectasis.

Mild bilateral hydronephrosis is noted without obstructing calculus
or ureteral dilatation. Moderate urinary bladder distention is
noted.

16 x 6 cm mass is seen involving the proximal sigmoid colon which is
causing at least partial obstruction and consistent with the history
of colonic malignancy.

Stable moderate prostatic enlargement.

Subcutaneous edema is seen involving the visualized portion of the
proximal left thigh.

Aortic Atherosclerosis ([42]-[42]).

## 2020-09-01 MED ORDER — SODIUM CHLORIDE 0.9 % IV SOLN: INTRAVENOUS | Status: DC

## 2020-09-01 MED ORDER — CYCLOBENZAPRINE HCL 10 MG PO TABS
5.0000 mg | ORAL_TABLET | Freq: Three times a day (TID) | ORAL | Status: DC | PRN
  Administered 2020-09-02 – 2020-09-03 (×3): 5 mg via ORAL
  Filled 2020-09-01 (×3): qty 1

## 2020-09-01 MED ORDER — INSULIN ASPART 100 UNIT/ML IJ SOLN: 0.0000 [IU] | Freq: Every day | INTRAMUSCULAR | Status: DC

## 2020-09-01 MED ORDER — TAMSULOSIN HCL 0.4 MG PO CAPS
0.4000 mg | ORAL_CAPSULE | Freq: Every day | ORAL | Status: DC
  Administered 2020-09-01 – 2020-09-04 (×4): 0.4 mg via ORAL
  Filled 2020-09-01 (×4): qty 1

## 2020-09-01 MED ORDER — INSULIN ASPART 100 UNIT/ML IJ SOLN
0.0000 [IU] | Freq: Three times a day (TID) | INTRAMUSCULAR | Status: DC
  Administered 2020-09-01 (×2): 2 [IU] via SUBCUTANEOUS
  Administered 2020-09-02: 3 [IU] via SUBCUTANEOUS
  Administered 2020-09-02 – 2020-09-03 (×4): 2 [IU] via SUBCUTANEOUS
  Administered 2020-09-03: 3 [IU] via SUBCUTANEOUS
  Administered 2020-09-04 (×2): 2 [IU] via SUBCUTANEOUS
  Filled 2020-09-01 (×7): qty 1

## 2020-09-01 MED ORDER — CHLORHEXIDINE GLUCONATE CLOTH 2 % EX PADS
6.0000 | MEDICATED_PAD | Freq: Every day | CUTANEOUS | Status: DC
  Administered 2020-09-01 – 2020-09-04 (×4): 6 via TOPICAL

## 2020-09-01 NOTE — TOC Progression Note (Signed)
Transition of Care [facility]) - Progression Note    Patient Details  Name: Keith Reynolds MRN: [id number] Date of Birth: 06/14/1940  [facility] CM/SW Contact  Zigmund Daniel Dorian Pod, RN Phone Number:[phone] 09/01/2020, 3:39 PM  Clinical Narrative:    Pt has been accepted via [facility] and denied at the requested SNF ([facility]). TOC RN spoke with daughter [name] who is receptive to the accepting facility [facility] for SNF and [facility] for transport. Pt maybe discharged tomorrow if he remains stable. TOC  RN called and spoke with [facility] to requested authorization for both (pending). [facility] indicated they would call back tomorrow with this information.   TOC will remain available for discharge planning needs.    Expected Discharge Plan: [place] Barriers to Discharge: Continued Medical Work up  Expected Discharge Plan and Services Expected Discharge Plan: [place]   Discharge Planning Services: CM Consult Post Acute Care Choice: [place] Living arrangements for the past 2 months: Single Family Home                 DME Arranged: N/A DME Agency: NA       HH Arranged: NA           Social Determinants of Health (SDOH) Interventions    Readmission Risk Interventions No flowsheet data found.

## 2020-09-01 NOTE — Progress Notes (Signed)
PROGRESS NOTE  ASEEM SESSUMS  DOB: [birth year]  PCP: Sofie Hartigan, MD VVZ:[id number]  DOA: 08/29/2020  LOS: 3 days  Hospital Day: 4   Chief Complaint  Patient presents with  . Weakness  . Leg Pain    Brief narrative: Keith Reynolds is a 80 y.o. male with PMH significant for essential hypertension and recently diagnosed colon cancer, for which he has a pending follow up at [facility]. Patient presented to the ED late last night with complaint of acute onset bilateral lower extremity pain, progressively worsening for last 3 weeks associated with cramps in hands impaired mobility.  He has been having diarrhea for last 3 weeks with generalized weakness.  In the ED, blood pressure was elevated to 186/93 Labs showed sodium level low at 122, potassium low at 2.4, blood glucose level at 200, hemoglobin at 10.9, low compared to 12.1 in March.  WBC 10.5. Ultrasound duplex of both lower extremities were normal.   Patient was given potassium replacement, magnesium replacement Admitted to hospitalist service. See below for details.  Subjective: Patient was seen and examined this morning.   Wife at bedside.  Patient is awake, alert, oriented to place and person, slow to respond. Per staff, patient had 1 episode of soft bowel movement yesterday.  Assessment/Plan: Acute persistent diarrhea -Patient with diarrhea for 3 weeks.  Probably related to cancer.  Improved with Imodium as needed.  -GI pathogen panel normal.  Adequately hydrated.  Encourage oral hydration -Continue to monitor bowel movement.  #Abdominal distention and tenderness -Insert distended abdomen, with firmness and mild tenderness. -Abdominal x-ray was obtained this morning which showed worsening colonic distention.  CT abdomen was suggested.  Ordered for that.  Severe hypokalemia Hypomagnesemia Hypophosphatemia -Secondary to persistent diarrhea.  IV and oral replacement given.  Levels normalized with  replacement -Repeat labs intermittently. Recent Labs  Lab 08/29/20 0143 08/29/20 0914 08/29/20 1042 08/30/20 0104 08/31/20 0516 09/01/20 0442  K 2.4* 3.2* 3.3* 3.4* 4.2 4.2  MG 1.7  --   --  1.9  --   --   PHOS  --  2.4*  --  3.8  --   --    AKI -Creatinine gradually worsening, 1.4 this morning.  Not sure if urine output is currently documented. -I will start on normal saline 75 mill per hour this morning -Nephrology consultation called for coexisting hyponatremia. Recent Labs    08/29/20 0143 08/29/20 0914 08/29/20 1042 08/30/20 0104 08/31/20 0516 09/01/20 0442  BUN 16 15 15 21  34* 45*  CREATININE 0.41* 0.49* 0.48* 0.62 1.12 1.40*   Severe hyponatremia -Sodium level low at 122 at presentation.  My initial suspicion was hypovolemic hyponatremia due to poor oral intake and hydration.  However despite adequate hydration and clinical improvement, sodium level has not improved and is static at 123.  Could be SIADH but urine osmolality seems normal at 330.   -Nephrology consultation called.  Started the patient back on home Synthroid because of AKI. Recent Labs  Lab 08/29/20 0143 08/29/20 0914 08/29/20 1042 08/30/20 0104 08/31/20 0516 09/01/20 0442  NA 122* 124* 123* 123* 123* 122*   Generalized weakness Muscle cramps -Probably due to electrolyte abnormalities and dehydration. Flexeril as needed for muscle spasm. -Encourage ambulation. PT eval appreciated.  SNF recommended  Recent diagnosis of colon cancer -Noted a plan to follow-up at [facility]. Appointment postponed to next week.  Mild chronic microcytic anemia -Hemoglobin level stable.    Dyslipidemia -Statin  Bilateral pedal edema -Intermittent  for last several months.  More prominent now.  Albumin level at 3. -Ultrasound duplex lower extremities negative.  Echocardiogram with EF normal at 60 to 37%, grade 1 diastolic dysfunction. -Apply Ace wrap from both legs.  Recommend leg elevation.  Impaired  mobility/generalized weakness -PT eval obtained.  SNF recommended.  Mobility: Encourage ambulation Code Status:   Code Status: Full Code  Nutritional status: Body mass index is 32.73 kg/m. Nutrition Problem: Moderate Malnutrition (in the context of chronic illness) Etiology: cancer and cancer related treatments Signs/Symptoms: moderate fat depletion,mild muscle depletion,moderate muscle depletion Diet Order            Diet Carb Modified Fluid consistency: Thin; Room service appropriate? Yes  Diet effective now                 DVT prophylaxis: enoxaparin (LOVENOX) injection 40 mg Start: 08/29/20 0800   Antimicrobials:  None Fluid: Normal saline at 75 mill per hour we started Consultants: None Family Communication:  Wife at bedside  Status is: Inpatient  Remains inpatient appropriate because: Continues to have low sodium level.  Work-up continued Dispo: The patient is from: Home              Anticipated d/c is to: SNF in 2 to 3 days              Patient currently is not medically stable to d/c.   Difficult to place patient No     Infusions:  . sodium chloride 75 mL/hr at 09/01/20 0907    Scheduled Meds: . enoxaparin (LOVENOX) injection  40 mg Subcutaneous Q24H  . feeding supplement  237 mL Oral TID BM  . insulin aspart  0-5 Units Subcutaneous QHS  . insulin aspart  0-9 Units Subcutaneous TID WC  . multivitamin with minerals  1 tablet Oral Daily  . nystatin   Topical BID    Antimicrobials: Anti-infectives (From admission, onward)   None      PRN meds: acetaminophen **OR** acetaminophen, cyclobenzaprine, loperamide, magnesium hydroxide, promethazine, traZODone   Objective: Vitals:   09/01/20 0548 09/01/20 0828  BP: (!) 145/73 (!) 149/67  Pulse: 86 84  Resp: 19 16  Temp: 97.9 F (36.6 C) 98.2 F (36.8 C)  SpO2: 98% 94%    Intake/Output Summary (Last 24 hours) at 09/01/2020 1023 Last data filed at 09/01/2020 1015 Gross per 24 hour  Intake 840 ml   Output 351 ml  Net 489 ml   Filed Weights   08/29/20 0125 08/30/20 1041  Weight: 63.5 kg 94.8 kg   Weight change:  Body mass index is 32.73 kg/m.   Physical Exam: General exam: Pleasant, elderly Caucasian male.  Not in physical distress.  No muscle cramp.  Skin: No rashes, lesions or ulcers. HEENT: Atraumatic, normocephalic, no obvious bleeding Lungs: Clear to auscultation bilaterally CVS: Regular rate and rhythm, no murmur GI/Abd distended abdomen this morning with firmness and mild tenderness.   CNS: More awake today, oriented to place and person.  Slow to respond.  Hard of hearing at baseline. Psychiatry: Mood appropriate Extremities: 1+ bilateral pedal edema, no calf tenderness  Data Review: I have personally reviewed the laboratory data and studies available.  Recent Labs  Lab 08/29/20 0143 08/29/20 0914 08/29/20 1843 08/30/20 0104 08/31/20 0516 09/01/20 0442  WBC 10.5 10.5  --  11.2* 14.4* 12.0*  NEUTROABS 8.0*  --   --  9.2* 12.0* 9.7*  HGB 10.9* 11.0* 12.2* 11.0* 11.0* 10.8*  HCT 31.9* 31.6* 35.5* 32.5*  32.6* 31.6*  MCV 77.6* 77.6*  --  78.7* 79.5* 79.2*  PLT 252 254  --  229 253 270   Recent Labs  Lab 08/29/20 0143 08/29/20 0914 08/29/20 1042 08/30/20 0104 08/31/20 0516 09/01/20 0442  NA 122* 124* 123* 123* 123* 122*  K 2.4* 3.2* 3.3* 3.4* 4.2 4.2  CL 78* 81* 80* 83* 86* 85*  CO2 31 31 30 31 29 27   GLUCOSE 200* 202* 217* 209* 184* 204*  BUN 16 15 15 21  34* 45*  CREATININE 0.41* 0.49* 0.48* 0.62 1.12 1.40*  CALCIUM 8.5* 8.3* 8.3* 8.1* 8.0* 8.0*  MG 1.7  --   --  1.9  --   --   PHOS  --  2.4*  --  3.8  --   --     F/u labs ordered Unresulted Labs (From admission, onward)          Start     Ordered   09/01/20 0804  Hemoglobin A1c  Add-on,   AD       Comments: To assess prior glycemic control   Question:  Specimen collection method  Answer:  Lab=Lab collect   09/01/20 0803   Unscheduled  Occult blood card to lab, stool  As needed,   STAT       08/29/20 0543          Signed, Terrilee Croak, MD [facility] 09/01/2020

## 2020-09-01 NOTE — Consult Note (Signed)
Keith Reynolds MRN: [id number] DOB/AGE: 80/16/1942 80 y.o. Primary Care Physician:Feldpausch, Chrissie Noa, MD Admit date: 08/29/2020 Chief Complaint:  Chief Complaint  Patient presents with  . Weakness  . Leg Pain   HPI: Keith Reynolds is a 80 y.o. Caucasian male with medical history significant for essential hypertension and recently diagnosed colon cancer, for which he will follow at [facility] presented to the ER with acute onset of bilateral lower extremity pain mainly in his legs which has been going on over the last 3 weeks and has significantly worsened tonight with associated cramps in both legs. Upon evaluation in  ER pt was found to have blood pressure was 186/93 with hyponatremia, hypokalemia and hypophosphatemia and  Anemia.   Nephrology was consulted for hyponatremia and acute kidney injury. Patient was seen today on second floor.   Patient main complaint at the time of admission was weakness of lower extremities. Patient explains that while ambulating with his walker his legs give out on him and he fell without injuries.   Patient complains of generalized weakness Patient main concern continues to be diarrhea.  Patient explained that he is got diarrhea from past few weeks No complaint of black stools No complaint of bright red blood per rectum No complaint of emesis No complaint of chest pain No complaint of fever/cough/chills No complaint of blood in stools or blood in urine Patient gave a history that he has had low sodium for a long time     Past Medical History:  Diagnosis Date  . Colon cancer [facility])    reported by patient and wife  . Fracture    right forearm, tibia/fibula  . Hernia, abdominal   . Hypertension         Family History  Problem Relation Age of Onset  . Heart attack Father   . Diabetes Sister     Social History:  reports that he quit smoking about 37 years ago. He smoked 0.25 packs per day. He has never used smokeless  tobacco. He reports that he does not drink alcohol and does not use drugs.   Allergies:  Allergies  Allergen Reactions  . Antihistamines, Diphenhydramine-Type   . Bactrim [Sulfamethoxazole-Trimethoprim] Nausea Only  . Chamomile Other (See Comments)  . Diphenhydramine Hcl Other (See Comments)    dizziness  . Gabapentin   . Levomenol   . Pseudoephedrine Hcl Other (See Comments)    dizziness  . Rosuvastatin   . Triprolidine Hcl Other (See Comments)    dizziness    Medications Prior to Admission  Medication Sig Dispense Refill  . atorvastatin (LIPITOR) 10 MG tablet Take 10 mg by mouth daily. (Patient not taking: Reported on 07/22/2020)    . lisinopril (PRINIVIL,ZESTRIL) 5 MG tablet Take 1 tablet (5 mg total) by mouth daily. (Patient not taking: Reported on 07/22/2020) 30 tablet 2       UVO:ZDGUY from the symptoms mentioned above,there are no other symptoms referable to all systems reviewed.  . enoxaparin (LOVENOX) injection  40 mg Subcutaneous Q24H  . feeding supplement  237 mL Oral TID BM  . insulin aspart  0-5 Units Subcutaneous QHS  . insulin aspart  0-9 Units Subcutaneous TID WC  . multivitamin with minerals  1 tablet Oral Daily  . nystatin   Topical BID       Physical Exam: Vital signs in last 24 hours: Temp:  [97.8 F (36.6 C)-99.3 F (37.4 C)] 98.4 F (36.9 C) (05/22 1136) Pulse Rate:  [84-90]  88 (05/22 1136) Resp:  [14-19] 17 (05/22 1136) BP: (126-149)/(60-73) 127/72 (05/22 1136) SpO2:  [94 %-98 %] 96 % (05/22 1136) Weight change:  Last BM Date: 08/30/20  Intake/Output from previous day: 05/21 0701 - 05/22 0700 In: 600 [P.O.:600] Out: 351 [Urine:350; Stool:1] Total I/O In: 240 [P.O.:240] Out: -    Physical Exam: General- pt is awake,alert, oriented to time place and person Resp- No acute REsp distress, CTA B/L NO Rhonchi CVS- S1S2 regular in rate and rhythm GIT- BS+, distention  EXT- trace LE Edema, no  Cyanosis CNS- CN 2-12 grossly intact.  Moving all 4 extremities Psych- normal mood and affect    Lab Results: CBC Recent Labs    08/31/20 0516 09/01/20 0442  WBC 14.4* 12.0*  HGB 11.0* 10.8*  HCT 32.6* 31.6*  PLT 253 270    BMET Recent Labs    08/31/20 0516 09/01/20 0442  NA 123* 122*  K 4.2 4.2  CL 86* 85*  CO2 29 27  GLUCOSE 184* 204*  BUN 34* 45*  CREATININE 1.12 1.40*  CALCIUM 8.0* 8.0*    MICRO Recent Results (from the past 240 hour(s))  Resp Panel by RT-PCR (Flu A&B, Covid) Nasopharyngeal Swab     Status: None   Collection Time: 08/29/20  4:47 AM   Specimen: Nasopharyngeal Swab; Nasopharyngeal(NP) swabs in vial transport medium  Result Value Ref Range Status   SARS Coronavirus 2 by RT PCR NEGATIVE NEGATIVE Final    Comment: (NOTE) SARS-CoV-2 target nucleic acids are NOT DETECTED.  The SARS-CoV-2 RNA is generally detectable in upper respiratory specimens during the acute phase of infection. The lowest concentration of SARS-CoV-2 viral copies this assay can detect is 138 copies/mL. A negative result does not preclude SARS-Cov-2 infection and should not be used as the sole basis for treatment or other patient management decisions. A negative result may occur with  improper specimen collection/handling, submission of specimen other than nasopharyngeal swab, presence of viral mutation(s) within the areas targeted by this assay, and inadequate number of viral copies(<138 copies/mL). A negative result must be combined with clinical observations, patient history, and epidemiological information. The expected result is Negative.  Fact Sheet for Patients:  EntrepreneurPulse.com.au  Fact Sheet for Healthcare Providers:  IncredibleEmployment.be  This test is no t yet approved or cleared by the [place] FDA and  has been authorized for detection and/or diagnosis of SARS-CoV-2 by FDA under an Emergency Use Authorization (EUA). This EUA will remain  in effect  (meaning this test can be used) for the duration of the COVID-19 declaration under Section 564(b)(1) of the Act, 21 U.S.C.section 360bbb-3(b)(1), unless the authorization is terminated  or revoked sooner.       Influenza A by PCR NEGATIVE NEGATIVE Final   Influenza B by PCR NEGATIVE NEGATIVE Final    Comment: (NOTE) The Xpert Xpress SARS-CoV-2/FLU/RSV plus assay is intended as an aid in the diagnosis of influenza from Nasopharyngeal swab specimens and should not be used as a sole basis for treatment. Nasal washings and aspirates are unacceptable for Xpert Xpress SARS-CoV-2/FLU/RSV testing.  Fact Sheet for Patients: EntrepreneurPulse.com.au  Fact Sheet for Healthcare Providers: IncredibleEmployment.be  This test is not yet approved or cleared by the [place] FDA and has been authorized for detection and/or diagnosis of SARS-CoV-2 by FDA under an Emergency Use Authorization (EUA). This EUA will remain in effect (meaning this test can be used) for the duration of the COVID-19 declaration under Section 564(b)(1) of the Act, 21 U.S.C.  section 360bbb-3(b)(1), unless the authorization is terminated or revoked.  Performed at [facility], [address] [place], [place] [address]   Gastrointestinal Panel by PCR , Stool     Status: None   Collection Time: 08/29/20 10:42 AM   Specimen: Stool  Result Value Ref Range Status   Campylobacter species NOT DETECTED NOT DETECTED Final   Plesimonas shigelloides NOT DETECTED NOT DETECTED Final   Salmonella species NOT DETECTED NOT DETECTED Final   Yersinia enterocolitica NOT DETECTED NOT DETECTED Final   Vibrio species NOT DETECTED NOT DETECTED Final   Vibrio cholerae NOT DETECTED NOT DETECTED Final   Enteroaggregative E coli (EAEC) NOT DETECTED NOT DETECTED Final   Enteropathogenic E coli (EPEC) NOT DETECTED NOT DETECTED Final   Enterotoxigenic E coli (ETEC) NOT DETECTED NOT DETECTED Final    Shiga like toxin producing E coli (STEC) NOT DETECTED NOT DETECTED Final   Shigella/Enteroinvasive E coli (EIEC) NOT DETECTED NOT DETECTED Final   Cryptosporidium NOT DETECTED NOT DETECTED Final   Cyclospora cayetanensis NOT DETECTED NOT DETECTED Final   Entamoeba histolytica NOT DETECTED NOT DETECTED Final   Giardia lamblia NOT DETECTED NOT DETECTED Final   Adenovirus F40/41 NOT DETECTED NOT DETECTED Final   Astrovirus NOT DETECTED NOT DETECTED Final   Norovirus GI/GII NOT DETECTED NOT DETECTED Final   Rotavirus A NOT DETECTED NOT DETECTED Final   Sapovirus (I, II, IV, and V) NOT DETECTED NOT DETECTED Final    Comment: Performed at [facility], [address] [place], [place] [address]      Lab Results  Component Value Date   CALCIUM 8.0 (L) 09/01/2020   PHOS 3.8 08/30/2020      Impression: Keith Reynolds is a 80 y.o. Caucasian male with medical history significant for essential hypertension and recently diagnosed colon cancer, for which he will follow at [facility] presented to the ER with acute onset of bilateral lower extremity pain mainly in his legs which has been going on over the last 3 weeks and has significantly worsened tonight with associated cramps in both legs. Who was admitted with -Acute kidney injury Hypokalemia Hyponatremia Generalized weakness Anemia Hypomagnesemia Abdominal distention and tenderness Acute persistent diarrhea   1)Renal    AKI secondary to ATN AKI secondary  to Hypovolemia? + ACE  Creat 0.6==>1.1==>1.4 Patient has AKI on CKD Patient has CKD secondary to diabetes mellitus Possible contribution from hypertension Data in favor of CKD as patient has got microalbuminuria high at 66 going back to 2020  2)HTN  Blood pressure is stable for the acute state   3)Anemia of chronic disease  CBC Latest Ref Rng & Units 09/01/2020 08/31/2020 08/30/2020  WBC 4.0 - 10.5 K/uL 12.0(H) 14.4(H) 11.2(H)  Hemoglobin 13.0 - 17.0  g/dL 10.8(L) 11.0(L) 11.0(L)  Hematocrit 39.0 - 52.0 % 31.6(L) 32.6(L) 32.5(L)  Platelets 150 - 400 K/uL 270 253 229       HGb at goal (9--11)   4) Proteinuria Stable Patient was on RAS blockers as an outpatient We will continue to hold for now  5) colon cancer Patient recently diagnosed with colon cancer This is being followed at [facility]   6) Electrolytes   BMP Latest Ref Rng & Units 09/01/2020 08/31/2020 08/30/2020  Glucose 70 - 99 mg/dL 204(H) 184(H) 209(H)  BUN 8 - 23 mg/dL 45(H) 34(H) 21  Creatinine 0.61 - 1.24 mg/dL 1.40(H) 1.12 0.62  BUN/Creat Ratio 10 - 24 - - -  Sodium 135 - 145  mmol/L 122(L) 123(L) 123(L)  Potassium 3.5 - 5.1 mmol/L 4.2 4.2 3.4(L)  Chloride 98 - 111 mmol/L 85(L) 86(L) 83(L)  CO2 22 - 32 mmol/L 27 29 31   Calcium 8.9 - 10.3 mg/dL 8.0(L) 8.0(L) 8.1(L)     Sodium Hyponatremia Patient has chronic hyponatremia On review of data from care everywhere patient does have a low sodium going back to 2020 Component 06/26/20 03/14/20 09/14/19 09/07/19 03/13/19 05/16/18  Glucose 127High 139High 169 177High 155High 162High  Sodium 133Low 132Low 135 129Low 133Low 137      Patient has chronic hyponatremia with recent exacerbation secondary to multiple factors Hypovolemic hyponatremia   Results for Keith Reynolds, Keith Reynolds (MRN [id number]) as of 09/01/2020 11:52  Ref. Range 05/28/2017 00:00 05/28/2017 12:03 05/28/2017 12:28 06/30/2017 10:55 07/12/2020 14:24 07/18/2020 08:55 07/22/2020 15:05 07/22/2020 15:25 08/29/2020 01:42 08/29/2020 01:43 08/29/2020 01:44 08/29/2020 04:47 08/29/2020 05:59 08/29/2020 09:14 08/29/2020 10:42 08/29/2020 12:11 08/29/2020 18:43 08/29/2020 22:34 08/30/2020 01:04 08/31/2020 05:16 08/31/2020 08:45 09/01/2020 04:42 09/01/2020 08:02 09/01/2020 08:02 09/01/2020 10:23 09/01/2020 11:38  Osmolality, Urine Latest Ref Range: 300 - 900 mOsm/kg                     330       Sodium, Urine Latest Units: mmol/L                       <10      Data in favor  of hypovolemic hyponatremia as -Low urine sodium -History of chronic diarrhea  Data in favor of contribution from SIADH Sodium should have improved with IV fluids Patient history of colon cancer   Potassium Normokalemic    7)Acid base   Co2 at goal         Plan:  Acute kidney injury We will ask for renal ultrasound Agree with current IV fluids We will ask for strict I/O's We will hold off on starting autoimmune work-up for now   Hyponatremia We will repeat patient's urine sodium and osmolality in the morning We will continue to follow the serum sodium every 8 hours    Anemia of chronic disease No need for Epogen   Colon cancer Primary team is following      Johniece Hornbaker s Alisson Rozell 09/01/2020, 1:29 PM

## 2020-09-02 DIAGNOSIS — E876 Hypokalemia: Secondary | ICD-10-CM | POA: Diagnosis not present

## 2020-09-02 LAB — SODIUM
Sodium: 126 mmol/L — ABNORMAL LOW (ref 135–145)
Sodium: 129 mmol/L — ABNORMAL LOW (ref 135–145)

## 2020-09-02 LAB — GLUCOSE, CAPILLARY
Glucose-Capillary: 219 mg/dL — ABNORMAL HIGH (ref 70–99)
Glucose-Capillary: 191 mg/dL — ABNORMAL HIGH (ref 70–99)
Glucose-Capillary: 181 mg/dL — ABNORMAL HIGH (ref 70–99)
Glucose-Capillary: 149 mg/dL — ABNORMAL HIGH (ref 70–99)

## 2020-09-02 LAB — BASIC METABOLIC PANEL
Anion gap: 8 (ref 5–15)
BUN: 27 mg/dL — ABNORMAL HIGH (ref 8–23)
CO2: 30 mmol/L (ref 22–32)
Calcium: 8 mg/dL — ABNORMAL LOW (ref 8.9–10.3)
Chloride: 89 mmol/L — ABNORMAL LOW (ref 98–111)
Creatinine, Ser: 0.78 mg/dL (ref 0.61–1.24)
GFR, Estimated: >60 mL/min (ref >60)
Glucose, Bld: 225 mg/dL — ABNORMAL HIGH (ref 70–99)
Potassium: 3.7 mmol/L (ref 3.5–5.1)
Sodium: 127 mmol/L — ABNORMAL LOW (ref 135–145)

## 2020-09-02 LAB — OSMOLALITY, URINE: Osmolality, Ur: 371 mOsm/kg (ref 300–900)

## 2020-09-02 NOTE — ACP (Advance Care Planning) (Signed)
Chaplain Tylik Treese completed an Forensic scientist for [name]. Fernande Boyden, room 1C-108. [place] is Keith Reynolds. Ph# [phone].

## 2020-09-02 NOTE — Care Management Important Message (Signed)
Important Message  Patient Details  Name: Keith Reynolds MRN: [id number] Date of Birth: April 20, 1940   Medicare Important Message Given:  Yes     Dannette Barbara 09/02/2020, 12:19 PM

## 2020-09-02 NOTE — Progress Notes (Signed)
[facility]  ROUNDING NOTE   Subjective:  [name] is a 80 y.o.  male with essential hypertension and colon cancer recently diagnosed , who was admitted to [facility] on 08/29/2020 for generalized weakness.  Patient is seen resting in bed with wife and daughter at bedside Alert and oriented Patient states his appetite has decreased and family states he has been drinking about 3 Ensures a day. He was able to eat breakfast.  Denies nausea Continues to have diarrhea but has improved this morning Denies shortness of breath Continues to have fatigue  Objective:  Vital signs in last 24 hours:  Temp:  [97.8 F (36.6 C)-98.9 F (37.2 C)] 98.9 F (37.2 C) (05/23 1156) Pulse Rate:  [83-99] 99 (05/23 1156) Resp:  [14-20] 16 (05/23 1156) BP: (110-156)/(51-71) 124/58 (05/23 1156) SpO2:  [94 %-100 %] 99 % (05/23 1156)  Weight change:  Filed Weights   08/29/20 0125 08/30/20 1041  Weight: 63.5 kg 94.8 kg    Intake/Output: I/O last 3 completed shifts: In: 927.7 [P.O.:480; I.V.:447.7] Out: 5951 [Urine:5950; Stool:1]   Intake/Output this shift:  Total I/O In: 480 [P.O.:480] Out: -   Physical Exam: General: NAD, resting in bed  Head: Normocephalic, atraumatic. Moist oral mucosal membranes  Eyes: Anicteric  Lungs:  Clear to auscultation  Heart: Regular rate and rhythm  Abdomen:  Soft, nontender  Extremities:  trace peripheral edema.  Neurologic: Alert, oriented, moving all four extremities  Skin: No lesions       Basic Metabolic Panel: Recent Labs  Lab 08/29/20 0143 08/29/20 0914 08/29/20 1042 08/30/20 0104 08/31/20 0516 09/01/20 0442 09/01/20 1424 09/01/20 2128 09/02/20 0408 09/02/20 1336  NA 122* 124* 123* 123* 123* 122* 122* 125* 126* 127*  K 2.4* 3.2* 3.3* 3.4* 4.2 4.2  --   --   --  3.7  CL 78* 81* 80* 83* 86* 85*  --   --   --  89*  CO2 31 31 30 31 29 27   --   --   --  30  GLUCOSE 200* 202* 217* 209* 184* 204*  --   --   --  225*  BUN 16  15 15 21  34* 45*  --   --   --  27*  CREATININE 0.41* 0.49* 0.48* 0.62 1.12 1.40*  --   --   --  0.78  CALCIUM 8.5* 8.3* 8.3* 8.1* 8.0* 8.0*  --   --   --  8.0*  MG 1.7  --   --  1.9  --   --   --   --   --   --   PHOS  --  2.4*  --  3.8  --   --   --   --   --   --     Liver Function Tests: Recent Labs  Lab 08/29/20 0143  AST 22  ALT 14  ALKPHOS 64  BILITOT 1.4*  PROT 6.3*  ALBUMIN 3.0*   No results for input(s): LIPASE, AMYLASE in the last 168 hours. No results for input(s): AMMONIA in the last 168 hours.  CBC: Recent Labs  Lab 08/29/20 0143 08/29/20 0914 08/29/20 1843 08/30/20 0104 08/31/20 0516 09/01/20 0442  WBC 10.5 10.5  --  11.2* 14.4* 12.0*  NEUTROABS 8.0*  --   --  9.2* 12.0* 9.7*  HGB 10.9* 11.0* 12.2* 11.0* 11.0* 10.8*  HCT 31.9* 31.6* 35.5* 32.5* 32.6* 31.6*  MCV 77.6* 77.6*  --  78.7* 79.5*  79.2*  PLT 252 254  --  229 253 270    Cardiac Enzymes: No results for input(s): CKTOTAL, CKMB, CKMBINDEX, TROPONINI in the last 168 hours.  BNP: Invalid input(s): POCBNP  CBG: Recent Labs  Lab 09/01/20 1138 09/01/20 1703 09/01/20 2045 09/02/20 0731 09/02/20 1158  GLUCAP 167* 190* 193* 219* 191*    Microbiology: Results for orders placed or performed during the hospital encounter of 08/29/20  Resp Panel by RT-PCR (Flu A&B, Covid) Nasopharyngeal Swab     Status: None   Collection Time: 08/29/20  4:47 AM   Specimen: Nasopharyngeal Swab; Nasopharyngeal(NP) swabs in vial transport medium  Result Value Ref Range Status   SARS Coronavirus 2 by RT PCR NEGATIVE NEGATIVE Final    Comment: (NOTE) SARS-CoV-2 target nucleic acids are NOT DETECTED.  The SARS-CoV-2 RNA is generally detectable in upper respiratory specimens during the acute phase of infection. The lowest concentration of SARS-CoV-2 viral copies this assay can detect is 138 copies/mL. A negative result does not preclude SARS-Cov-2 infection and should not be used as the sole basis for treatment  or other patient management decisions. A negative result may occur with  improper specimen collection/handling, submission of specimen other than nasopharyngeal swab, presence of viral mutation(s) within the areas targeted by this assay, and inadequate number of viral copies(<138 copies/mL). A negative result must be combined with clinical observations, patient history, and epidemiological information. The expected result is Negative.  Fact Sheet for Patients:  EntrepreneurPulse.com.au  Fact Sheet for Healthcare Providers:  IncredibleEmployment.be  This test is no t yet approved or cleared by the [place] FDA and  has been authorized for detection and/or diagnosis of SARS-CoV-2 by FDA under an Emergency Use Authorization (EUA). This EUA will remain  in effect (meaning this test can be used) for the duration of the COVID-19 declaration under Section 564(b)(1) of the Act, 21 U.S.C.section 360bbb-3(b)(1), unless the authorization is terminated  or revoked sooner.       Influenza A by PCR NEGATIVE NEGATIVE Final   Influenza B by PCR NEGATIVE NEGATIVE Final    Comment: (NOTE) The Xpert Xpress SARS-CoV-2/FLU/RSV plus assay is intended as an aid in the diagnosis of influenza from Nasopharyngeal swab specimens and should not be used as a sole basis for treatment. Nasal washings and aspirates are unacceptable for Xpert Xpress SARS-CoV-2/FLU/RSV testing.  Fact Sheet for Patients: EntrepreneurPulse.com.au  Fact Sheet for Healthcare Providers: IncredibleEmployment.be  This test is not yet approved or cleared by the [place] FDA and has been authorized for detection and/or diagnosis of SARS-CoV-2 by FDA under an Emergency Use Authorization (EUA). This EUA will remain in effect (meaning this test can be used) for the duration of the COVID-19 declaration under Section 564(b)(1) of the Act, 21 U.S.C. section  360bbb-3(b)(1), unless the authorization is terminated or revoked.  Performed at [facility], [address] [place], [place] [address]   Gastrointestinal Panel by PCR , Stool     Status: None   Collection Time: 08/29/20 10:42 AM   Specimen: Stool  Result Value Ref Range Status   Campylobacter species NOT DETECTED NOT DETECTED Final   Plesimonas shigelloides NOT DETECTED NOT DETECTED Final   Salmonella species NOT DETECTED NOT DETECTED Final   Yersinia enterocolitica NOT DETECTED NOT DETECTED Final   Vibrio species NOT DETECTED NOT DETECTED Final   Vibrio cholerae NOT DETECTED NOT DETECTED Final   Enteroaggregative E coli (EAEC) NOT DETECTED NOT DETECTED Final   Enteropathogenic E coli (EPEC)  NOT DETECTED NOT DETECTED Final   Enterotoxigenic E coli (ETEC) NOT DETECTED NOT DETECTED Final   Shiga like toxin producing E coli (STEC) NOT DETECTED NOT DETECTED Final   Shigella/Enteroinvasive E coli (EIEC) NOT DETECTED NOT DETECTED Final   Cryptosporidium NOT DETECTED NOT DETECTED Final   Cyclospora cayetanensis NOT DETECTED NOT DETECTED Final   Entamoeba histolytica NOT DETECTED NOT DETECTED Final   Giardia lamblia NOT DETECTED NOT DETECTED Final   Adenovirus F40/41 NOT DETECTED NOT DETECTED Final   Astrovirus NOT DETECTED NOT DETECTED Final   Norovirus GI/GII NOT DETECTED NOT DETECTED Final   Rotavirus A NOT DETECTED NOT DETECTED Final   Sapovirus (I, II, IV, and V) NOT DETECTED NOT DETECTED Final    Comment: Performed at [facility], [address] [place], [place] [address]    Coagulation Studies: No results for input(s): LABPROT, INR in the last 72 hours.  Urinalysis: Recent Labs    09/01/20 1925  COLORURINE YELLOW*  LABSPEC 1.006  PHURINE 6.0  GLUCOSEU NEGATIVE  HGBUR LARGE*  BILIRUBINUR NEGATIVE  KETONESUR NEGATIVE  PROTEINUR NEGATIVE  NITRITE NEGATIVE  LEUKOCYTESUR LARGE*      Imaging: CT ABDOMEN PELVIS WO CONTRAST  Result Date:  09/01/2020 CLINICAL DATA:  Abdominal pain.  History of colon cancer. EXAM: CT ABDOMEN AND PELVIS WITHOUT CONTRAST TECHNIQUE: Multidetector CT imaging of the abdomen and pelvis was performed following the standard protocol without IV contrast. COMPARISON:  July 12, 2020. FINDINGS: Lower chest: Left lower lobe atelectasis or pneumonia is noted with associated pleural effusion. Small right pleural effusion is noted with adjacent subsegmental atelectasis. Hepatobiliary: No focal liver abnormality is seen. No gallstones, gallbladder wall thickening, or biliary dilatation. Pancreas: Unremarkable. No pancreatic ductal dilatation or surrounding inflammatory changes. Spleen: Normal in size without focal abnormality. Adrenals/Urinary Tract: Adrenal glands appear normal. Mild bilateral hydronephrosis is noted without obstructing calculus or ureteral dilatation. Moderate urinary bladder distention is noted. Stomach/Bowel: The stomach appears normal. There is no small bowel dilatation. 16 x 6 cm mass is seen in the proximal sigmoid colon that is causing at least partial obstruction and consistent with a history of colonic malignancy. Vascular/Lymphatic: Aortic atherosclerosis. No enlarged abdominal or pelvic lymph nodes. Reproductive: Stable moderate prostatic enlargement. Other: There does appear to be subcutaneous edema involving the visualized portion of the proximal left thigh. Musculoskeletal: No acute or significant osseous findings. IMPRESSION: Left lower lobe atelectasis or pneumonia is noted with associated pleural effusion. Small right pleural effusion is noted with adjacent subsegmental atelectasis. Mild bilateral hydronephrosis is noted without obstructing calculus or ureteral dilatation. Moderate urinary bladder distention is noted. 16 x 6 cm mass is seen involving the proximal sigmoid colon which is causing at least partial obstruction and consistent with the history of colonic malignancy. Stable moderate prostatic  enlargement. Subcutaneous edema is seen involving the visualized portion of the proximal left thigh. Aortic Atherosclerosis (ICD10-I70.0). Electronically Signed   By: Marijo Conception M.D.   On: 09/01/2020 15:29   US RENAL  Result Date: 09/01/2020 CLINICAL DATA:  ATN EXAM: RENAL / URINARY TRACT ULTRASOUND COMPLETE COMPARISON:  CT 09/01/2020 FINDINGS: Right Kidney: Renal measurements: 12.7 x 6.7 x 5.6 cm = volume: 252 mL. Moderate right hydronephrosis, similar to prior CT. Normal echotexture. No mass. Left Kidney: Renal measurements: 13.8 x 6.3 x 5.7 cm = volume: 260 mL. 12 mm medial midpole cyst. Mild left hydronephrosis, similar to prior study. Bladder: Layering debris within the bladder.  No bladder wall thickening. Other: None. IMPRESSION:  Bilateral hydronephrosis, right greater than left, similar to prior CT. Layering debris within the bladder. Electronically Signed   By: Rolm Baptise M.D.   On: 09/01/2020 15:24   DG Abd Portable 1V  Result Date: 09/01/2020 CLINICAL DATA:  Rigidity of the abdomen, history of colon cancer with hernia and hypertension, former smoker. EXAM: PORTABLE ABDOMEN - 1 VIEW COMPARISON:  CT abdomen and pelvis from April of 2021. FINDINGS: Elevated LEFT hemidiaphragm as before with signs of potential LEFT lower lobe airspace disease. Tortuous and distended colon in the central abdomen. Colon measuring up to 11 cm. This could represent the cecum in the midline based on previous imaging. Small amount of rectal gas likely present though difficult to determine IMPRESSION: 1. Tortuous and distended colon in the central abdomen up to 12 cm. Findings are concerning for worsening distal colonic obstruction in the setting of colonic mass. CT may be helpful for further assessment. 2. Elevated LEFT hemidiaphragm with signs of potential LEFT lower lobe airspace disease. Electronically Signed   By: Zetta Bills M.D.   On: 09/01/2020 12:20     Medications:   . sodium chloride 75 mL/hr at  09/02/20 0011   . Chlorhexidine Gluconate Cloth  6 each Topical Daily  . enoxaparin (LOVENOX) injection  40 mg Subcutaneous Q24H  . feeding supplement  237 mL Oral TID BM  . insulin aspart  0-5 Units Subcutaneous QHS  . insulin aspart  0-9 Units Subcutaneous TID WC  . multivitamin with minerals  1 tablet Oral Daily  . nystatin   Topical BID  . tamsulosin  0.4 mg Oral Daily   acetaminophen **OR** acetaminophen, cyclobenzaprine, magnesium hydroxide, promethazine, traZODone  Assessment/ Plan:  [name] is a 80 y.o.  male with essential hypertension and colon cancer recently diagnosed , who was admitted to [facility] on 08/29/2020 for generalized weakness.  1. Hyponatremia related to dehydration from diarrhea and poor intake Appears to have chronic hyponatremia with recent outpatient level of 133 Current level 127 Per patient, diarrhea and appetite improving Continue IVF  Encourage oral intake Will monitor    2. Acute Kidney injury due to bilateral hydronephrosis - Creatinine has improved today - Will recommend Urology consult to further eval hydronephrosis  3. Anemia  - recent diagnosis of colon caner Lab Results  Component Value Date   HGB 10.8 (L) 09/01/2020      LOS: 4   5/23/20222:18 PM

## 2020-09-02 NOTE — TOC Progression Note (Signed)
Transition of Care [facility]) - Progression Note    Patient Details  Name: Keith Reynolds MRN: [id number] Date of Birth: December 29, 1940  Transition of Care [facility]) CM/SW [place], RN Phone Number: 09/02/2020, 9:12 AM  Clinical Narrative: Sophronia Simas, [phone] for Insurance Authorization spoke with Herma Ard, who stated Authorization is pending at this time, she will call me when approved.      Expected Discharge Plan: [place] Barriers to Discharge: Continued Medical Work up  Expected Discharge Plan and Services Expected Discharge Plan: [place]   Discharge Planning Services: CM Consult Post Acute Care Choice: [place] Living arrangements for the past 2 months: Single Family Home                 DME Arranged: N/A DME Agency: NA       HH Arranged: NA           Social Determinants of Health (SDOH) Interventions    Readmission Risk Interventions No flowsheet data found.

## 2020-09-02 NOTE — Progress Notes (Signed)
PROGRESS NOTE  SHUAN STATZER  DOB: [birth year]  PCP: Sofie Hartigan, MD JGG:[id number]  DOA: 08/29/2020  LOS: 4 days  Hospital Day: 5   Chief Complaint  Patient presents with  . Weakness  . Leg Pain    Brief narrative: Keith Reynolds is a 80 y.o. male with PMH significant for essential hypertension and recently diagnosed colon cancer, for which he has a pending follow up at [facility]. Patient presented to the ED late last night with complaint of acute onset bilateral lower extremity pain, progressively worsening for last 3 weeks associated with cramps in hands impaired mobility.  He has been having diarrhea for last 3 weeks with generalized weakness.  In the ED, blood pressure was elevated to 186/93 Labs showed sodium level low at 122, potassium low at 2.4, blood glucose level at 200, hemoglobin at 10.9, low compared to 12.1 in March.  WBC 10.5. Ultrasound duplex of both lower extremities were normal.   Patient was given potassium replacement, magnesium replacement Admitted to hospitalist service. See below for details.  Subjective: Patient was seen and examined this morning.   Pleasant elderly male.  Propped up in bed.  More awake today.  Daughter and wife at bedside. Had 4 episodes of soft bowel movement yesterday. Needed Foley catheter placement yesterday because of urinary retention.  Assessment/Plan: Acute persistent diarrhea -Patient presented with diarrhea for 3 weeks. Probably related to cancer. Improved with Imodium as needed.   -GI pathogen panel normal. Adequately hydrated. Encourage oral hydration -Continue to monitor bowel movement.  Acute urinary retention -5/22, patient was noted to have abdominal distention, firmness.   -CT abdomen was obtained which showed an acute urinary retention.  Foley catheter was inserted that drained about 2 L of milky looking urine.  Urinalysis with large number of leukocytes.   -No fever.  WBC count only mildly  elevated.  Not on antibiotics currently. Recent Labs  Lab 08/29/20 0143 08/29/20 0914 08/30/20 0104 08/31/20 0516 09/01/20 0442  WBC 10.5 10.5 11.2* 14.4* 12.0*   Severe hypokalemia Hypomagnesemia Hypophosphatemia -Secondary to persistent diarrhea.  IV and oral replacement given.  Levels normalized with replacement -Repeat labs intermittently. Recent Labs  Lab 08/29/20 0143 08/29/20 0914 08/29/20 1042 08/30/20 0104 08/31/20 0516 09/01/20 0442  K 2.4* 3.2* 3.3* 3.4* 4.2 4.2  MG 1.7  --   --  1.9  --   --   PHOS  --  2.4*  --  3.8  --   --    AKI -Creatinine gradually worsening, 1.4 on last check on 5/22.  Not sure if urine output is currently documented. -Currently on normal saline. -Nephrology consultation called for coexisting hyponatremia. Recent Labs    08/29/20 0143 08/29/20 0914 08/29/20 1042 08/30/20 0104 08/31/20 0516 09/01/20 0442  BUN 16 15 15 21  34* 45*  CREATININE 0.41* 0.49* 0.48* 0.62 1.12 1.40*   Severe hyponatremia -Sodium level low at 122 at presentation.  My initial suspicion was hypovolemic hyponatremia due to poor oral intake and hydration.  However despite adequate hydration and clinical improvement, sodium level has not improved and was static at 123.  Could be SIADH but urine osmolality seems normal at 330.   -Nephrology consultation appreciated.  Work-up in progress.  Sodium level seems improving to 126 today. Recent Labs  Lab 08/29/20 0143 08/29/20 0914 08/29/20 1042 08/30/20 0104 08/31/20 0516 09/01/20 0442 09/01/20 1424 09/01/20 2128 09/02/20 0408  NA 122* 124* 123* 123* 123* 122* 122* 125* 126*  Generalized weakness Muscle cramps -Probably due to electrolyte abnormalities and dehydration.  Currently on Flexeril as needed for muscle spasm. -Encourage ambulation. PT eval appreciated.  SNF recommended  Recent diagnosis of colon cancer -CT abdomen 5/22 redemonstrated a 16 x 6 cm mass is seen involving the proximal sigmoid colo  causing at least partial obstruction and consistent with the history of colonic malignancy. -Noted a plan to follow-up at [facility] Appointment postponed to next week.  Mild chronic microcytic anemia -Hemoglobin level stable.    Dyslipidemia -Statin  Bilateral pedal edema -Intermittent for last several months.  More prominent now.  Albumin level at 3. -Ultrasound duplex lower extremities negative.  Echocardiogram with EF normal at 60 to 14%, grade 1 diastolic dysfunction. -Apply Ace wrap from both legs.  Recommend leg elevation.  Impaired mobility/generalized weakness -PT eval obtained.  SNF recommended.  Mobility: Encourage ambulation Code Status:   Code Status: Full Code  Nutritional status: Body mass index is 32.73 kg/m. Nutrition Problem: Moderate Malnutrition (in the context of chronic illness) Etiology: cancer and cancer related treatments Signs/Symptoms: moderate fat depletion,mild muscle depletion,moderate muscle depletion Diet Order            Diet Carb Modified Fluid consistency: Thin; Room service appropriate? Yes  Diet effective now                 DVT prophylaxis: enoxaparin (LOVENOX) injection 40 mg Start: 08/29/20 0800   Antimicrobials:  None Fluid: Normal saline at 75 mill per hour  Consultants: None Family Communication:  Wife at bedside  Status is: Inpatient  Remains inpatient appropriate because: Continues to have low sodium level.  Work-up continued Dispo: The patient is from: Home              Anticipated d/c is to: SNF in 2 to 3 days              Patient currently is not medically stable to d/c.   Difficult to place patient No     Infusions:  . sodium chloride 75 mL/hr at 09/02/20 0011    Scheduled Meds: . Chlorhexidine Gluconate Cloth  6 each Topical Daily  . enoxaparin (LOVENOX) injection  40 mg Subcutaneous Q24H  . feeding supplement  237 mL Oral TID BM  . insulin aspart  0-5 Units Subcutaneous QHS  . insulin aspart  0-9 Units  Subcutaneous TID WC  . multivitamin with minerals  1 tablet Oral Daily  . nystatin   Topical BID  . tamsulosin  0.4 mg Oral Daily    Antimicrobials: Anti-infectives (From admission, onward)   None      PRN meds: acetaminophen **OR** acetaminophen, cyclobenzaprine, magnesium hydroxide, promethazine, traZODone   Objective: Vitals:   09/02/20 0544 09/02/20 0728  BP: (!) 132/52 (!) 110/51  Pulse: 83 90  Resp: 20 18  Temp: 98.2 F (36.8 C) 97.8 F (36.6 C)  SpO2: 96% 94%    Intake/Output Summary (Last 24 hours) at 09/02/2020 1038 Last data filed at 09/02/2020 1029 Gross per 24 hour  Intake 927.71 ml  Output 5600 ml  Net -4672.29 ml   Filed Weights   08/29/20 0125 08/30/20 1041  Weight: 63.5 kg 94.8 kg   Weight change:  Body mass index is 32.73 kg/m.   Physical Exam: General exam: Pleasant, elderly Caucasian male.  Not in physical distress.  No muscle cramp.  Skin: No rashes, lesions or ulcers. HEENT: Atraumatic, normocephalic, no obvious bleeding Lungs: Clear to auscultation bilaterally CVS: Regular rate and rhythm,  no murmur GI/Abd improving abdominal distention and tenderness.   CNS: More awake today, oriented to place and person.  Slow to respond.  Hard of hearing at baseline. Psychiatry: Mood appropriate Extremities: Trace bilateral pedal edema, no calf tenderness  Data Review: I have personally reviewed the laboratory data and studies available.  Recent Labs  Lab 08/29/20 0143 08/29/20 0914 08/29/20 1843 08/30/20 0104 08/31/20 0516 09/01/20 0442  WBC 10.5 10.5  --  11.2* 14.4* 12.0*  NEUTROABS 8.0*  --   --  9.2* 12.0* 9.7*  HGB 10.9* 11.0* 12.2* 11.0* 11.0* 10.8*  HCT 31.9* 31.6* 35.5* 32.5* 32.6* 31.6*  MCV 77.6* 77.6*  --  78.7* 79.5* 79.2*  PLT 252 254  --  229 253 270   Recent Labs  Lab 08/29/20 0143 08/29/20 0914 08/29/20 1042 08/30/20 0104 08/31/20 0516 09/01/20 0442 09/01/20 1424 09/01/20 2128 09/02/20 0408  NA 122* 124* 123* 123*  123* 122* 122* 125* 126*  K 2.4* 3.2* 3.3* 3.4* 4.2 4.2  --   --   --   CL 78* 81* 80* 83* 86* 85*  --   --   --   CO2 31 31 30 31 29 27   --   --   --   GLUCOSE 200* 202* 217* 209* 184* 204*  --   --   --   BUN 16 15 15 21  34* 45*  --   --   --   CREATININE 0.41* 0.49* 0.48* 0.62 1.12 1.40*  --   --   --   CALCIUM 8.5* 8.3* 8.3* 8.1* 8.0* 8.0*  --   --   --   MG 1.7  --   --  1.9  --   --   --   --   --   PHOS  --  2.4*  --  3.8  --   --   --   --   --     F/u labs ordered Unresulted Labs (From admission, onward)          Start     Ordered   09/03/20 0500  CBC with Differential/Platelet  Daily,   R     Question:  Specimen collection method  Answer:  Lab=Lab collect   09/02/20 0753   09/03/20 [id number]  Basic metabolic panel  Daily,   R     Question:  Specimen collection method  Answer:  Lab=Lab collect   09/02/20 0753   09/03/20 0500  Magnesium  Tomorrow morning,   R       Question:  Specimen collection method  Answer:  Lab=Lab collect   09/02/20 0753   09/03/20 0500  Phosphorus  Tomorrow morning,   R       Question:  Specimen collection method  Answer:  Lab=Lab collect   09/02/20 0753   09/02/20 [id number]  Basic metabolic panel  Add-on,   AD       Question:  Specimen collection method  Answer:  Lab=Lab collect   09/02/20 1037   09/02/20 0842  Sodium, urine, random  Once,   R        09/02/20 0842   09/01/20 1337  Sodium  Now then every 8 hours,   TIMED     Question:  Specimen collection method  Answer:  Lab=Lab collect   09/01/20 1336   Unscheduled  Occult blood card to lab, stool  As needed,   STAT      08/29/20 0543  Signed, Terrilee Croak, MD [facility] 09/02/2020

## 2020-09-02 NOTE — Progress Notes (Signed)
Physical Therapy Treatment Patient Details Name: Keith Reynolds MRN: [id number] DOB: March 07, 1941 Today's Date: 09/02/2020    History of Present Illness presented to ER secondary to progressive weakness to bilat LEs, with fall prior to admission; admitted for management of severe hypokalemia, anemia and persistent diarrhea.  Of note, scheduled to start treatment for newly-diagnosed colon CA on Monday, 09/02/20.    PT Comments    Continues to endorse pain in L LE with all movement and WBing activities; requiring increased time/effort for transitions throughout session.  Able to initiate short-distance, forward-progressive gait, but requires RW and +2 for optimal safety.  Distance limited by RW. Unable to tolerate distance adequate for household mobilization; currently requiring level of physical assist that wife cannot provide.  Continue to recommend STR upon discharge from acute hospitalization to facilitate optimal return to PLOF.     Follow Up Recommendations  SNF     Equipment Recommendations       Recommendations for Other Services       Precautions / Restrictions Precautions Precautions: Fall Restrictions Weight Bearing Restrictions: No    Mobility  Bed Mobility Overal bed mobility: Needs Assistance Bed Mobility: Supine to Sit     Supine to sit: Mod assist;Max assist;+2 for physical assistance          Transfers Overall transfer level: Needs assistance Equipment used: Rolling walker (2 wheeled) Transfers: Sit to/from Stand Sit to Stand: Mod assist;+2 physical assistance         General transfer comment: very slow and effortful; step by step cuing for sequencing  Requires UE support and extensive assist for lift off  Ambulation/Gait Ambulation/Gait assistance: Min assist;+2 physical assistance Gait Distance (Feet): 5 Feet Assistive device: Rolling walker (2 wheeled)       General Gait Details: able to initiate forward gait progression, +2 for optimal  safety. Demonstrates 3-point, step to gait pattern with decrease stance time/WBing L LE (due to pain).  Distance limited as result.   Stairs             Wheelchair Mobility    Modified Rankin (Stroke Patients Only)       Balance Overall balance assessment: Needs assistance Sitting-balance support: No upper extremity supported;Feet supported Sitting balance-Leahy Scale: Fair Sitting balance - Comments: min assist for initial sitting balance, close sup once accommodates to position   Standing balance support: Bilateral upper extremity supported Standing balance-Leahy Scale: Fair                              Cognition Arousal/Alertness: Awake/alert Behavior During Therapy: WFL for tasks assessed/performed Overall Cognitive Status: Within Functional Limits for tasks assessed                                 General Comments: wants to try to do as much for himself as able      Exercises Other Exercises Other Exercises: Rolling bilat, min/mod assist; cuing for hook-lying position of contralateral UE, heavy use of bedrails to initiate rotation.  Dep of second person for hygiene after bowel incontinence. Other Exercises: Sit/stand from edge of bed, recliner surfaces, mod assist +2 for lift off.  Incresaed time/effort required to complete; patient very slow and guarded in all movement patterns. Other Exercises: Bed/chair with RW, min/mod assist +2-hand-over-hand assist for management of RW at all times    General Comments  Pertinent Vitals/Pain Pain Assessment: 0-10 Pain Score: 6  Pain Location: L LE Pain Descriptors / Indicators: Aching;Grimacing Pain Intervention(s): Monitored during session;Repositioned;Limited activity within patient's tolerance    Home Living                      Prior Function            PT Goals (current goals can now be found in the care plan section) Acute Rehab PT Goals Patient Stated Goal: to  return home, to start treatment Monday PT Goal Formulation: With patient/family Time For Goal Achievement: 09/12/20 Potential to Achieve Goals: Fair Progress towards PT goals: Progressing toward goals    Frequency    Min 2X/week      PT Plan Current plan remains appropriate    Co-evaluation              AM-PAC PT "6 Clicks" Mobility   Outcome Measure  Help needed turning from your back to your side while in a flat bed without using bedrails?: A Lot Help needed moving from lying on your back to sitting on the side of a flat bed without using bedrails?: A Lot Help needed moving to and from a bed to a chair (including a wheelchair)?: A Lot Help needed standing up from a chair using your arms (e.g., wheelchair or bedside chair)?: A Lot Help needed to walk in hospital room?: A Lot Help needed climbing 3-5 steps with a railing? : A Lot 6 Click Score: 12    End of Session Equipment Utilized During Treatment: Gait belt Activity Tolerance: Patient tolerated treatment well;Patient limited by pain Patient left: in chair;with call bell/phone within reach;with chair alarm set Nurse Communication: Mobility status PT Visit Diagnosis: Muscle weakness (generalized) (M62.81);Repeated falls (R29.6);Difficulty in walking, not elsewhere classified (R26.2)     Time: 6578-4696 PT Time Calculation (min) (ACUTE ONLY): 40 min  Charges:  $Gait Training: 8-22 mins $Therapeutic Activity: 23-37 mins                    Marium Ragan H. Owens Shark, PT, DPT, NCS 09/02/20, 11:35 AM [phone]

## 2020-09-03 DIAGNOSIS — E876 Hypokalemia: Secondary | ICD-10-CM | POA: Diagnosis not present

## 2020-09-03 LAB — CBC WITH DIFFERENTIAL/PLATELET
Abs Immature Granulocytes: 0.05 10*3/uL (ref 0.00–0.07)
Basophils Absolute: 0 10*3/uL (ref 0.0–0.1)
Basophils Relative: 0 %
Eosinophils Absolute: 0 10*3/uL (ref 0.0–0.5)
Eosinophils Relative: 1 %
HCT: 27 % — ABNORMAL LOW (ref 39.0–52.0)
Hemoglobin: 9.2 g/dL — ABNORMAL LOW (ref 13.0–17.0)
Immature Granulocytes: 1 %
Lymphocytes Relative: 12 %
Lymphs Abs: 0.8 10*3/uL (ref 0.7–4.0)
MCH: 27.5 pg (ref 26.0–34.0)
MCHC: 34.1 g/dL (ref 30.0–36.0)
MCV: 80.6 fL (ref 80.0–100.0)
Monocytes Absolute: 0.7 10*3/uL (ref 0.1–1.0)
Monocytes Relative: 10 %
Neutro Abs: 5.1 10*3/uL (ref 1.7–7.7)
Neutrophils Relative %: 76 %
Platelets: 216 10*3/uL (ref 150–400)
RBC: 3.35 MIL/uL — ABNORMAL LOW (ref 4.22–5.81)
RDW: 17.2 % — ABNORMAL HIGH (ref 11.5–15.5)
WBC: 6.8 10*3/uL (ref 4.0–10.5)
nRBC: 0 % (ref 0.0–0.2)

## 2020-09-03 LAB — MAGNESIUM: Magnesium: 1.8 mg/dL (ref 1.7–2.4)

## 2020-09-03 LAB — GLUCOSE, CAPILLARY
Glucose-Capillary: 201 mg/dL — ABNORMAL HIGH (ref 70–99)
Glucose-Capillary: 192 mg/dL — ABNORMAL HIGH (ref 70–99)
Glucose-Capillary: 162 mg/dL — ABNORMAL HIGH (ref 70–99)
Glucose-Capillary: 193 mg/dL — ABNORMAL HIGH (ref 70–99)

## 2020-09-03 LAB — SODIUM
Sodium: 128 mmol/L — ABNORMAL LOW (ref 135–145)
Sodium: 130 mmol/L — ABNORMAL LOW (ref 135–145)

## 2020-09-03 LAB — BASIC METABOLIC PANEL
Anion gap: 8 (ref 5–15)
BUN: 26 mg/dL — ABNORMAL HIGH (ref 8–23)
CO2: 28 mmol/L (ref 22–32)
Calcium: 7.6 mg/dL — ABNORMAL LOW (ref 8.9–10.3)
Chloride: 93 mmol/L — ABNORMAL LOW (ref 98–111)
Creatinine, Ser: 0.75 mg/dL (ref 0.61–1.24)
GFR, Estimated: >60 mL/min (ref >60)
Glucose, Bld: 173 mg/dL — ABNORMAL HIGH (ref 70–99)
Potassium: 3.7 mmol/L (ref 3.5–5.1)
Sodium: 129 mmol/L — ABNORMAL LOW (ref 135–145)

## 2020-09-03 LAB — PHOSPHORUS: Phosphorus: 2.9 mg/dL (ref 2.5–4.6)

## 2020-09-03 MED ORDER — LOPERAMIDE HCL 2 MG PO CAPS
2.0000 mg | ORAL_CAPSULE | Freq: Four times a day (QID) | ORAL | Status: DC | PRN
  Administered 2020-09-03 – 2020-09-04 (×2): 2 mg via ORAL
  Filled 2020-09-03 (×2): qty 1

## 2020-09-03 NOTE — Progress Notes (Signed)
[facility]  ROUNDING NOTE   Subjective:  [name] is a 80 y.o.  male with essential hypertension and colon cancer recently diagnosed , who was admitted to [facility] on 08/29/2020 for generalized weakness.  Patient laying in bed Alert and oriented Improved appetite Decreased diarrhea Ambulated with therapy yesterday, tolerated well Admits to weakness  Objective:  Vital signs in last 24 hours:  Temp:  [97.9 F (36.6 C)-100.8 F (38.2 C)] 99.3 F (37.4 C) (05/24 0807) Pulse Rate:  [60-99] 97 (05/24 0807) Resp:  [15-20] 15 (05/24 0807) BP: (104-124)/(48-62) 110/48 (05/24 0807) SpO2:  [93 %-99 %] 93 % (05/24 0807)  Weight change:  Filed Weights   08/29/20 0125 08/30/20 1041  Weight: 63.5 kg 94.8 kg    Intake/Output: I/O last 3 completed shifts: In: 720 [P.O.:720] Out: 4350 [Urine:4350]   Intake/Output this shift:  No intake/output data recorded.  Physical Exam: General: NAD, resting in bed  Head: Normocephalic, atraumatic. Moist oral mucosal membranes  Eyes: Anicteric  Lungs:  Clear to auscultation  Heart: Regular rate and rhythm  Abdomen:  Soft, nontender  Extremities:  1+ peripheral edema.  Neurologic: Alert, oriented, moving all four extremities  Skin: No lesions  GU Foley    Basic Metabolic Panel: Recent Labs  Lab 08/29/20 0143 08/29/20 0914 08/29/20 1042 08/30/20 0104 08/31/20 0516 09/01/20 0442 09/01/20 1424 09/01/20 2128 09/02/20 0408 09/02/20 1336 09/02/20 2106 09/03/20 0545  NA 122* 124*   < > 123* 123* 122*   < > 125* 126* 127* 129* 129*  K 2.4* 3.2*   < > 3.4* 4.2 4.2  --   --   --  3.7  --  3.7  CL 78* 81*   < > 83* 86* 85*  --   --   --  89*  --  93*  CO2 31 31   < > 31 29 27   --   --   --  30  --  28  GLUCOSE 200* 202*   < > 209* 184* 204*  --   --   --  225*  --  173*  BUN 16 15   < > 21 34* 45*  --   --   --  27*  --  26*  CREATININE 0.41* 0.49*   < > 0.62 1.12 1.40*  --   --   --  0.78  --  0.75  CALCIUM 8.5*  8.3*   < > 8.1* 8.0* 8.0*  --   --   --  8.0*  --  7.6*  MG 1.7  --   --  1.9  --   --   --   --   --   --   --  1.8  PHOS  --  2.4*  --  3.8  --   --   --   --   --   --   --  2.9   < > = values in this interval not displayed.    Liver Function Tests: Recent Labs  Lab 08/29/20 0143  AST 22  ALT 14  ALKPHOS 64  BILITOT 1.4*  PROT 6.3*  ALBUMIN 3.0*   No results for input(s): LIPASE, AMYLASE in the last 168 hours. No results for input(s): AMMONIA in the last 168 hours.  CBC: Recent Labs  Lab 08/29/20 0143 08/29/20 0914 08/29/20 1843 08/30/20 0104 08/31/20 0516 09/01/20 0442 09/03/20 0545  WBC 10.5 10.5  --  11.2* 14.4* 12.0*  6.8  NEUTROABS 8.0*  --   --  9.2* 12.0* 9.7* 5.1  HGB 10.9* 11.0* 12.2* 11.0* 11.0* 10.8* 9.2*  HCT 31.9* 31.6* 35.5* 32.5* 32.6* 31.6* 27.0*  MCV 77.6* 77.6*  --  78.7* 79.5* 79.2* 80.6  PLT 252 254  --  229 253 270 216    Cardiac Enzymes: No results for input(s): CKTOTAL, CKMB, CKMBINDEX, TROPONINI in the last 168 hours.  BNP: Invalid input(s): POCBNP  CBG: Recent Labs  Lab 09/02/20 0731 09/02/20 1158 09/02/20 1535 09/02/20 2104 09/03/20 0805  GLUCAP 219* 191* 181* 149* 201*    Microbiology: Results for orders placed or performed during the hospital encounter of 08/29/20  Resp Panel by RT-PCR (Flu A&B, Covid) Nasopharyngeal Swab     Status: None   Collection Time: 08/29/20  4:47 AM   Specimen: Nasopharyngeal Swab; Nasopharyngeal(NP) swabs in vial transport medium  Result Value Ref Range Status   SARS Coronavirus 2 by RT PCR NEGATIVE NEGATIVE Final    Comment: (NOTE) SARS-CoV-2 target nucleic acids are NOT DETECTED.  The SARS-CoV-2 RNA is generally detectable in upper respiratory specimens during the acute phase of infection. The lowest concentration of SARS-CoV-2 viral copies this assay can detect is 138 copies/mL. A negative result does not preclude SARS-Cov-2 infection and should not be used as the sole basis for treatment  or other patient management decisions. A negative result may occur with  improper specimen collection/handling, submission of specimen other than nasopharyngeal swab, presence of viral mutation(s) within the areas targeted by this assay, and inadequate number of viral copies(<138 copies/mL). A negative result must be combined with clinical observations, patient history, and epidemiological information. The expected result is Negative.  Fact Sheet for Patients:  EntrepreneurPulse.com.au  Fact Sheet for Healthcare Providers:  IncredibleEmployment.be  This test is no t yet approved or cleared by the [place] FDA and  has been authorized for detection and/or diagnosis of SARS-CoV-2 by FDA under an Emergency Use Authorization (EUA). This EUA will remain  in effect (meaning this test can be used) for the duration of the COVID-19 declaration under Section 564(b)(1) of the Act, 21 U.S.C.section 360bbb-3(b)(1), unless the authorization is terminated  or revoked sooner.       Influenza A by PCR NEGATIVE NEGATIVE Final   Influenza B by PCR NEGATIVE NEGATIVE Final    Comment: (NOTE) The Xpert Xpress SARS-CoV-2/FLU/RSV plus assay is intended as an aid in the diagnosis of influenza from Nasopharyngeal swab specimens and should not be used as a sole basis for treatment. Nasal washings and aspirates are unacceptable for Xpert Xpress SARS-CoV-2/FLU/RSV testing.  Fact Sheet for Patients: EntrepreneurPulse.com.au  Fact Sheet for Healthcare Providers: IncredibleEmployment.be  This test is not yet approved or cleared by the [place] FDA and has been authorized for detection and/or diagnosis of SARS-CoV-2 by FDA under an Emergency Use Authorization (EUA). This EUA will remain in effect (meaning this test can be used) for the duration of the COVID-19 declaration under Section 564(b)(1) of the Act, 21 U.S.C. section  360bbb-3(b)(1), unless the authorization is terminated or revoked.  Performed at [facility], [address] [place], [place] [address]   Gastrointestinal Panel by PCR , Stool     Status: None   Collection Time: 08/29/20 10:42 AM   Specimen: Stool  Result Value Ref Range Status   Campylobacter species NOT DETECTED NOT DETECTED Final   Plesimonas shigelloides NOT DETECTED NOT DETECTED Final   Salmonella species NOT DETECTED NOT DETECTED Final  Yersinia enterocolitica NOT DETECTED NOT DETECTED Final   Vibrio species NOT DETECTED NOT DETECTED Final   Vibrio cholerae NOT DETECTED NOT DETECTED Final   Enteroaggregative E coli (EAEC) NOT DETECTED NOT DETECTED Final   Enteropathogenic E coli (EPEC) NOT DETECTED NOT DETECTED Final   Enterotoxigenic E coli (ETEC) NOT DETECTED NOT DETECTED Final   Shiga like toxin producing E coli (STEC) NOT DETECTED NOT DETECTED Final   Shigella/Enteroinvasive E coli (EIEC) NOT DETECTED NOT DETECTED Final   Cryptosporidium NOT DETECTED NOT DETECTED Final   Cyclospora cayetanensis NOT DETECTED NOT DETECTED Final   Entamoeba histolytica NOT DETECTED NOT DETECTED Final   Giardia lamblia NOT DETECTED NOT DETECTED Final   Adenovirus F40/41 NOT DETECTED NOT DETECTED Final   Astrovirus NOT DETECTED NOT DETECTED Final   Norovirus GI/GII NOT DETECTED NOT DETECTED Final   Rotavirus A NOT DETECTED NOT DETECTED Final   Sapovirus (I, II, IV, and V) NOT DETECTED NOT DETECTED Final    Comment: Performed at [facility], [address] [place], [place] [address]    Coagulation Studies: No results for input(s): LABPROT, INR in the last 72 hours.  Urinalysis: Recent Labs    09/01/20 1925  COLORURINE YELLOW*  LABSPEC 1.006  PHURINE 6.0  GLUCOSEU NEGATIVE  HGBUR LARGE*  BILIRUBINUR NEGATIVE  KETONESUR NEGATIVE  PROTEINUR NEGATIVE  NITRITE NEGATIVE  LEUKOCYTESUR LARGE*      Imaging: CT ABDOMEN PELVIS WO CONTRAST  Result Date:  09/01/2020 CLINICAL DATA:  Abdominal pain.  History of colon cancer. EXAM: CT ABDOMEN AND PELVIS WITHOUT CONTRAST TECHNIQUE: Multidetector CT imaging of the abdomen and pelvis was performed following the standard protocol without IV contrast. COMPARISON:  July 12, 2020. FINDINGS: Lower chest: Left lower lobe atelectasis or pneumonia is noted with associated pleural effusion. Small right pleural effusion is noted with adjacent subsegmental atelectasis. Hepatobiliary: No focal liver abnormality is seen. No gallstones, gallbladder wall thickening, or biliary dilatation. Pancreas: Unremarkable. No pancreatic ductal dilatation or surrounding inflammatory changes. Spleen: Normal in size without focal abnormality. Adrenals/Urinary Tract: Adrenal glands appear normal. Mild bilateral hydronephrosis is noted without obstructing calculus or ureteral dilatation. Moderate urinary bladder distention is noted. Stomach/Bowel: The stomach appears normal. There is no small bowel dilatation. 16 x 6 cm mass is seen in the proximal sigmoid colon that is causing at least partial obstruction and consistent with a history of colonic malignancy. Vascular/Lymphatic: Aortic atherosclerosis. No enlarged abdominal or pelvic lymph nodes. Reproductive: Stable moderate prostatic enlargement. Other: There does appear to be subcutaneous edema involving the visualized portion of the proximal left thigh. Musculoskeletal: No acute or significant osseous findings. IMPRESSION: Left lower lobe atelectasis or pneumonia is noted with associated pleural effusion. Small right pleural effusion is noted with adjacent subsegmental atelectasis. Mild bilateral hydronephrosis is noted without obstructing calculus or ureteral dilatation. Moderate urinary bladder distention is noted. 16 x 6 cm mass is seen involving the proximal sigmoid colon which is causing at least partial obstruction and consistent with the history of colonic malignancy. Stable moderate prostatic  enlargement. Subcutaneous edema is seen involving the visualized portion of the proximal left thigh. Aortic Atherosclerosis (ICD10-I70.0). Electronically Signed   By: Marijo Conception M.D.   On: 09/01/2020 15:29   US RENAL  Result Date: 09/01/2020 CLINICAL DATA:  ATN EXAM: RENAL / URINARY TRACT ULTRASOUND COMPLETE COMPARISON:  CT 09/01/2020 FINDINGS: Right Kidney: Renal measurements: 12.7 x 6.7 x 5.6 cm = volume: 252 mL. Moderate right hydronephrosis, similar to prior CT. Normal echotexture.  No mass. Left Kidney: Renal measurements: 13.8 x 6.3 x 5.7 cm = volume: 260 mL. 12 mm medial midpole cyst. Mild left hydronephrosis, similar to prior study. Bladder: Layering debris within the bladder.  No bladder wall thickening. Other: None. IMPRESSION: Bilateral hydronephrosis, right greater than left, similar to prior CT. Layering debris within the bladder. Electronically Signed   By: Rolm Baptise M.D.   On: 09/01/2020 15:24   DG Abd Portable 1V  Result Date: 09/01/2020 CLINICAL DATA:  Rigidity of the abdomen, history of colon cancer with hernia and hypertension, former smoker. EXAM: PORTABLE ABDOMEN - 1 VIEW COMPARISON:  CT abdomen and pelvis from April of 2021. FINDINGS: Elevated LEFT hemidiaphragm as before with signs of potential LEFT lower lobe airspace disease. Tortuous and distended colon in the central abdomen. Colon measuring up to 11 cm. This could represent the cecum in the midline based on previous imaging. Small amount of rectal gas likely present though difficult to determine IMPRESSION: 1. Tortuous and distended colon in the central abdomen up to 12 cm. Findings are concerning for worsening distal colonic obstruction in the setting of colonic mass. CT may be helpful for further assessment. 2. Elevated LEFT hemidiaphragm with signs of potential LEFT lower lobe airspace disease. Electronically Signed   By: Zetta Bills M.D.   On: 09/01/2020 12:20     Medications:   . sodium chloride 75 mL/hr at  09/02/20 0011   . Chlorhexidine Gluconate Cloth  6 each Topical Daily  . enoxaparin (LOVENOX) injection  40 mg Subcutaneous Q24H  . feeding supplement  237 mL Oral TID BM  . insulin aspart  0-5 Units Subcutaneous QHS  . insulin aspart  0-9 Units Subcutaneous TID WC  . multivitamin with minerals  1 tablet Oral Daily  . nystatin   Topical BID  . tamsulosin  0.4 mg Oral Daily   acetaminophen **OR** acetaminophen, cyclobenzaprine, magnesium hydroxide, promethazine, traZODone  Assessment/ Plan:  [name] is a 80 y.o.  male with essential hypertension and colon cancer recently diagnosed , who was admitted to [facility] on 08/29/2020 for generalized weakness.  1. Hyponatremia related to dehydration from diarrhea and poor intake Appears to have chronic hyponatremia with recent outpatient level of 133 May have some SIADH on top of dehydration, will monitor this Improved level to 129 Diarrhea and appetite improving Continue IVF for now If appetite continues to improve, may consider stopping IVF tomorow     2. Acute Kidney injury due to bilateral hydronephrosis - Creatinine has improved today - Contacted Urology to eval hydronephrosis seen on CT scan   3. Anemia  - recent diagnosis of colon caner Lab Results  Component Value Date   HGB 10.8 (L) 09/01/2020      LOS: 5   5/24/20228:57 AM

## 2020-09-03 NOTE — Progress Notes (Signed)
PROGRESS NOTE  Keith Reynolds  DOB: 11/13/1940  PCP: Sofie Hartigan, MD PJA:[id number]  DOA: 08/29/2020  LOS: 5 days  Hospital Day: 6   Chief Complaint  Patient presents with  . Weakness  . Leg Pain    Brief narrative: Keith Reynolds is a 80 y.o. male with PMH significant for essential hypertension and recently diagnosed colon cancer, for which he has a pending follow up at [facility]. Patient presented to the ED late last night with complaint of acute onset bilateral lower extremity pain, progressively worsening for last 3 weeks associated with cramps in hands impaired mobility.  He has been having diarrhea for last 3 weeks with generalized weakness.  In the ED, blood pressure was elevated to 186/93 Labs showed sodium level low at 122, potassium low at 2.4, blood glucose level at 200, hemoglobin at 10.9, low compared to 12.1 in March.  WBC 10.5. Ultrasound duplex of both lower extremities were normal.   Patient was given potassium replacement, magnesium replacement Admitted to hospitalist service. See below for details.  Subjective: Patient was seen and examined this morning.   Pleasant elderly male.   Lying in bed.  Not in distress.  3-4 episode of watery/soft bowel movement in last 24 hours.   Sodium level gradually improving.   Foley cath with clear urine.    Assessment/Plan: Acute persistent diarrhea -Patient presented with diarrhea for 3 weeks. Probably related to cancer.  Improved with Imodium.  Continue TPN. -GI pathogen panel normal. Adequately hydrated. Encourage oral hydration -Continue to monitor bowel movement.  Acute urinary retention -5/22, patient was noted to have abdominal distention, firmness.   -CT abdomen was obtained which showed an acute urinary retention. Foley catheter was inserted that drained about 2 L of milky looking urine.  Urinalysis with large number of leukocytes.   -No fever.  WBC count improved without  antibiotics. -5/20, I discussed with urologist Dr. Diamantina Providence.  He reviewed the scan.  Patient has a known colonic mass which is not compressing on the ureters.  Imaging did not suggest any urothelial tumors or stones.  Bilateral hydronephrosis is probably secondary to bladder outlet obstruction which is now relieved.  Dilated ureters probably will take some time to look normal.  Flomax has been started.  Dr. Diamantina Providence to arrange voiding trial as an outpatient in 1 to 2 weeks. Recent Labs  Lab 08/29/20 0914 08/30/20 0104 08/31/20 0516 09/01/20 0442 09/03/20 0545  WBC 10.5 11.2* 14.4* 12.0* 6.8   Severe hypokalemia Hypomagnesemia Hypophosphatemia -Secondary to persistent diarrhea.  IV and oral replacement given.  Levels normalized with replacement -Repeat labs intermittently. Recent Labs  Lab 08/29/20 0143 08/29/20 0914 08/29/20 1042 08/30/20 0104 08/31/20 0516 09/01/20 0442 09/02/20 1336 09/03/20 0545  K 2.4* 3.2*   < > 3.4* 4.2 4.2 3.7 3.7  MG 1.7  --   --  1.9  --   --   --  1.8  PHOS  --  2.4*  --  3.8  --   --   --  2.9   < > = values in this interval not displayed.   AKI -Creatinine slightly worsened to peak at 1.4.  Not improving with IV fluid. Recent Labs    08/29/20 0143 08/29/20 0914 08/29/20 1042 08/30/20 0104 08/31/20 0516 09/01/20 0442 09/02/20 1336 09/03/20 0545  BUN 16 15 15 21  34* 45* 27* 26*  CREATININE 0.41* 0.49* 0.48* 0.62 1.12 1.40* 0.78 0.75   Severe hyponatremia -Sodium level low  at 122 at presentation.  My initial suspicion was hypovolemic hyponatremia due to poor oral intake and hydration.  However despite adequate hydration and clinical improvement, sodium level did not improve and was static at 123.  Urine osmolality slightly elevated at 330.  Patient probably had hyponatremia related to dehydration from diarrhea and poor oral intake.  May also have some ascites on top of dehydration.   -Nephrology consultation appreciated.  Work-up in progress.   Sodium level seems improving to 129 today. Recent Labs  Lab 08/29/20 1042 08/30/20 0104 08/31/20 0516 09/01/20 0442 09/01/20 1424 09/01/20 2128 09/02/20 0408 09/02/20 1336 09/02/20 2106 09/03/20 0545  NA 123* 123* 123* 122* 122* 125* 126* 127* 129* 129*   Generalized weakness Muscle cramps -Probably due to electrolyte abnormalities and dehydration.  Currently on Flexeril as needed for muscle spasm. -Encourage ambulation. PT eval appreciated.  SNF recommended  Recent diagnosis of colon cancer -CT abdomen 5/22 redemonstrated a 16 x 6 cm mass is seen involving the proximal sigmoid colo causing at least partial obstruction and consistent with the history of colonic malignancy. -Patient had an outpatient visit scheduled with surgeon at [facility] this week.  However because of hospitalization, his visit has been postponed to next week.  Mild chronic microcytic anemia -Hemoglobin level stable.    Dyslipidemia -Statin  Bilateral pedal edema -Intermittent for last several months.  More prominent now.  Albumin level at 3. -Ultrasound duplex lower extremities negative.  Echocardiogram with EF normal at 60 to 65%, grade 1 diastolic dysfunction. -Apply Ace wrap from both legs.  Recommend leg elevation.  Impaired mobility/generalized weakness -PT eval obtained.  SNF recommended.  Mobility: Encourage ambulation Code Status:   Code Status: Full Code  Nutritional status: Body mass index is 32.73 kg/m. Nutrition Problem: Moderate Malnutrition (in the context of chronic illness) Etiology: cancer and cancer related treatments Signs/Symptoms: moderate fat depletion,mild muscle depletion,moderate muscle depletion Diet Order            Diet Carb Modified Fluid consistency: Thin; Room service appropriate? Yes  Diet effective now                 DVT prophylaxis: enoxaparin (LOVENOX) injection 40 mg Start: 08/29/20 0800   Antimicrobials:  None Fluid: Normal saline at 75 mill per hour   Consultants: None Family Communication:  Wife at bedside  Status is: Inpatient  Remains inpatient appropriate because: Continues to have low sodium level.  Work-up continued Dispo: The patient is from: Home              Anticipated d/c is to: SNF in 2 to 3 days              Patient currently is not medically stable to d/c.   Difficult to place patient No     Infusions:  . sodium chloride 75 mL/hr at 09/02/20 0011    Scheduled Meds: . Chlorhexidine Gluconate Cloth  6 each Topical Daily  . enoxaparin (LOVENOX) injection  40 mg Subcutaneous Q24H  . feeding supplement  237 mL Oral TID BM  . insulin aspart  0-5 Units Subcutaneous QHS  . insulin aspart  0-9 Units Subcutaneous TID WC  . multivitamin with minerals  1 tablet Oral Daily  . nystatin   Topical BID  . tamsulosin  0.4 mg Oral Daily    Antimicrobials: Anti-infectives (From admission, onward)   None      PRN meds: acetaminophen **OR** acetaminophen, cyclobenzaprine, loperamide, magnesium hydroxide, promethazine, traZODone   Objective: Vitals:  09/03/20 0525 09/03/20 0807  BP: (!) 104/55 (!) 110/48  Pulse: 93 97  Resp:  15  Temp: 99.5 F (37.5 C) 99.3 F (37.4 C)  SpO2: 93% 93%    Intake/Output Summary (Last 24 hours) at 09/03/2020 1039 Last data filed at 09/03/2020 1023 Gross per 24 hour  Intake 720 ml  Output 1250 ml  Net -530 ml   Filed Weights   08/29/20 0125 08/30/20 1041  Weight: 63.5 kg 94.8 kg   Weight change:  Body mass index is 32.73 kg/m.   Physical Exam: General exam: Pleasant, elderly Caucasian male.  Not in physical distress.  No muscle cramp.   Skin: No rashes, lesions or ulcers. HEENT: Atraumatic, normocephalic, no obvious bleeding Lungs: Clear to auscultation bilaterally CVS: Regular rate and rhythm, no murmur GI/Abd improving abdominal distention and tenderness.  Bowel sound present CNS: More awake today, oriented to place and person.  Slow to respond.  Hard of hearing at  baseline. Psychiatry: Mood appropriate Extremities: Trace bilateral pedal edema, no calf tenderness  Data Review: I have personally reviewed the laboratory data and studies available.  Recent Labs  Lab 08/29/20 0143 08/29/20 0914 08/29/20 1843 08/30/20 0104 08/31/20 0516 09/01/20 0442 09/03/20 0545  WBC 10.5 10.5  --  11.2* 14.4* 12.0* 6.8  NEUTROABS 8.0*  --   --  9.2* 12.0* 9.7* 5.1  HGB 10.9* 11.0* 12.2* 11.0* 11.0* 10.8* 9.2*  HCT 31.9* 31.6* 35.5* 32.5* 32.6* 31.6* 27.0*  MCV 77.6* 77.6*  --  78.7* 79.5* 79.2* 80.6  PLT 252 254  --  229 253 270 216   Recent Labs  Lab 08/29/20 0143 08/29/20 0914 08/29/20 1042 08/30/20 0104 08/31/20 0516 09/01/20 0442 09/01/20 1424 09/01/20 2128 09/02/20 0408 09/02/20 1336 09/02/20 2106 09/03/20 0545  NA 122* 124*   < > 123* 123* 122*   < > 125* 126* 127* 129* 129*  K 2.4* 3.2*   < > 3.4* 4.2 4.2  --   --   --  3.7  --  3.7  CL 78* 81*   < > 83* 86* 85*  --   --   --  89*  --  93*  CO2 31 31   < > 31 29 27   --   --   --  30  --  28  GLUCOSE 200* 202*   < > 209* 184* 204*  --   --   --  225*  --  173*  BUN 16 15   < > 21 34* 45*  --   --   --  27*  --  26*  CREATININE 0.41* 0.49*   < > 0.62 1.12 1.40*  --   --   --  0.78  --  0.75  CALCIUM 8.5* 8.3*   < > 8.1* 8.0* 8.0*  --   --   --  8.0*  --  7.6*  MG 1.7  --   --  1.9  --   --   --   --   --   --   --  1.8  PHOS  --  2.4*  --  3.8  --   --   --   --   --   --   --  2.9   < > = values in this interval not displayed.    F/u labs ordered [employer] (From admission, onward)          Start     Ordered  09/03/20 0500  CBC with Differential/Platelet  Daily,   R     Question:  Specimen collection method  Answer:  Lab=Lab collect   09/02/20 0753   09/03/20 0263  Basic metabolic panel  Daily,   R     Question:  Specimen collection method  Answer:  Lab=Lab collect   09/02/20 0753   09/01/20 1337  Sodium  Now then every 8 hours,   TIMED     Question:  Specimen collection  method  Answer:  Lab=Lab collect   09/01/20 1336   Unscheduled  Occult blood card to lab, stool  As needed,   STAT      08/29/20 0543          Signed, Terrilee Croak, MD [facility] 09/03/2020

## 2020-09-03 NOTE — TOC Progression Note (Signed)
Transition of Care [facility]) - Progression Note    Patient Details  Name: Keith Reynolds MRN: [id number] Date of Birth: 12/27/1940  Transition of Care [facility]) CM/SW [place], RN Phone Number: 09/03/2020, 10:28 AM  Clinical Narrative:  Called patient daughter per Charge Nurse request. Daughter [name] request to change [place] to [facility]. Called [facility], bed accepted, called Insurance to request the change, authorization approved # [id number] and Ambulance [phone]. [facility] also given proposed discharge date tomorrow, 09/04/20. Daughter called back and aware of change. [facility] notified of change also.    Expected Discharge Plan: [place] Barriers to Discharge: Continued Medical Work up  Expected Discharge Plan and Services Expected Discharge Plan: [place]   Discharge Planning Services: CM Consult Post Acute Care Choice: [place] Living arrangements for the past 2 months: Single Family Home                 DME Arranged: N/A DME Agency: NA       HH Arranged: NA           Social Determinants of Health (SDOH) Interventions    Readmission Risk Interventions No flowsheet data found.

## 2020-09-04 DIAGNOSIS — Z66 Do not resuscitate: Secondary | ICD-10-CM | POA: Diagnosis not present

## 2020-09-04 DIAGNOSIS — N400 Enlarged prostate without lower urinary tract symptoms: Secondary | ICD-10-CM | POA: Diagnosis not present

## 2020-09-04 DIAGNOSIS — E871 Hypo-osmolality and hyponatremia: Secondary | ICD-10-CM | POA: Diagnosis not present

## 2020-09-04 DIAGNOSIS — Z743 Need for continuous supervision: Secondary | ICD-10-CM | POA: Diagnosis not present

## 2020-09-04 DIAGNOSIS — D649 Anemia, unspecified: Secondary | ICD-10-CM | POA: Diagnosis not present

## 2020-09-04 DIAGNOSIS — R338 Other retention of urine: Secondary | ICD-10-CM | POA: Diagnosis not present

## 2020-09-04 DIAGNOSIS — R609 Edema, unspecified: Secondary | ICD-10-CM | POA: Diagnosis present

## 2020-09-04 DIAGNOSIS — J9811 Atelectasis: Secondary | ICD-10-CM | POA: Diagnosis not present

## 2020-09-04 DIAGNOSIS — M62838 Other muscle spasm: Secondary | ICD-10-CM | POA: Diagnosis not present

## 2020-09-04 DIAGNOSIS — H543 Unqualified visual loss, both eyes: Secondary | ICD-10-CM | POA: Diagnosis not present

## 2020-09-04 DIAGNOSIS — J9601 Acute respiratory failure with hypoxia: Secondary | ICD-10-CM | POA: Diagnosis not present

## 2020-09-04 DIAGNOSIS — R638 Other symptoms and signs concerning food and fluid intake: Secondary | ICD-10-CM

## 2020-09-04 DIAGNOSIS — R339 Retention of urine, unspecified: Secondary | ICD-10-CM | POA: Diagnosis not present

## 2020-09-04 DIAGNOSIS — C19 Malignant neoplasm of rectosigmoid junction: Secondary | ICD-10-CM

## 2020-09-04 DIAGNOSIS — F5105 Insomnia due to other mental disorder: Secondary | ICD-10-CM | POA: Diagnosis not present

## 2020-09-04 DIAGNOSIS — I700 Atherosclerosis of aorta: Secondary | ICD-10-CM | POA: Diagnosis not present

## 2020-09-04 DIAGNOSIS — L89313 Pressure ulcer of right buttock, stage 3: Secondary | ICD-10-CM | POA: Diagnosis not present

## 2020-09-04 DIAGNOSIS — Z515 Encounter for palliative care: Secondary | ICD-10-CM | POA: Diagnosis not present

## 2020-09-04 DIAGNOSIS — M6281 Muscle weakness (generalized): Secondary | ICD-10-CM | POA: Diagnosis not present

## 2020-09-04 DIAGNOSIS — I639 Cerebral infarction, unspecified: Secondary | ICD-10-CM | POA: Diagnosis not present

## 2020-09-04 DIAGNOSIS — R2681 Unsteadiness on feet: Secondary | ICD-10-CM | POA: Diagnosis not present

## 2020-09-04 DIAGNOSIS — I82412 Acute embolism and thrombosis of left femoral vein: Secondary | ICD-10-CM | POA: Diagnosis not present

## 2020-09-04 DIAGNOSIS — R197 Diarrhea, unspecified: Secondary | ICD-10-CM

## 2020-09-04 DIAGNOSIS — N179 Acute kidney failure, unspecified: Secondary | ICD-10-CM | POA: Diagnosis not present

## 2020-09-04 DIAGNOSIS — W19XXXA Unspecified fall, initial encounter: Secondary | ICD-10-CM | POA: Diagnosis not present

## 2020-09-04 DIAGNOSIS — R0902 Hypoxemia: Secondary | ICD-10-CM | POA: Diagnosis not present

## 2020-09-04 DIAGNOSIS — E876 Hypokalemia: Secondary | ICD-10-CM | POA: Diagnosis not present

## 2020-09-04 DIAGNOSIS — R5381 Other malaise: Secondary | ICD-10-CM | POA: Diagnosis not present

## 2020-09-04 DIAGNOSIS — C189 Malignant neoplasm of colon, unspecified: Secondary | ICD-10-CM | POA: Diagnosis not present

## 2020-09-04 DIAGNOSIS — R0602 Shortness of breath: Secondary | ICD-10-CM | POA: Diagnosis not present

## 2020-09-04 DIAGNOSIS — R55 Syncope and collapse: Secondary | ICD-10-CM | POA: Diagnosis not present

## 2020-09-04 DIAGNOSIS — I2699 Other pulmonary embolism without acute cor pulmonale: Secondary | ICD-10-CM | POA: Diagnosis not present

## 2020-09-04 DIAGNOSIS — J189 Pneumonia, unspecified organism: Secondary | ICD-10-CM | POA: Diagnosis not present

## 2020-09-04 DIAGNOSIS — R279 Unspecified lack of coordination: Secondary | ICD-10-CM | POA: Diagnosis not present

## 2020-09-04 DIAGNOSIS — E612 Magnesium deficiency: Secondary | ICD-10-CM | POA: Diagnosis not present

## 2020-09-04 DIAGNOSIS — J90 Pleural effusion, not elsewhere classified: Secondary | ICD-10-CM | POA: Diagnosis not present

## 2020-09-04 DIAGNOSIS — I10 Essential (primary) hypertension: Secondary | ICD-10-CM | POA: Diagnosis not present

## 2020-09-04 DIAGNOSIS — R1311 Dysphagia, oral phase: Secondary | ICD-10-CM | POA: Diagnosis not present

## 2020-09-04 DIAGNOSIS — A419 Sepsis, unspecified organism: Secondary | ICD-10-CM | POA: Diagnosis not present

## 2020-09-04 DIAGNOSIS — K6389 Other specified diseases of intestine: Secondary | ICD-10-CM | POA: Diagnosis not present

## 2020-09-04 DIAGNOSIS — I959 Hypotension, unspecified: Secondary | ICD-10-CM | POA: Diagnosis not present

## 2020-09-04 DIAGNOSIS — Z736 Limitation of activities due to disability: Secondary | ICD-10-CM | POA: Diagnosis not present

## 2020-09-04 DIAGNOSIS — R000 Tachycardia, unspecified: Secondary | ICD-10-CM | POA: Diagnosis not present

## 2020-09-04 DIAGNOSIS — R0689 Other abnormalities of breathing: Secondary | ICD-10-CM | POA: Diagnosis not present

## 2020-09-04 DIAGNOSIS — I491 Atrial premature depolarization: Secondary | ICD-10-CM | POA: Diagnosis not present

## 2020-09-04 DIAGNOSIS — N3289 Other specified disorders of bladder: Secondary | ICD-10-CM | POA: Diagnosis not present

## 2020-09-04 DIAGNOSIS — E569 Vitamin deficiency, unspecified: Secondary | ICD-10-CM | POA: Diagnosis not present

## 2020-09-04 DIAGNOSIS — I878 Other specified disorders of veins: Secondary | ICD-10-CM | POA: Diagnosis not present

## 2020-09-04 DIAGNOSIS — N136 Pyonephrosis: Secondary | ICD-10-CM | POA: Diagnosis not present

## 2020-09-04 DIAGNOSIS — L89153 Pressure ulcer of sacral region, stage 3: Secondary | ICD-10-CM | POA: Diagnosis not present

## 2020-09-04 DIAGNOSIS — N1330 Unspecified hydronephrosis: Secondary | ICD-10-CM | POA: Diagnosis not present

## 2020-09-04 DIAGNOSIS — I2510 Atherosclerotic heart disease of native coronary artery without angina pectoris: Secondary | ICD-10-CM | POA: Diagnosis not present

## 2020-09-04 DIAGNOSIS — Z7189 Other specified counseling: Secondary | ICD-10-CM | POA: Diagnosis not present

## 2020-09-04 DIAGNOSIS — N321 Vesicointestinal fistula: Secondary | ICD-10-CM | POA: Diagnosis not present

## 2020-09-04 DIAGNOSIS — G959 Disease of spinal cord, unspecified: Secondary | ICD-10-CM | POA: Diagnosis not present

## 2020-09-04 DIAGNOSIS — E440 Moderate protein-calorie malnutrition: Secondary | ICD-10-CM | POA: Diagnosis not present

## 2020-09-04 DIAGNOSIS — E119 Type 2 diabetes mellitus without complications: Secondary | ICD-10-CM | POA: Diagnosis not present

## 2020-09-04 DIAGNOSIS — Z6831 Body mass index (BMI) 31.0-31.9, adult: Secondary | ICD-10-CM | POA: Diagnosis not present

## 2020-09-04 DIAGNOSIS — Z20822 Contact with and (suspected) exposure to covid-19: Secondary | ICD-10-CM | POA: Diagnosis not present

## 2020-09-04 DIAGNOSIS — I6389 Other cerebral infarction: Secondary | ICD-10-CM | POA: Diagnosis not present

## 2020-09-04 HISTORY — DX: Other retention of urine: R33.8

## 2020-09-04 HISTORY — DX: Diarrhea, unspecified: R19.7

## 2020-09-04 HISTORY — DX: Hypo-osmolality and hyponatremia: E87.1

## 2020-09-04 LAB — CBC WITH DIFFERENTIAL/PLATELET
Abs Immature Granulocytes: 0.04 10*3/uL (ref 0.00–0.07)
Basophils Absolute: 0 10*3/uL (ref 0.0–0.1)
Basophils Relative: 0 %
Eosinophils Absolute: 0.2 10*3/uL (ref 0.0–0.5)
Eosinophils Relative: 2 %
HCT: 27.1 % — ABNORMAL LOW (ref 39.0–52.0)
Hemoglobin: 9.1 g/dL — ABNORMAL LOW (ref 13.0–17.0)
Immature Granulocytes: 1 %
Lymphocytes Relative: 10 %
Lymphs Abs: 0.7 10*3/uL (ref 0.7–4.0)
MCH: 27.3 pg (ref 26.0–34.0)
MCHC: 33.6 g/dL (ref 30.0–36.0)
MCV: 81.4 fL (ref 80.0–100.0)
Monocytes Absolute: 0.7 10*3/uL (ref 0.1–1.0)
Monocytes Relative: 11 %
Neutro Abs: 5.1 10*3/uL (ref 1.7–7.7)
Neutrophils Relative %: 76 %
Platelets: 197 10*3/uL (ref 150–400)
RBC: 3.33 MIL/uL — ABNORMAL LOW (ref 4.22–5.81)
RDW: 17.3 % — ABNORMAL HIGH (ref 11.5–15.5)
Smear Review: NORMAL
WBC: 6.7 10*3/uL (ref 4.0–10.5)
nRBC: 0 % (ref 0.0–0.2)

## 2020-09-04 LAB — GLUCOSE, CAPILLARY
Glucose-Capillary: 197 mg/dL — ABNORMAL HIGH (ref 70–99)
Glucose-Capillary: 181 mg/dL — ABNORMAL HIGH (ref 70–99)

## 2020-09-04 LAB — BASIC METABOLIC PANEL
Anion gap: 6 (ref 5–15)
BUN: 20 mg/dL (ref 8–23)
CO2: 29 mmol/L (ref 22–32)
Calcium: 7.8 mg/dL — ABNORMAL LOW (ref 8.9–10.3)
Chloride: 95 mmol/L — ABNORMAL LOW (ref 98–111)
Creatinine, Ser: 0.57 mg/dL — ABNORMAL LOW (ref 0.61–1.24)
GFR, Estimated: >60 mL/min (ref >60)
Glucose, Bld: 201 mg/dL — ABNORMAL HIGH (ref 70–99)
Potassium: 3.4 mmol/L — ABNORMAL LOW (ref 3.5–5.1)
Sodium: 130 mmol/L — ABNORMAL LOW (ref 135–145)

## 2020-09-04 LAB — RESP PANEL BY RT-PCR (FLU A&B, COVID) ARPGX2
Influenza A by PCR: NEGATIVE
Influenza B by PCR: NEGATIVE
SARS Coronavirus 2 by RT PCR: NEGATIVE

## 2020-09-04 LAB — SODIUM: Sodium: 129 mmol/L — ABNORMAL LOW (ref 135–145)

## 2020-09-04 MED ORDER — ZINC OXIDE 40 % EX OINT
TOPICAL_OINTMENT | CUTANEOUS | Status: DC | PRN
  Filled 2020-09-04: qty 113

## 2020-09-04 MED ORDER — POTASSIUM CHLORIDE CRYS ER 20 MEQ PO TBCR
40.0000 meq | EXTENDED_RELEASE_TABLET | Freq: Once | ORAL | Status: AC
  Administered 2020-09-04: 09:00:00 40 meq via ORAL
  Filled 2020-09-04: qty 2

## 2020-09-04 MED ORDER — TRAZODONE HCL 50 MG PO TABS: 25.0000 mg | ORAL_TABLET | Freq: Every evening | ORAL | Status: AC | PRN

## 2020-09-04 MED ORDER — ADULT MULTIVITAMIN W/MINERALS CH: 1.0000 | ORAL_TABLET | Freq: Every day | ORAL | Status: DC

## 2020-09-04 MED ORDER — NYSTATIN 100000 UNIT/GM EX POWD: Freq: Two times a day (BID) | CUTANEOUS | 0 refills | Status: AC

## 2020-09-04 MED ORDER — LOPERAMIDE HCL 2 MG PO CAPS
2.0000 mg | ORAL_CAPSULE | ORAL | 0 refills | Status: DC | PRN
Start: 2020-09-04 — End: 2020-09-27

## 2020-09-04 MED ORDER — ACETAMINOPHEN 325 MG PO TABS: 650.0000 mg | ORAL_TABLET | ORAL | Status: DC | PRN

## 2020-09-04 MED ORDER — CYCLOBENZAPRINE HCL 10 MG PO TABS
5.0000 mg | ORAL_TABLET | Freq: Three times a day (TID) | ORAL | 0 refills | Status: AC | PRN
Start: 2020-09-04 — End: ?

## 2020-09-04 MED ORDER — PROMETHAZINE HCL 12.5 MG PO TABS
12.5000 mg | ORAL_TABLET | Freq: Four times a day (QID) | ORAL | 0 refills | Status: AC | PRN
Start: 2020-09-04 — End: ?

## 2020-09-04 MED ORDER — TAMSULOSIN HCL 0.4 MG PO CAPS: 0.4000 mg | ORAL_CAPSULE | Freq: Every day | ORAL | Status: DC

## 2020-09-04 NOTE — TOC Transition Note (Signed)
Transition of Care [facility]) - CM/SW Discharge Note   Patient Details  Name: Keith Reynolds MRN: [id number] Date of Birth: Nov 24, 1940  Transition of Care [facility]) CM/SW Contact:  Shelbie Hutching, RN Phone Number: 09/04/2020, 12:50 PM   Clinical Narrative:    Patient medically cleared for discharge to [facility] today.  Bedside RN has called report and RNCM has arranged EMS transport.  Patient is next up for transport.  Daughter is at the bedside and aware of DC today.    Final next level of care: Skilled Nursing Facility Barriers to Discharge: Barriers Resolved   Patient Goals and CMS Choice Patient states their goals for this hospitalization and ongoing recovery are:: Patient's family is in agreement with patient getting short term rehab at [facility] CMS Medicare.gov Compare Post Acute Care list provided to:: Patient Represenative (must comment) Choice offered to / list presented to : Adult Children  Discharge Placement PASRR number recieved: 08/30/20            Patient chooses bed at: [facility] [place] Patient to be transferred to facility by: [facility] Name of family member notified: [name] Patient and family notified of of transfer: 09/04/20  Discharge Plan and Services   Discharge Planning Services: CM Consult Post Acute Care Choice: [place]          DME Arranged: N/A DME Agency: NA       HH Arranged: NA          Social Determinants of Health (SDOH) Interventions     Readmission Risk Interventions No flowsheet data found.

## 2020-09-04 NOTE — Discharge Summary (Signed)
Physician Discharge Summary   Keith Reynolds OZD:[id number] DOB: [birth year] DOA: 08/29/2020  PCP: Sofie Hartigan, MD  Admit date: 08/29/2020 Discharge date:  09/04/2020  Admitted From: Home Disposition:  [facility] Discharging physician: Dwyane Dee, MD  Recommendations for Outpatient Follow-up:  1. Follow up with urology 2. Follow up with surgery at [facility]   Patient discharged to SNF in Discharge Condition: stable Risk of unplanned readmission score: Unplanned Admission- Pilot do not use: 14.69  CODE STATUS: Full Diet recommendation:  Diet Orders (From admission, onward)    Start     Ordered   09/04/20 0000  Diet Carb Modified        09/04/20 1004   09/01/20 0801  Diet Carb Modified Fluid consistency: Thin; Room service appropriate? Yes  Diet effective now       Question Answer Comment  Diet-HS Snack? Nothing   Calorie Level Medium 1600-2000   Fluid consistency: Thin   Room service appropriate? Yes      09/01/20 0800          Hospital Course: Keith Reynolds is a 80 y.o. male with PMH significant for essential hypertension and recently diagnosed colon cancer, for which he has a pending follow up at [facility]. Patient presented to the ED with complaint of acute onset bilateral lower extremity pain, progressively worsening for 3 weeks associated with cramps in hands impaired mobility.   He has been having diarrhea for 3 weeks with generalized weakness poor appetite.   In the ED, blood pressure was elevated to 186/93 Labs showed sodium level low at 122, potassium low at 2.4, blood glucose level at 200, hemoglobin at 10.9, low compared to 12.1 in March.  WBC 10.5. Ultrasound duplex of both lower extremities were normal.   Patient was given potassium replacement, magnesium replacement   Acute persistent diarrhea - improved -Patient presented with diarrhea for 3 weeks. Probably related to cancer.  Improved with Imodium. -GI pathogen panel normal.  Adequately hydrated. Encourage oral hydration -Continue to monitor bowel movement.  Acute urinary retention -5/22, patient was noted to have abdominal distention, firmness.   -CT abdomen was obtained which showed an acute urinary retention. Foley catheter was inserted that drained about 2 L of milky looking urine.  Urinalysis with large number of leukocytes.   -No fever.  WBC count improved without antibiotics. -5/20, discussed with urologist Dr. Diamantina Providence.  He reviewed the scan.  Patient has a known colonic mass which is not compressing on the ureters.  Imaging did not suggest any urothelial tumors or stones.  Bilateral hydronephrosis is probably secondary to bladder outlet obstruction which is now relieved.  Dilated ureters probably will take some time to look normal.  Flomax has been started.  Dr. Diamantina Providence to arrange voiding trial as an outpatient in 1 to 2 weeks. - foley remains in place at discharge until urology follow up  Hypokalemia Hypomagnesemia Hypophosphatemia -Secondary to persistent diarrhea.  IV and oral replacement given.  Levels normalized with replacement -Repeat labs intermittently.  AKI- resolved  -Creatinine slightly worsened to peak at 1.4.  - improved with IVF - creat 0.57 at discharge   Severe hyponatremia - improved/stable -Sodium level low at 122 at presentation.   - etiology likely mixed in setting of hypovolemia from diarrhea/poor intake and possible SIADH in setting of underlying malignancy - followed by nephrology during hospitalization too  - Na stable at 130 at discharge   Generalized weakness Muscle cramps -Probably due to electrolyte abnormalities and  dehydration.  Currently on Flexeril as needed for muscle spasm. -Encourage ambulation. PT eval appreciated.  SNF recommended  Recent diagnosis of colon cancer -CT abdomen 5/22 redemonstrated a 16 x 6 cm mass is seen involving the proximal sigmoid colon causing at least partial obstruction and consistent  with the history of colonic malignancy. -Patient had an outpatient visit scheduled with surgeon at [facility] this week.  However because of hospitalization, his visit has been postponed to next week.  Mild chronic microcytic anemia -Hemoglobin level stable.    Dyslipidemia -patient no longer taking statin  Bilateral pedal edema -Intermittent for last several months.  More prominent now.  Albumin level at 3. -Ultrasound duplex lower extremities negative.  Echocardiogram with EF normal at 60 to 89%, grade 1 diastolic dysfunction. -Apply Ace wrap from both legs.  Recommend leg elevation.  Impaired mobility/generalized weakness -PT eval obtained.  SNF recommended.   Principal Diagnosis: Diarrhea  Discharge Diagnoses: Active Hospital Problems   Diagnosis Date Noted  . Diarrhea 09/04/2020  . Hyponatremia 09/04/2020  . Decreased oral intake 09/04/2020  . Acute urinary retention 09/04/2020  . Colon cancer ([place]) 09/04/2020  . Generalized muscle weakness 09/04/2020  . Pressure injury of skin 08/30/2020  . Malnutrition of moderate degree 08/30/2020  . Hypokalemia 08/29/2020    Resolved Hospital Problems  No resolved problems to display.    Discharge Instructions    Diet Carb Modified   Complete by: As directed    Discharge wound care:   Complete by: As directed    Off load heels when in bed. Offload from sacrum as able with OOB to chair with meals as able.   Increase activity slowly   Complete by: As directed      Allergies as of 09/04/2020      Reactions   Antihistamines, Diphenhydramine-type    Bactrim [sulfamethoxazole-trimethoprim] Nausea Only   Chamomile Other (See Comments)   Diphenhydramine Hcl Other (See Comments)   dizziness   Gabapentin    Levomenol    Pseudoephedrine Hcl Other (See Comments)   dizziness   Rosuvastatin    Triprolidine Hcl Other (See Comments)   dizziness      Medication List    STOP taking these medications   atorvastatin 10 MG  tablet Commonly known as: LIPITOR   lisinopril 5 MG tablet Commonly known as: ZESTRIL     TAKE these medications   acetaminophen 325 MG tablet Commonly known as: TYLENOL Take 2 tablets (650 mg total) by mouth every 4 (four) hours as needed for mild pain or moderate pain (or Fever >/= 101).   cyclobenzaprine 10 MG tablet Commonly known as: FLEXERIL Take 0.5 tablets (5 mg total) by mouth 3 (three) times daily as needed for muscle spasms.   loperamide 2 MG capsule Commonly known as: IMODIUM Take 1 capsule (2 mg total) by mouth every 4 (four) hours as needed for diarrhea or loose stools.   multivitamin with minerals Tabs tablet Take 1 tablet by mouth daily. Start taking on: Sep 05, 2020   nystatin powder Commonly known as: MYCOSTATIN/NYSTOP Apply topically 2 (two) times daily.   promethazine 12.5 MG tablet Commonly known as: PHENERGAN Take 1 tablet (12.5 mg total) by mouth every 6 (six) hours as needed for nausea.   tamsulosin 0.4 MG Caps capsule Commonly known as: FLOMAX Take 1 capsule (0.4 mg total) by mouth daily. Start taking on: Sep 05, 2020   traZODone 50 MG tablet Commonly known as: DESYREL Take 0.5 tablets (25 mg total)  by mouth at bedtime as needed for sleep.            Discharge Care Instructions  (From admission, onward)         Start     Ordered   09/04/20 0000  Discharge wound care:       Comments: Off load heels when in bed. Offload from sacrum as able with OOB to chair with meals as able.   09/04/20 1004          Contact information for after-discharge care    Destination    [facility] Preferred SNF .   Service: Skilled Nursing Contact information: [place] [phone]                 Allergies  Allergen Reactions  . Antihistamines, Diphenhydramine-Type   . Bactrim [Sulfamethoxazole-Trimethoprim] Nausea Only  . Chamomile Other (See Comments)  . Diphenhydramine Hcl  Other (See Comments)    dizziness  . Gabapentin   . Levomenol   . Pseudoephedrine Hcl Other (See Comments)    dizziness  . Rosuvastatin   . Triprolidine Hcl Other (See Comments)    dizziness    Consultations: Nephrology Urology  Discharge Exam: BP 138/72 (BP Location: Left Arm)   Pulse 87   Temp 98.8 F (37.1 C) (Oral)   Resp 14   Ht 5\' 7"  (1.702 m)   Wt 94.8 kg Comment: bed weight  SpO2 94%   BMI 32.73 kg/m  General appearance: alert, cooperative and no distress Head: Normocephalic, without obvious abnormality, atraumatic Eyes: EOMI Lungs: clear to auscultation bilaterally Heart: regular rate and rhythm and S1, S2 normal Abdomen: distended; irregular mass palpable in lower abdomen; no true TTP; BS present Extremities: 2+ LE pitting edema bilaterally Skin: mobility and turgor normal Neurologic: Grossly normal  The results of significant diagnostics from this hospitalization (including imaging, microbiology, ancillary and laboratory) are listed below for reference.   Microbiology: Recent Results (from the past 240 hour(s))  Resp Panel by RT-PCR (Flu A&B, Covid) Nasopharyngeal Swab     Status: None   Collection Time: 08/29/20  4:47 AM   Specimen: Nasopharyngeal Swab; Nasopharyngeal(NP) swabs in vial transport medium  Result Value Ref Range Status   SARS Coronavirus 2 by RT PCR NEGATIVE NEGATIVE Final    Comment: (NOTE) SARS-CoV-2 target nucleic acids are NOT DETECTED.  The SARS-CoV-2 RNA is generally detectable in upper respiratory specimens during the acute phase of infection. The lowest concentration of SARS-CoV-2 viral copies this assay can detect is 138 copies/mL. A negative result does not preclude SARS-Cov-2 infection and should not be used as the sole basis for treatment or other patient management decisions. A negative result may occur with  improper specimen collection/handling, submission of specimen other than nasopharyngeal swab, presence of viral  mutation(s) within the areas targeted by this assay, and inadequate number of viral copies(<138 copies/mL). A negative result must be combined with clinical observations, patient history, and epidemiological information. The expected result is Negative.  Fact Sheet for Patients:  EntrepreneurPulse.com.au  Fact Sheet for Healthcare Providers:  IncredibleEmployment.be  This test is no t yet approved or cleared by the [place] FDA and  has been authorized for detection and/or diagnosis of SARS-CoV-2 by FDA under an Emergency Use Authorization (EUA). This EUA will remain  in effect (meaning this test can be used) for the duration of the COVID-19 declaration under Section 564(b)(1) of the Act, 21 U.S.C.section 360bbb-3(b)(1), unless the authorization  is terminated  or revoked sooner.       Influenza A by PCR NEGATIVE NEGATIVE Final   Influenza B by PCR NEGATIVE NEGATIVE Final    Comment: (NOTE) The Xpert Xpress SARS-CoV-2/FLU/RSV plus assay is intended as an aid in the diagnosis of influenza from Nasopharyngeal swab specimens and should not be used as a sole basis for treatment. Nasal washings and aspirates are unacceptable for Xpert Xpress SARS-CoV-2/FLU/RSV testing.  Fact Sheet for Patients: EntrepreneurPulse.com.au  Fact Sheet for Healthcare Providers: IncredibleEmployment.be  This test is not yet approved or cleared by the [place] FDA and has been authorized for detection and/or diagnosis of SARS-CoV-2 by FDA under an Emergency Use Authorization (EUA). This EUA will remain in effect (meaning this test can be used) for the duration of the COVID-19 declaration under Section 564(b)(1) of the Act, 21 U.S.C. section 360bbb-3(b)(1), unless the authorization is terminated or revoked.  Performed at [facility], [address] [place], [place] [address]   Gastrointestinal Panel by PCR ,  Stool     Status: None   Collection Time: 08/29/20 10:42 AM   Specimen: Stool  Result Value Ref Range Status   Campylobacter species NOT DETECTED NOT DETECTED Final   Plesimonas shigelloides NOT DETECTED NOT DETECTED Final   Salmonella species NOT DETECTED NOT DETECTED Final   Yersinia enterocolitica NOT DETECTED NOT DETECTED Final   Vibrio species NOT DETECTED NOT DETECTED Final   Vibrio cholerae NOT DETECTED NOT DETECTED Final   Enteroaggregative E coli (EAEC) NOT DETECTED NOT DETECTED Final   Enteropathogenic E coli (EPEC) NOT DETECTED NOT DETECTED Final   Enterotoxigenic E coli (ETEC) NOT DETECTED NOT DETECTED Final   Shiga like toxin producing E coli (STEC) NOT DETECTED NOT DETECTED Final   Shigella/Enteroinvasive E coli (EIEC) NOT DETECTED NOT DETECTED Final   Cryptosporidium NOT DETECTED NOT DETECTED Final   Cyclospora cayetanensis NOT DETECTED NOT DETECTED Final   Entamoeba histolytica NOT DETECTED NOT DETECTED Final   Giardia lamblia NOT DETECTED NOT DETECTED Final   Adenovirus F40/41 NOT DETECTED NOT DETECTED Final   Astrovirus NOT DETECTED NOT DETECTED Final   Norovirus GI/GII NOT DETECTED NOT DETECTED Final   Rotavirus A NOT DETECTED NOT DETECTED Final   Sapovirus (I, II, IV, and V) NOT DETECTED NOT DETECTED Final    Comment: Performed at [facility], [address] [place], [place] [address]     Labs: BNP (last 3 results) No results for input(s): BNP in the last 8760 hours. Basic Metabolic Panel: Recent Labs  Lab 08/29/20 0143 08/29/20 0914 08/29/20 1042 08/30/20 0104 08/31/20 0516 09/01/20 0442 09/01/20 1424 09/02/20 1336 09/02/20 2106 09/03/20 0545 09/03/20 1325 09/03/20 2107 09/04/20 0515  NA 122* 124*   < > 123* 123* 122*   < > 127* 129* 129* 128* 130* 130*  K 2.4* 3.2*   < > 3.4* 4.2 4.2  --  3.7  --  3.7  --   --  3.4*  CL 78* 81*   < > 83* 86* 85*  --  89*  --  93*  --   --  95*  CO2 31 31   < > 31 29 27   --  30  --  28  --   --  29   GLUCOSE 200* 202*   < > 209* 184* 204*  --  225*  --  173*  --   --  201*  BUN 16 15   < > 21 34*  45*  --  27*  --  26*  --   --  20  CREATININE 0.41* 0.49*   < > 0.62 1.12 1.40*  --  0.78  --  0.75  --   --  0.57*  CALCIUM 8.5* 8.3*   < > 8.1* 8.0* 8.0*  --  8.0*  --  7.6*  --   --  7.8*  MG 1.7  --   --  1.9  --   --   --   --   --  1.8  --   --   --   PHOS  --  2.4*  --  3.8  --   --   --   --   --  2.9  --   --   --    < > = values in this interval not displayed.   Liver Function Tests: Recent Labs  Lab 08/29/20 0143  AST 22  ALT 14  ALKPHOS 64  BILITOT 1.4*  PROT 6.3*  ALBUMIN 3.0*   No results for input(s): LIPASE, AMYLASE in the last 168 hours. No results for input(s): AMMONIA in the last 168 hours. CBC: Recent Labs  Lab 08/30/20 0104 08/31/20 0516 09/01/20 0442 09/03/20 0545 09/04/20 0515  WBC 11.2* 14.4* 12.0* 6.8 6.7  NEUTROABS 9.2* 12.0* 9.7* 5.1 5.1  HGB 11.0* 11.0* 10.8* 9.2* 9.1*  HCT 32.5* 32.6* 31.6* 27.0* 27.1*  MCV 78.7* 79.5* 79.2* 80.6 81.4  PLT 229 253 270 216 197   Cardiac Enzymes: No results for input(s): CKTOTAL, CKMB, CKMBINDEX, TROPONINI in the last 168 hours. BNP: Invalid input(s): POCBNP CBG: Recent Labs  Lab 09/03/20 0805 09/03/20 1130 09/03/20 1702 09/03/20 2022 09/04/20 0808  GLUCAP 201* 192* 162* 193* 197*   D-Dimer No results for input(s): DDIMER in the last 72 hours. Hgb A1c No results for input(s): HGBA1C in the last 72 hours. Lipid Profile No results for input(s): CHOL, HDL, LDLCALC, TRIG, CHOLHDL, LDLDIRECT in the last 72 hours. Thyroid function studies No results for input(s): TSH, T4TOTAL, T3FREE, THYROIDAB in the last 72 hours.  Invalid input(s): FREET3 Anemia work up No results for input(s): VITAMINB12, FOLATE, FERRITIN, TIBC, IRON, RETICCTPCT in the last 72 hours. Urinalysis    Component Value Date/Time   COLORURINE YELLOW (A) 09/01/2020 1925   APPEARANCEUR CLOUDY (A) 09/01/2020 1925   LABSPEC 1.006  09/01/2020 1925   PHURINE 6.0 09/01/2020 1925   GLUCOSEU NEGATIVE 09/01/2020 1925   HGBUR LARGE (A) 09/01/2020 1925   BILIRUBINUR NEGATIVE 09/01/2020 1925   BILIRUBINUR neg 06/30/2017 1055   KETONESUR NEGATIVE 09/01/2020 1925   PROTEINUR NEGATIVE 09/01/2020 1925   UROBILINOGEN 0.2 06/30/2017 1055   NITRITE NEGATIVE 09/01/2020 1925   LEUKOCYTESUR LARGE (A) 09/01/2020 1925   Sepsis Labs Invalid input(s): PROCALCITONIN,  WBC,  LACTICIDVEN Microbiology Recent Results (from the past 240 hour(s))  Resp Panel by RT-PCR (Flu A&B, Covid) Nasopharyngeal Swab     Status: None   Collection Time: 08/29/20  4:47 AM   Specimen: Nasopharyngeal Swab; Nasopharyngeal(NP) swabs in vial transport medium  Result Value Ref Range Status   SARS Coronavirus 2 by RT PCR NEGATIVE NEGATIVE Final    Comment: (NOTE) SARS-CoV-2 target nucleic acids are NOT DETECTED.  The SARS-CoV-2 RNA is generally detectable in upper respiratory specimens during the acute phase of infection. The lowest concentration of SARS-CoV-2 viral copies this assay can detect is 138 copies/mL. A negative result does not preclude SARS-Cov-2 infection and should not be used as the  sole basis for treatment or other patient management decisions. A negative result may occur with  improper specimen collection/handling, submission of specimen other than nasopharyngeal swab, presence of viral mutation(s) within the areas targeted by this assay, and inadequate number of viral copies(<138 copies/mL). A negative result must be combined with clinical observations, patient history, and epidemiological information. The expected result is Negative.  Fact Sheet for Patients:  EntrepreneurPulse.com.au  Fact Sheet for Healthcare Providers:  IncredibleEmployment.be  This test is no t yet approved or cleared by the [place] FDA and  has been authorized for detection and/or diagnosis of SARS-CoV-2 by FDA under  an Emergency Use Authorization (EUA). This EUA will remain  in effect (meaning this test can be used) for the duration of the COVID-19 declaration under Section 564(b)(1) of the Act, 21 U.S.C.section 360bbb-3(b)(1), unless the authorization is terminated  or revoked sooner.       Influenza A by PCR NEGATIVE NEGATIVE Final   Influenza B by PCR NEGATIVE NEGATIVE Final    Comment: (NOTE) The Xpert Xpress SARS-CoV-2/FLU/RSV plus assay is intended as an aid in the diagnosis of influenza from Nasopharyngeal swab specimens and should not be used as a sole basis for treatment. Nasal washings and aspirates are unacceptable for Xpert Xpress SARS-CoV-2/FLU/RSV testing.  Fact Sheet for Patients: EntrepreneurPulse.com.au  Fact Sheet for Healthcare Providers: IncredibleEmployment.be  This test is not yet approved or cleared by the [place] FDA and has been authorized for detection and/or diagnosis of SARS-CoV-2 by FDA under an Emergency Use Authorization (EUA). This EUA will remain in effect (meaning this test can be used) for the duration of the COVID-19 declaration under Section 564(b)(1) of the Act, 21 U.S.C. section 360bbb-3(b)(1), unless the authorization is terminated or revoked.  Performed at [facility], [address] [place], [place] [address]   Gastrointestinal Panel by PCR , Stool     Status: None   Collection Time: 08/29/20 10:42 AM   Specimen: Stool  Result Value Ref Range Status   Campylobacter species NOT DETECTED NOT DETECTED Final   Plesimonas shigelloides NOT DETECTED NOT DETECTED Final   Salmonella species NOT DETECTED NOT DETECTED Final   Yersinia enterocolitica NOT DETECTED NOT DETECTED Final   Vibrio species NOT DETECTED NOT DETECTED Final   Vibrio cholerae NOT DETECTED NOT DETECTED Final   Enteroaggregative E coli (EAEC) NOT DETECTED NOT DETECTED Final   Enteropathogenic E coli (EPEC) NOT DETECTED NOT DETECTED  Final   Enterotoxigenic E coli (ETEC) NOT DETECTED NOT DETECTED Final   Shiga like toxin producing E coli (STEC) NOT DETECTED NOT DETECTED Final   Shigella/Enteroinvasive E coli (EIEC) NOT DETECTED NOT DETECTED Final   Cryptosporidium NOT DETECTED NOT DETECTED Final   Cyclospora cayetanensis NOT DETECTED NOT DETECTED Final   Entamoeba histolytica NOT DETECTED NOT DETECTED Final   Giardia lamblia NOT DETECTED NOT DETECTED Final   Adenovirus F40/41 NOT DETECTED NOT DETECTED Final   Astrovirus NOT DETECTED NOT DETECTED Final   Norovirus GI/GII NOT DETECTED NOT DETECTED Final   Rotavirus A NOT DETECTED NOT DETECTED Final   Sapovirus (I, II, IV, and V) NOT DETECTED NOT DETECTED Final    Comment: Performed at [facility], [address], [place], [place] [address]    Procedures/Studies: CT ABDOMEN PELVIS WO CONTRAST  Result Date: 09/01/2020 CLINICAL DATA:  Abdominal pain.  History of colon cancer. EXAM: CT ABDOMEN AND PELVIS WITHOUT CONTRAST TECHNIQUE: Multidetector CT imaging of the abdomen and pelvis was performed following the  standard protocol without IV contrast. COMPARISON:  July 12, 2020. FINDINGS: Lower chest: Left lower lobe atelectasis or pneumonia is noted with associated pleural effusion. Small right pleural effusion is noted with adjacent subsegmental atelectasis. Hepatobiliary: No focal liver abnormality is seen. No gallstones, gallbladder wall thickening, or biliary dilatation. Pancreas: Unremarkable. No pancreatic ductal dilatation or surrounding inflammatory changes. Spleen: Normal in size without focal abnormality. Adrenals/Urinary Tract: Adrenal glands appear normal. Mild bilateral hydronephrosis is noted without obstructing calculus or ureteral dilatation. Moderate urinary bladder distention is noted. Stomach/Bowel: The stomach appears normal. There is no small bowel dilatation. 16 x 6 cm mass is seen in the proximal sigmoid colon that is causing at least partial  obstruction and consistent with a history of colonic malignancy. Vascular/Lymphatic: Aortic atherosclerosis. No enlarged abdominal or pelvic lymph nodes. Reproductive: Stable moderate prostatic enlargement. Other: There does appear to be subcutaneous edema involving the visualized portion of the proximal left thigh. Musculoskeletal: No acute or significant osseous findings. IMPRESSION: Left lower lobe atelectasis or pneumonia is noted with associated pleural effusion. Small right pleural effusion is noted with adjacent subsegmental atelectasis. Mild bilateral hydronephrosis is noted without obstructing calculus or ureteral dilatation. Moderate urinary bladder distention is noted. 16 x 6 cm mass is seen involving the proximal sigmoid colon which is causing at least partial obstruction and consistent with the history of colonic malignancy. Stable moderate prostatic enlargement. Subcutaneous edema is seen involving the visualized portion of the proximal left thigh. Aortic Atherosclerosis (ICD10-I70.0). Electronically Signed   By: Marijo Conception M.D.   On: 09/01/2020 15:29   US RENAL  Result Date: 09/01/2020 CLINICAL DATA:  ATN EXAM: RENAL / URINARY TRACT ULTRASOUND COMPLETE COMPARISON:  CT 09/01/2020 FINDINGS: Right Kidney: Renal measurements: 12.7 x 6.7 x 5.6 cm = volume: 252 mL. Moderate right hydronephrosis, similar to prior CT. Normal echotexture. No mass. Left Kidney: Renal measurements: 13.8 x 6.3 x 5.7 cm = volume: 260 mL. 12 mm medial midpole cyst. Mild left hydronephrosis, similar to prior study. Bladder: Layering debris within the bladder.  No bladder wall thickening. Other: None. IMPRESSION: Bilateral hydronephrosis, right greater than left, similar to prior CT. Layering debris within the bladder. Electronically Signed   By: Rolm Baptise M.D.   On: 09/01/2020 15:24   US Venous Img Lower Bilateral  Result Date: 08/29/2020 CLINICAL DATA:  Bilateral leg swelling and pain EXAM: BILATERAL LOWER EXTREMITY  VENOUS DOPPLER ULTRASOUND TECHNIQUE: Gray-scale sonography with compression, as well as color and duplex ultrasound, were performed to evaluate the deep venous system(s) from the level of the common femoral vein through the popliteal and proximal calf veins. COMPARISON:  None. FINDINGS: VENOUS Normal compressibility of the common femoral, superficial femoral, and popliteal veins, as well as the visualized calf veins. Visualized portions of profunda femoral vein and great saphenous vein unremarkable. No filling defects to suggest DVT on grayscale or color Doppler imaging. Doppler waveforms show normal direction of venous flow, normal respiratory plasticity and response to augmentation. OTHER None. Limitations: none IMPRESSION: Negative. Electronically Signed   By: Fidela Salisbury MD   On: 08/29/2020 06:05   DG Abd Portable 1V  Result Date: 09/01/2020 CLINICAL DATA:  Rigidity of the abdomen, history of colon cancer with hernia and hypertension, former smoker. EXAM: PORTABLE ABDOMEN - 1 VIEW COMPARISON:  CT abdomen and pelvis from April of 2021. FINDINGS: Elevated LEFT hemidiaphragm as before with signs of potential LEFT lower lobe airspace disease. Tortuous and distended colon in the central abdomen. Colon measuring up  to 11 cm. This could represent the cecum in the midline based on previous imaging. Small amount of rectal gas likely present though difficult to determine IMPRESSION: 1. Tortuous and distended colon in the central abdomen up to 12 cm. Findings are concerning for worsening distal colonic obstruction in the setting of colonic mass. CT may be helpful for further assessment. 2. Elevated LEFT hemidiaphragm with signs of potential LEFT lower lobe airspace disease. Electronically Signed   By: Zetta Bills M.D.   On: 09/01/2020 12:20   ECHOCARDIOGRAM COMPLETE  Result Date: 08/30/2020    ECHOCARDIOGRAM REPORT   Patient Name:   DESMOND SZABO Date of Exam: 08/29/2020 Medical Rec #:  [id number]           Height:       67.0 in Accession #:    [id number]         Weight:       140.0 lb Date of Birth:  December 15, 1940          BSA:          1.738 m Patient Age:    78 years           BP:           159/77 mmHg Patient Gender: M                  HR:           87 bpm. Exam Location:  [facility] Procedure: 2D Echo, Cardiac Doppler and Color Doppler Indications:     R94.31 Abnormal EKG  History:         Patient has no prior history of Echocardiogram examinations.                  Risk Factors:Hypertension.  Sonographer:     Wilford Sports Rodgers-Jones Referring Phys:  [id number] [place] Diagnosing Phys: Yolonda Kida MD IMPRESSIONS  1. Left ventricular ejection fraction, by estimation, is 60 to 65%. The left ventricle has normal function. The left ventricle has no regional wall motion abnormalities. Left ventricular diastolic parameters are consistent with Grade I diastolic dysfunction (impaired relaxation).  2. Right ventricular systolic function is normal. The right ventricular size is normal.  3. The mitral valve is normal in structure. No evidence of mitral valve regurgitation.  4. The aortic valve is grossly normal. Aortic valve regurgitation is not visualized. FINDINGS  Left Ventricle: Left ventricular ejection fraction, by estimation, is 60 to 65%. The left ventricle has normal function. The left ventricle has no regional wall motion abnormalities. The left ventricular internal cavity size was normal in size. There is  borderline left ventricular hypertrophy. Left ventricular diastolic parameters are consistent with Grade I diastolic dysfunction (impaired relaxation). Right Ventricle: The right ventricular size is normal. No increase in right ventricular wall thickness. Right ventricular systolic function is normal. Left Atrium: Left atrial size was normal in size. Right Atrium: Right atrial size was normal in size. Pericardium: There is no evidence of pericardial effusion. Mitral Valve: The mitral valve is normal in structure.  No evidence of mitral valve regurgitation. Tricuspid Valve: The tricuspid valve is normal in structure. Tricuspid valve regurgitation is trivial. Aortic Valve: The aortic valve is grossly normal. Aortic valve regurgitation is not visualized. Pulmonic Valve: The pulmonic valve was normal in structure. Pulmonic valve regurgitation is not visualized. Aorta: The ascending aorta was not well visualized. IAS/Shunts: No atrial level shunt detected by color flow Doppler.  LEFT VENTRICLE PLAX 2D LVIDd:  4.89 cm  Diastology LVIDs:         3.18 cm  LV e' medial:    5.22 cm/s LV PW:         0.94 cm  LV E/e' medial:  12.8 LV IVS:        0.96 cm  LV e' lateral:   6.64 cm/s LVOT diam:     2.00 cm  LV E/e' lateral: 10.1 LV SV:         68 LV SV Index:   39 LVOT Area:     3.14 cm  RIGHT VENTRICLE             IVC RV Basal diam:  3.77 cm     IVC diam: 1.37 cm RV S prime:     23.20 cm/s TAPSE (M-mode): 2.8 cm LEFT ATRIUM             Index       RIGHT ATRIUM           Index LA diam:        4.80 cm 2.76 cm/m  RA Area:     11.40 cm LA Vol (A2C):   46.8 ml 26.93 ml/m RA Volume:   25.10 ml  14.44 ml/m LA Vol (A4C):   47.3 ml 27.22 ml/m LA Biplane Vol: 47.8 ml 27.51 ml/m  AORTIC VALVE LVOT Vmax:   103.00 cm/s LVOT Vmean:  75.700 cm/s LVOT VTI:    0.216 m  AORTA Ao Root diam: 3.20 cm MITRAL VALVE MV Area (PHT): 2.62 cm    SHUNTS MV Decel Time: 289 msec    Systemic VTI:  0.22 m MV E velocity: 66.80 cm/s  Systemic Diam: 2.00 cm MV A velocity: 94.30 cm/s MV E/A ratio:  0.71 Dwayne D Callwood MD Electronically signed by Yolonda Kida MD Signature Date/Time: 08/30/2020/12:35:09 PM    Final      Time coordinating discharge: Over 30 minutes    Dwyane Dee, MD  [facility] 09/04/2020, 10:15 AM

## 2020-09-04 NOTE — Discharge Instructions (Signed)
Chronic Diarrhea Chronic diarrhea is a condition in which a person passes frequent loose and watery stools for 4 weeks or longer. Non-chronic diarrhea usually lasts for only 2-3 days. Diarrhea can cause a person to feel weak and dehydrated. Dehydration can make the person tired and thirsty. It can also cause a dry mouth, decreased urination, and dark yellow urine. Diarrhea is a sign of an underlying problem, such as:  Infection.  Side effects of medicines.  Problems digesting something in your diet, such as milk products if you have lactose intolerance.  Conditions such as celiac disease, irritable bowel syndrome (IBS), or inflammatory bowel disease (IBD). If you have chronic diarrhea, make sure you treat it as told by your health care provider. Follow these instructions at home: Medicines  Take over-the-counter and prescription medicines only as told by your health care provider.  If you were prescribed an antibiotic medicine, take it as told by your health care provider. Do not stop taking the antibiotic even if you start to feel better. Eating and drinking  Follow instructions from your health care provider about what to eat and drink. You may have to: ? Avoid foods that trigger diarrhea for you. ? Take an oral rehydration solution (ORS). This is a drink that keeps you hydrated. It can be found at pharmacies and retail stores. ? Drink clear fluids, such as water, diluted fruit juice, and low-calorie sports drinks. You can also get fluids by sucking on ice chips. ? Drink enough fluid to keep your urine pale yellow. This will help you avoid dehydration. ? Eat small amounts of bland foods that are easy to digest as you are able. These foods include bananas, applesauce, rice, lean meats, toast, and crackers. ? Avoid spicy or fatty foods. ? Avoid foods and beverages that contain a lot of sugar or caffeine.  Do not drink alcohol if: ? Your health care provider tells you not to  drink. ? You are pregnant, may be pregnant, or are planning to become pregnant.  If you drink alcohol: ? Limit how much you use to:  0-1 drink a day for women.  0-2 drinks a day for men. ? Be aware of how much alcohol is in your drink. In the [place] one drink equals one 12 oz bottle of beer (355 mL), one 5 oz glass of wine (148 mL), or one 1 oz glass of hard liquor (44 mL).   General instructions  Wash your hands often and after each diarrhea episode. Use soap and water. If soap and water are not available, use hand sanitizer.  Make sure that all people in your household wash their hands well and often.  Rest as told by your health care provider.  Watch your condition for any changes.  Take a warm bath to relieve any burning or pain from frequent diarrhea episodes.  Keep all follow-up visits as told by your health care provider. This is important.   Contact a health care provider if:  You have a fever.  Your diarrhea gets worse or does not get better.  You have new symptoms.  You cannot drink fluid without vomiting.  You feel light-headed or dizzy.  You have a headache.  You have muscle cramps.  You have severe pain in the rectum. Get help right away if:  You have vomiting that does not go away.  You have chest pain.  You feel very weak or you faint.  You have bloody or black stools, or stools that look   like tar.  You have severe pain, cramping, or bloating in your abdomen, or pain that stays in one place.  You have trouble breathing or you are breathing very quickly.  Your heart is beating very quickly.  Your skin feels cold and clammy.  You feel confused.  You have a severe headache.  You have signs or symptoms of dehydration, such as: ? Dark urine, very little urine, or no urine. ? Cracked lips. ? Dry mouth. ? Sunken eyes. ? Sleepiness. ? Weakness. These symptoms may represent a serious problem that is an emergency. Do not wait to see if the  symptoms will go away. Get medical help right away. Call your local emergency services (911 in the [place] Do not drive yourself to the hospital. Summary  Chronic diarrhea is a condition in which a person passes frequent loose and watery stools for 4 weeks or longer.  Diarrhea is a sign of an underlying problem.  Make sure you treat your diarrhea as told by your health care provider.  Drink enough fluid to keep your urine pale yellow. This will help you avoid dehydration.  Wash your hands often and after each diarrhea episode. If soap and water are not available, use hand sanitizer. This information is not intended to replace advice given to you by your health care provider. Make sure you discuss any questions you have with your health care provider. Document Revised: 09/26/2018 Document Reviewed: 09/26/2018 Elsevier Patient Education  2021 Elsevier Inc.  

## 2020-09-04 NOTE — Progress Notes (Signed)
[facility]  ROUNDING NOTE   Subjective:  [name] is a 80 y.o.  male with essential hypertension and colon cancer recently diagnosed , who was admitted to [facility] on 08/29/2020 for generalized weakness.  Patient laying in bed Alert and oriented Daughter at bedside  Continues to complain of diarrhea Was able to eat half of all meals yesterday Breakfast tray at bedside, partially eaten   Objective:  Vital signs in last 24 hours:  Temp:  [98 F (36.7 C)-98.8 F (37.1 C)] 98.8 F (37.1 C) (05/25 0828) Pulse Rate:  [81-88] 87 (05/25 0828) Resp:  [14-18] 14 (05/25 0828) BP: (116-147)/(47-72) 138/72 (05/25 0828) SpO2:  [91 %-95 %] 94 % (05/25 0828)  Weight change:  Filed Weights   08/29/20 0125 08/30/20 1041  Weight: 63.5 kg 94.8 kg    Intake/Output: I/O last 3 completed shifts: In: 600 [P.O.:600] Out: 1950 [Urine:1475]   Intake/Output this shift:  No intake/output data recorded.  Physical Exam: General: NAD, resting in bed  Head: Normocephalic, atraumatic. Moist oral mucosal membranes  Eyes: Anicteric  Lungs:  Clear to auscultation, normal breathing effort  Heart: Regular rate and rhythm  Abdomen:  Soft, nontender  Extremities:  1+ peripheral edema.  Neurologic: Alert, oriented, moving all four extremities  Skin: No lesions  GU Foley    Basic Metabolic Panel: Recent Labs  Lab 08/29/20 0143 08/29/20 0914 08/29/20 1042 08/30/20 0104 08/31/20 0516 09/01/20 0442 09/01/20 1424 09/02/20 1336 09/02/20 2106 09/03/20 0545 09/03/20 1325 09/03/20 2107 09/04/20 0515  NA 122* 124*   < > 123* 123* 122*   < > 127* 129* 129* 128* 130* 130*  K 2.4* 3.2*   < > 3.4* 4.2 4.2  --  3.7  --  3.7  --   --  3.4*  CL 78* 81*   < > 83* 86* 85*  --  89*  --  93*  --   --  95*  CO2 31 31   < > 31 29 27   --  30  --  28  --   --  29  GLUCOSE 200* 202*   < > 209* 184* 204*  --  225*  --  173*  --   --  201*  BUN 16 15   < > 21 34* 45*  --  27*  --  26*  --    --  20  CREATININE 0.41* 0.49*   < > 0.62 1.12 1.40*  --  0.78  --  0.75  --   --  0.57*  CALCIUM 8.5* 8.3*   < > 8.1* 8.0* 8.0*  --  8.0*  --  7.6*  --   --  7.8*  MG 1.7  --   --  1.9  --   --   --   --   --  1.8  --   --   --   PHOS  --  2.4*  --  3.8  --   --   --   --   --  2.9  --   --   --    < > = values in this interval not displayed.    Liver Function Tests: Recent Labs  Lab 08/29/20 0143  AST 22  ALT 14  ALKPHOS 64  BILITOT 1.4*  PROT 6.3*  ALBUMIN 3.0*   No results for input(s): LIPASE, AMYLASE in the last 168 hours. No results for input(s): AMMONIA in the last 168  hours.  CBC: Recent Labs  Lab 08/30/20 0104 08/31/20 0516 09/01/20 0442 09/03/20 0545 09/04/20 0515  WBC 11.2* 14.4* 12.0* 6.8 6.7  NEUTROABS 9.2* 12.0* 9.7* 5.1 5.1  HGB 11.0* 11.0* 10.8* 9.2* 9.1*  HCT 32.5* 32.6* 31.6* 27.0* 27.1*  MCV 78.7* 79.5* 79.2* 80.6 81.4  PLT 229 253 270 216 197    Cardiac Enzymes: No results for input(s): CKTOTAL, CKMB, CKMBINDEX, TROPONINI in the last 168 hours.  BNP: Invalid input(s): POCBNP  CBG: Recent Labs  Lab 09/03/20 0805 09/03/20 1130 09/03/20 1702 09/03/20 2022 09/04/20 0808  GLUCAP 201* 192* 162* 193* 197*    Microbiology: Results for orders placed or performed during the hospital encounter of 08/29/20  Resp Panel by RT-PCR (Flu A&B, Covid) Nasopharyngeal Swab     Status: None   Collection Time: 08/29/20  4:47 AM   Specimen: Nasopharyngeal Swab; Nasopharyngeal(NP) swabs in vial transport medium  Result Value Ref Range Status   SARS Coronavirus 2 by RT PCR NEGATIVE NEGATIVE Final    Comment: (NOTE) SARS-CoV-2 target nucleic acids are NOT DETECTED.  The SARS-CoV-2 RNA is generally detectable in upper respiratory specimens during the acute phase of infection. The lowest concentration of SARS-CoV-2 viral copies this assay can detect is 138 copies/mL. A negative result does not preclude SARS-Cov-2 infection and should not be used as the  sole basis for treatment or other patient management decisions. A negative result may occur with  improper specimen collection/handling, submission of specimen other than nasopharyngeal swab, presence of viral mutation(s) within the areas targeted by this assay, and inadequate number of viral copies(<138 copies/mL). A negative result must be combined with clinical observations, patient history, and epidemiological information. The expected result is Negative.  Fact Sheet for Patients:  EntrepreneurPulse.com.au  Fact Sheet for Healthcare Providers:  IncredibleEmployment.be  This test is no t yet approved or cleared by the [place] FDA and  has been authorized for detection and/or diagnosis of SARS-CoV-2 by FDA under an Emergency Use Authorization (EUA). This EUA will remain  in effect (meaning this test can be used) for the duration of the COVID-19 declaration under Section 564(b)(1) of the Act, 21 U.S.C.section 360bbb-3(b)(1), unless the authorization is terminated  or revoked sooner.       Influenza A by PCR NEGATIVE NEGATIVE Final   Influenza B by PCR NEGATIVE NEGATIVE Final    Comment: (NOTE) The Xpert Xpress SARS-CoV-2/FLU/RSV plus assay is intended as an aid in the diagnosis of influenza from Nasopharyngeal swab specimens and should not be used as a sole basis for treatment. Nasal washings and aspirates are unacceptable for Xpert Xpress SARS-CoV-2/FLU/RSV testing.  Fact Sheet for Patients: EntrepreneurPulse.com.au  Fact Sheet for Healthcare Providers: IncredibleEmployment.be  This test is not yet approved or cleared by the [place] FDA and has been authorized for detection and/or diagnosis of SARS-CoV-2 by FDA under an Emergency Use Authorization (EUA). This EUA will remain in effect (meaning this test can be used) for the duration of the COVID-19 declaration under Section 564(b)(1) of the  Act, 21 U.S.C. section 360bbb-3(b)(1), unless the authorization is terminated or revoked.  Performed at [facility], [address] [place], [place] [address]   Gastrointestinal Panel by PCR , Stool     Status: None   Collection Time: 08/29/20 10:42 AM   Specimen: Stool  Result Value Ref Range Status   Campylobacter species NOT DETECTED NOT DETECTED Final   Plesimonas shigelloides NOT DETECTED NOT DETECTED Final   Salmonella species NOT  DETECTED NOT DETECTED Final   Yersinia enterocolitica NOT DETECTED NOT DETECTED Final   Vibrio species NOT DETECTED NOT DETECTED Final   Vibrio cholerae NOT DETECTED NOT DETECTED Final   Enteroaggregative E coli (EAEC) NOT DETECTED NOT DETECTED Final   Enteropathogenic E coli (EPEC) NOT DETECTED NOT DETECTED Final   Enterotoxigenic E coli (ETEC) NOT DETECTED NOT DETECTED Final   Shiga like toxin producing E coli (STEC) NOT DETECTED NOT DETECTED Final   Shigella/Enteroinvasive E coli (EIEC) NOT DETECTED NOT DETECTED Final   Cryptosporidium NOT DETECTED NOT DETECTED Final   Cyclospora cayetanensis NOT DETECTED NOT DETECTED Final   Entamoeba histolytica NOT DETECTED NOT DETECTED Final   Giardia lamblia NOT DETECTED NOT DETECTED Final   Adenovirus F40/41 NOT DETECTED NOT DETECTED Final   Astrovirus NOT DETECTED NOT DETECTED Final   Norovirus GI/GII NOT DETECTED NOT DETECTED Final   Rotavirus A NOT DETECTED NOT DETECTED Final   Sapovirus (I, II, IV, and V) NOT DETECTED NOT DETECTED Final    Comment: Performed at [facility], [address] [place], [place] [address]    Coagulation Studies: No results for input(s): LABPROT, INR in the last 72 hours.  Urinalysis: Recent Labs    09/01/20 1925  COLORURINE YELLOW*  LABSPEC 1.006  PHURINE 6.0  GLUCOSEU NEGATIVE  HGBUR LARGE*  BILIRUBINUR NEGATIVE  KETONESUR NEGATIVE  PROTEINUR NEGATIVE  NITRITE NEGATIVE  LEUKOCYTESUR LARGE*      Imaging: No results  found.   Medications:   . sodium chloride 75 mL/hr at 09/02/20 0011   . Chlorhexidine Gluconate Cloth  6 each Topical Daily  . enoxaparin (LOVENOX) injection  40 mg Subcutaneous Q24H  . feeding supplement  237 mL Oral TID BM  . insulin aspart  0-5 Units Subcutaneous QHS  . insulin aspart  0-9 Units Subcutaneous TID WC  . multivitamin with minerals  1 tablet Oral Daily  . nystatin   Topical BID  . tamsulosin  0.4 mg Oral Daily   acetaminophen **OR** acetaminophen, cyclobenzaprine, loperamide, magnesium hydroxide, promethazine, traZODone  Assessment/ Plan:  [name] is a 80 y.o.  male with essential hypertension and colon cancer recently diagnosed , who was admitted to [facility] on 08/29/2020 for generalized weakness.  1. Hyponatremia related to dehydration from diarrhea and poor intake Appears to have chronic hyponatremia with recent outpatient level of 133 May have some SIADH on top of dehydration, will monitor this Sodium improved to 130 Improved appetite Diarrhea appears to continue Continue IVF until discharge   2. Acute Kidney injury due to bilateral hydronephrosis - Urology suggest outpatient follow up for voiding trial.  - Flomax ordered   3. Anemia  - Latest result of 9.1 - Recently diagnosed with Colon Cancer Lab Results  Component Value Date   HGB 10.8 (L) 09/01/2020      LOS: 6   5/25/20229:28 AM

## 2020-09-05 DIAGNOSIS — E876 Hypokalemia: Secondary | ICD-10-CM | POA: Diagnosis not present

## 2020-09-05 DIAGNOSIS — R339 Retention of urine, unspecified: Secondary | ICD-10-CM | POA: Diagnosis not present

## 2020-09-05 DIAGNOSIS — C189 Malignant neoplasm of colon, unspecified: Secondary | ICD-10-CM | POA: Diagnosis not present

## 2020-09-05 DIAGNOSIS — M62838 Other muscle spasm: Secondary | ICD-10-CM | POA: Diagnosis not present

## 2020-09-05 DIAGNOSIS — M6281 Muscle weakness (generalized): Secondary | ICD-10-CM | POA: Diagnosis not present

## 2020-09-06 ENCOUNTER — Telehealth: Payer: Self-pay | Admitting: Urology

## 2020-09-06 NOTE — Telephone Encounter (Signed)
LMOM for pt to call office to schedule appt  voiding trial with me in 2 weeks. He should see me in the morning, and PA in the afternoon for PVR

## 2020-09-12 ENCOUNTER — Ambulatory Visit: Payer: PPO | Admitting: Urology

## 2020-09-12 ENCOUNTER — Ambulatory Visit: Payer: PPO | Admitting: Physician Assistant

## 2020-09-13 DIAGNOSIS — E876 Hypokalemia: Secondary | ICD-10-CM | POA: Diagnosis not present

## 2020-09-13 DIAGNOSIS — E871 Hypo-osmolality and hyponatremia: Secondary | ICD-10-CM | POA: Diagnosis not present

## 2020-09-13 DIAGNOSIS — C189 Malignant neoplasm of colon, unspecified: Secondary | ICD-10-CM | POA: Diagnosis not present

## 2020-09-16 DIAGNOSIS — N321 Vesicointestinal fistula: Secondary | ICD-10-CM | POA: Diagnosis not present

## 2020-09-16 DIAGNOSIS — K6389 Other specified diseases of intestine: Secondary | ICD-10-CM | POA: Diagnosis not present

## 2020-09-16 DIAGNOSIS — N1330 Unspecified hydronephrosis: Secondary | ICD-10-CM | POA: Diagnosis not present

## 2020-09-16 DIAGNOSIS — R5381 Other malaise: Secondary | ICD-10-CM | POA: Diagnosis not present

## 2020-09-16 NOTE — Progress Notes (Signed)
09/17/2020 12:56 PM   Keith Reynolds 27-Sep-1940 [id number]  Referring provider: Sofie Hartigan, MD [place],  [place] [address]  Chief Complaint  Patient presents with  . Urinary Retention    HPI: 80 year old male who presents today for follow-up of urinary retention.  He was recently admitted to [facility] found to have abdominal distention and a CT scan on 522 indicating acute urinary retention with 2 L of urine in his bladder which was milky in nature with associated bilateral hydronephrosis.  He was also noted to have acute kidney injury with a creatinine of 1.4 during retention which trended back to his previous baseline after Foley catheter placement.  Urinalysis showed greater than 50 RBCs without bacteria or blood.  No urine culture was sent.  He is not on antibiotics.   Foley catheter remains in place today.  Notably, he is recently been admitted with a 16 x 6 cm sigmoid colonic mass with partial obstruction and is saw care at [facility].  He was seen just yesterday to establish care.  He did not have access to the notes but the patient is accompanied today by his wife and daughter who reports that the oncologic surgeon does not feel that he is a surgical candidate at this time due to his overall weakness and would prefer that he improved clinically and prior to consideration of colectomy with and without what sounds like LAR.  During the admission, he was started on Flomax.  Prior to being admitted, he had no issues with voiding, emptying his bladder, weakness of stream, or any other issues.  Notably today, the family reports that he is unable to get out of bed to urinate.  He is overall generally weak.  Additionally, he was started on an aggressive stool softener which has resulted in now stool becoming visible via his Foley catheter starting about 3 days ago suggestive of colovesical fistula.  The family hoping that the Foley catheter would be maintained.  They  indicate that his surgical oncologist also has the same sentiment.    PMH: Past Medical History:  Diagnosis Date  . Acute urinary retention 09/04/2020  . Chronic venous stasis 05/28/2017  . Colon cancer [facility])    reported by patient and wife  . Diarrhea 09/04/2020  . Fracture    right forearm, tibia/fibula  . Gait instability 05/28/2017  . Heart murmur, systolic 3/35/4562  . Hernia, abdominal   . History of diabetes mellitus 05/28/2017  . Hypertension   . Hypokalemia 08/29/2020  . Hyponatremia 09/04/2020  . Malnutrition of moderate degree 08/30/2020  . Obesity (BMI 30-39.9) 05/28/2017  . Pressure injury of skin 08/30/2020    Surgical History: Past Surgical History:  Procedure Laterality Date  . COLONOSCOPY WITH PROPOFOL N/A 07/19/2020   Procedure: COLONOSCOPY WITH PROPOFOL;  Surgeon: Lesly Rubenstein, MD;  Location: [facility] ENDOSCOPY;  Service: Endoscopy;  Laterality: N/A;  C-19 07/18/2020 AM  . COLONOSCOPY WITH PROPOFOL N/A 07/22/2020   Procedure: COLONOSCOPY WITH PROPOFOL;  Surgeon: Lesly Rubenstein, MD;  Location: [facility] ENDOSCOPY;  Service: Endoscopy;  Laterality: N/A;  . EYE SURGERY    . FRACTURE SURGERY     right forearm, tibia/fibula  . HERNIA REPAIR    . TONSILLECTOMY    . VASECTOMY      Home Medications:  Allergies as of 09/17/2020      Reactions   Antihistamines, Diphenhydramine-type    Bactrim [sulfamethoxazole-trimethoprim] Nausea Only   Chamomile Other (See Comments)   Diphenhydramine  Hcl Other (See Comments)   dizziness   Gabapentin    Levomenol    Pseudoephedrine Hcl Other (See Comments)   dizziness   Rosuvastatin    Triprolidine Hcl Other (See Comments)   dizziness      Medication List       Accurate as of September 17, 2020 12:56 PM. If you have any questions, ask your nurse or doctor.        acetaminophen 325 MG tablet Commonly known as: TYLENOL Take 2 tablets (650 mg total) by mouth every 4 (four) hours as needed for mild pain or moderate pain (or Fever  >/= 101).   cyclobenzaprine 10 MG tablet Commonly known as: FLEXERIL Take 0.5 tablets (5 mg total) by mouth 3 (three) times daily as needed for muscle spasms.   loperamide 2 MG capsule Commonly known as: IMODIUM Take 1 capsule (2 mg total) by mouth every 4 (four) hours as needed for diarrhea or loose stools.   multivitamin with minerals Tabs tablet Take 1 tablet by mouth daily.   nystatin powder Commonly known as: MYCOSTATIN/NYSTOP Apply topically 2 (two) times daily.   promethazine 12.5 MG tablet Commonly known as: PHENERGAN Take 1 tablet (12.5 mg total) by mouth every 6 (six) hours as needed for nausea.   tamsulosin 0.4 MG Caps capsule Commonly known as: FLOMAX Take 1 capsule (0.4 mg total) by mouth daily.   traZODone 50 MG tablet Commonly known as: DESYREL Take 0.5 tablets (25 mg total) by mouth at bedtime as needed for sleep.       Allergies:  Allergies  Allergen Reactions  . Antihistamines, Diphenhydramine-Type   . Bactrim [Sulfamethoxazole-Trimethoprim] Nausea Only  . Chamomile Other (See Comments)  . Diphenhydramine Hcl Other (See Comments)    dizziness  . Gabapentin   . Levomenol   . Pseudoephedrine Hcl Other (See Comments)    dizziness  . Rosuvastatin   . Triprolidine Hcl Other (See Comments)    dizziness    Family History: Family History  Problem Relation Age of Onset  . Heart attack Father   . Diabetes Sister     Social History:  reports that he quit smoking about 37 years ago. He smoked 0.25 packs per day. He has never used smokeless tobacco. He reports that he does not drink alcohol and does not use drugs.   Physical Exam: BP 114/66   Pulse 93   Ht 5\' 7"  (1.702 m)   Wt 200 lb (90.7 kg)   BMI 31.32 kg/m   Constitutional:   No acute distress.  In wheelchair, alert but only answers some questions appropriately.  Family provides majority of the history today. HEENT: [place] AT, moist mucus membranes.  Trachea midline, no masses. Cardiovascular:  No clubbing, cyanosis, or edema. Respiratory: Normal respiratory effort, no increased work of breathing. Skin: No rashes, bruises or suspicious lesions. Neurologic: Grossly intact, no focal deficits, moving all 4 extremities. Psychiatric: Normal mood and affect.  Laboratory Data: Lab Results  Component Value Date   WBC 6.7 09/04/2020   HGB 9.1 (L) 09/04/2020   HCT 27.1 (L) 09/04/2020   MCV 81.4 09/04/2020   PLT 197 09/04/2020    Lab Results  Component Value Date   CREATININE 0.57 (L) 09/04/2020     Lab Results  Component Value Date   HGBA1C 7.1 (H) 09/01/2020    Urinalysis    Component Value Date/Time   COLORURINE YELLOW (A) 09/01/2020 1925   APPEARANCEUR CLOUDY (A) 09/01/2020 1925   LABSPEC 1.006  09/01/2020 1925   PHURINE 6.0 09/01/2020 1925   GLUCOSEU NEGATIVE 09/01/2020 1925   HGBUR LARGE (A) 09/01/2020 1925   BILIRUBINUR NEGATIVE 09/01/2020 1925   BILIRUBINUR neg 06/30/2017 [place] 09/01/2020 1925   PROTEINUR NEGATIVE 09/01/2020 1925   UROBILINOGEN 0.2 06/30/2017 1055   NITRITE NEGATIVE 09/01/2020 1925   LEUKOCYTESUR LARGE (A) 09/01/2020 1925    Lab Results  Component Value Date   BACTERIA RARE (A) 09/01/2020    Pertinent Imaging: US RENAL  Narrative CLINICAL DATA:  ATN  EXAM: RENAL / URINARY TRACT ULTRASOUND COMPLETE  COMPARISON:  CT 09/01/2020  FINDINGS: Right Kidney:  Renal measurements: 12.7 x 6.7 x 5.6 cm = volume: 252 mL. Moderate right hydronephrosis, similar to prior CT. Normal echotexture. No mass.  Left Kidney:  Renal measurements: 13.8 x 6.3 x 5.7 cm = volume: 260 mL. 12 mm medial midpole cyst. Mild left hydronephrosis, similar to prior study.  Bladder:  Layering debris within the bladder.  No bladder wall thickening.  Other:  None.  IMPRESSION: Bilateral hydronephrosis, right greater than left, similar to prior CT.  Layering debris within the bladder.   Electronically Signed By: Rolm Baptise  M.D. On: 09/01/2020 15:24  Renal ultrasound along with CT scan were both personally reviewed today.  Agree with above radiologic interpretation.  Assessment & Plan:    1. Acute urinary retention Etiology likely multifactorial including overall mobility/loss of function, acute illness, and possibly involving his pelvic process leading to massive urinary retention with bilateral hydroureteronephrosis  Foley catheter remains in place today and initially voiding trial had been discussed but in light of overall general medical condition and comorbidities, after lengthy discussion about the risk and benefits, the patient and his family have decided to leave the Foley catheter in place at least for the time being  Orders for catheter exchange were sent to [facility] to be done on a monthly basis, may consider upsizing to 59 [place] if becomes occluded especially in light of probable colovesical fistula and flush as needed.  If the patient regains his functional status and strength, will reconsider voiding trial at that point.  The patient will keep [place] updated about if and when he is discharged from [facility] we can make the appropriate follow-up.  Finally, we discussed stopping Flomax today.  Given that he is planning on maintaining a Foley catheter, there is no indication for Flomax at this time and this may be contributing to his fatigue.  If he does end up undergoing a voiding trial in the future, we will plan to restart this medication about 3 days prior to voiding trial.  2. Bilateral hydronephrosis Repeat imaging was not obtained but his creatinine did return back to his baseline upon Foley catheter placement suggestive this was secondary to urinary outlet obstruction  In light of other comorbidities, hold off on repeating imaging as he would likely be reimaged in the near future in light of his additional comorbidities  3. Acute kidney injury ([place]) Resolved after Foley catheter  placement, likely secondary to urinary obstruction  4. Colovesical fistula Feculent urine over the past 3 days suggestive of malignant colovesical fistula  We had a lengthy discussion today that if and when he is deemed a candidate for surgical repair, this will also be repaired.  In the interim, have to manage his catheter ensure that it continues to drain.  Additionally, he will be chronically colonized with bacteria in his urine and will be difficult to know  if and when he has urinary tract infection.  We discussed indications for checking urine and urine culture and when to treat with antibiotics i.e. when he develops signs or symptoms of systemic infection.  Patient is fairly agreeable this plan.   Return for call for follow up when discharge from home.  Hollice Espy, MD  [facility] [address], [place], [place] [address] [phone]  I spent 60 total minutes on the day of the encounter including pre-visit review of the medical record, face-to-face time with the patient, and post visit ordering of labs/imaging/tests.  The majority of the time was spent in chart review both via [place] and review of recent labs and imaging related to his most recent admission.  Did also spend a good amount of time making arrangements/orders with [facility] today.

## 2020-09-17 ENCOUNTER — Encounter: Payer: Self-pay | Admitting: Urology

## 2020-09-17 ENCOUNTER — Ambulatory Visit: Payer: PPO | Admitting: Physician Assistant

## 2020-09-17 ENCOUNTER — Ambulatory Visit: Payer: PPO | Admitting: Urology

## 2020-09-17 ENCOUNTER — Other Ambulatory Visit: Payer: Self-pay

## 2020-09-17 VITALS — BP 114/66 | HR 93 | Ht 67.0 in | Wt 200.0 lb

## 2020-09-17 DIAGNOSIS — N179 Acute kidney failure, unspecified: Secondary | ICD-10-CM

## 2020-09-17 DIAGNOSIS — C189 Malignant neoplasm of colon, unspecified: Secondary | ICD-10-CM | POA: Diagnosis not present

## 2020-09-17 DIAGNOSIS — R338 Other retention of urine: Secondary | ICD-10-CM | POA: Diagnosis not present

## 2020-09-17 DIAGNOSIS — R339 Retention of urine, unspecified: Secondary | ICD-10-CM | POA: Diagnosis not present

## 2020-09-17 DIAGNOSIS — N1330 Unspecified hydronephrosis: Secondary | ICD-10-CM | POA: Diagnosis not present

## 2020-09-17 DIAGNOSIS — N321 Vesicointestinal fistula: Secondary | ICD-10-CM

## 2020-09-17 DIAGNOSIS — E876 Hypokalemia: Secondary | ICD-10-CM | POA: Diagnosis not present

## 2020-09-17 DIAGNOSIS — R8281 Pyuria: Secondary | ICD-10-CM

## 2020-09-17 DIAGNOSIS — E871 Hypo-osmolality and hyponatremia: Secondary | ICD-10-CM | POA: Diagnosis not present

## 2020-09-17 NOTE — Patient Instructions (Signed)
Orders given for [facility] to exchange Foley monthly

## 2020-09-20 ENCOUNTER — Inpatient Hospital Stay
Admission: EM | Admit: 2020-09-20 | Discharge: 2020-09-27 | DRG: 871 | Disposition: A | Discharge status: Skilled Nursing Facility | Payer: PPO | Source: Skilled Nursing Facility | Attending: Internal Medicine | Admitting: Internal Medicine

## 2020-09-20 ENCOUNTER — Emergency Department: Payer: PPO

## 2020-09-20 ENCOUNTER — Other Ambulatory Visit: Payer: Self-pay

## 2020-09-20 ENCOUNTER — Encounter: Payer: Self-pay | Admitting: Radiology

## 2020-09-20 DIAGNOSIS — J9811 Atelectasis: Secondary | ICD-10-CM | POA: Diagnosis not present

## 2020-09-20 DIAGNOSIS — I2699 Other pulmonary embolism without acute cor pulmonale: Secondary | ICD-10-CM | POA: Diagnosis not present

## 2020-09-20 DIAGNOSIS — Z7189 Other specified counseling: Secondary | ICD-10-CM | POA: Diagnosis not present

## 2020-09-20 DIAGNOSIS — I491 Atrial premature depolarization: Secondary | ICD-10-CM | POA: Diagnosis not present

## 2020-09-20 DIAGNOSIS — Z888 Allergy status to other drugs, medicaments and biological substances status: Secondary | ICD-10-CM

## 2020-09-20 DIAGNOSIS — R55 Syncope and collapse: Secondary | ICD-10-CM | POA: Diagnosis not present

## 2020-09-20 DIAGNOSIS — I878 Other specified disorders of veins: Secondary | ICD-10-CM | POA: Diagnosis present

## 2020-09-20 DIAGNOSIS — Z8249 Family history of ischemic heart disease and other diseases of the circulatory system: Secondary | ICD-10-CM

## 2020-09-20 DIAGNOSIS — I82412 Acute embolism and thrombosis of left femoral vein: Secondary | ICD-10-CM | POA: Diagnosis present

## 2020-09-20 DIAGNOSIS — R600 Localized edema: Secondary | ICD-10-CM | POA: Diagnosis not present

## 2020-09-20 DIAGNOSIS — Z7401 Bed confinement status: Secondary | ICD-10-CM | POA: Diagnosis not present

## 2020-09-20 DIAGNOSIS — E119 Type 2 diabetes mellitus without complications: Secondary | ICD-10-CM | POA: Diagnosis present

## 2020-09-20 DIAGNOSIS — Z882 Allergy status to sulfonamides status: Secondary | ICD-10-CM

## 2020-09-20 DIAGNOSIS — L89153 Pressure ulcer of sacral region, stage 3: Secondary | ICD-10-CM | POA: Diagnosis not present

## 2020-09-20 DIAGNOSIS — J189 Pneumonia, unspecified organism: Secondary | ICD-10-CM

## 2020-09-20 DIAGNOSIS — J90 Pleural effusion, not elsewhere classified: Secondary | ICD-10-CM | POA: Diagnosis not present

## 2020-09-20 DIAGNOSIS — Z515 Encounter for palliative care: Secondary | ICD-10-CM | POA: Diagnosis not present

## 2020-09-20 DIAGNOSIS — E871 Hypo-osmolality and hyponatremia: Secondary | ICD-10-CM | POA: Diagnosis not present

## 2020-09-20 DIAGNOSIS — I700 Atherosclerosis of aorta: Secondary | ICD-10-CM | POA: Diagnosis present

## 2020-09-20 DIAGNOSIS — R54 Age-related physical debility: Secondary | ICD-10-CM | POA: Diagnosis present

## 2020-09-20 DIAGNOSIS — R197 Diarrhea, unspecified: Secondary | ICD-10-CM | POA: Diagnosis present

## 2020-09-20 DIAGNOSIS — Z66 Do not resuscitate: Secondary | ICD-10-CM | POA: Diagnosis not present

## 2020-09-20 DIAGNOSIS — M50221 Other cervical disc displacement at C4-C5 level: Secondary | ICD-10-CM | POA: Diagnosis not present

## 2020-09-20 DIAGNOSIS — Z20822 Contact with and (suspected) exposure to covid-19: Secondary | ICD-10-CM | POA: Diagnosis not present

## 2020-09-20 DIAGNOSIS — N321 Vesicointestinal fistula: Secondary | ICD-10-CM | POA: Diagnosis present

## 2020-09-20 DIAGNOSIS — J9601 Acute respiratory failure with hypoxia: Secondary | ICD-10-CM | POA: Diagnosis present

## 2020-09-20 DIAGNOSIS — Z978 Presence of other specified devices: Secondary | ICD-10-CM

## 2020-09-20 DIAGNOSIS — C189 Malignant neoplasm of colon, unspecified: Secondary | ICD-10-CM | POA: Diagnosis not present

## 2020-09-20 DIAGNOSIS — R609 Edema, unspecified: Secondary | ICD-10-CM

## 2020-09-20 DIAGNOSIS — N3289 Other specified disorders of bladder: Secondary | ICD-10-CM | POA: Diagnosis not present

## 2020-09-20 DIAGNOSIS — C7931 Secondary malignant neoplasm of brain: Secondary | ICD-10-CM | POA: Diagnosis not present

## 2020-09-20 DIAGNOSIS — Z833 Family history of diabetes mellitus: Secondary | ICD-10-CM

## 2020-09-20 DIAGNOSIS — W19XXXA Unspecified fall, initial encounter: Secondary | ICD-10-CM | POA: Diagnosis not present

## 2020-09-20 DIAGNOSIS — M5021 Other cervical disc displacement,  high cervical region: Secondary | ICD-10-CM | POA: Diagnosis not present

## 2020-09-20 DIAGNOSIS — L89313 Pressure ulcer of right buttock, stage 3: Secondary | ICD-10-CM | POA: Diagnosis present

## 2020-09-20 DIAGNOSIS — R159 Full incontinence of feces: Secondary | ICD-10-CM | POA: Diagnosis present

## 2020-09-20 DIAGNOSIS — Z6831 Body mass index (BMI) 31.0-31.9, adult: Secondary | ICD-10-CM

## 2020-09-20 DIAGNOSIS — I6389 Other cerebral infarction: Secondary | ICD-10-CM | POA: Diagnosis not present

## 2020-09-20 DIAGNOSIS — I10 Essential (primary) hypertension: Secondary | ICD-10-CM | POA: Diagnosis present

## 2020-09-20 DIAGNOSIS — M2550 Pain in unspecified joint: Secondary | ICD-10-CM | POA: Diagnosis not present

## 2020-09-20 DIAGNOSIS — M4802 Spinal stenosis, cervical region: Secondary | ICD-10-CM | POA: Diagnosis not present

## 2020-09-20 DIAGNOSIS — N5089 Other specified disorders of the male genital organs: Secondary | ICD-10-CM | POA: Diagnosis not present

## 2020-09-20 DIAGNOSIS — N400 Enlarged prostate without lower urinary tract symptoms: Secondary | ICD-10-CM | POA: Diagnosis not present

## 2020-09-20 DIAGNOSIS — D1800 Hemangioma unspecified site: Secondary | ICD-10-CM | POA: Diagnosis not present

## 2020-09-20 DIAGNOSIS — A419 Sepsis, unspecified organism: Principal | ICD-10-CM

## 2020-09-20 DIAGNOSIS — R0689 Other abnormalities of breathing: Secondary | ICD-10-CM | POA: Diagnosis not present

## 2020-09-20 DIAGNOSIS — C19 Malignant neoplasm of rectosigmoid junction: Secondary | ICD-10-CM | POA: Diagnosis not present

## 2020-09-20 DIAGNOSIS — I639 Cerebral infarction, unspecified: Secondary | ICD-10-CM | POA: Diagnosis not present

## 2020-09-20 DIAGNOSIS — R188 Other ascites: Secondary | ICD-10-CM

## 2020-09-20 DIAGNOSIS — H543 Unqualified visual loss, both eyes: Secondary | ICD-10-CM | POA: Diagnosis not present

## 2020-09-20 DIAGNOSIS — G959 Disease of spinal cord, unspecified: Secondary | ICD-10-CM | POA: Diagnosis not present

## 2020-09-20 DIAGNOSIS — I2510 Atherosclerotic heart disease of native coronary artery without angina pectoris: Secondary | ICD-10-CM | POA: Diagnosis present

## 2020-09-20 DIAGNOSIS — E440 Moderate protein-calorie malnutrition: Secondary | ICD-10-CM | POA: Diagnosis not present

## 2020-09-20 DIAGNOSIS — K6389 Other specified diseases of intestine: Secondary | ICD-10-CM | POA: Diagnosis not present

## 2020-09-20 DIAGNOSIS — Z9852 Vasectomy status: Secondary | ICD-10-CM

## 2020-09-20 DIAGNOSIS — M47812 Spondylosis without myelopathy or radiculopathy, cervical region: Secondary | ICD-10-CM | POA: Diagnosis not present

## 2020-09-20 DIAGNOSIS — N1330 Unspecified hydronephrosis: Secondary | ICD-10-CM | POA: Diagnosis not present

## 2020-09-20 DIAGNOSIS — R0902 Hypoxemia: Secondary | ICD-10-CM | POA: Diagnosis not present

## 2020-09-20 DIAGNOSIS — Z79899 Other long term (current) drug therapy: Secondary | ICD-10-CM

## 2020-09-20 DIAGNOSIS — R000 Tachycardia, unspecified: Secondary | ICD-10-CM | POA: Diagnosis not present

## 2020-09-20 DIAGNOSIS — R0602 Shortness of breath: Secondary | ICD-10-CM | POA: Diagnosis not present

## 2020-09-20 DIAGNOSIS — I82442 Acute embolism and thrombosis of left tibial vein: Secondary | ICD-10-CM | POA: Diagnosis not present

## 2020-09-20 DIAGNOSIS — Z86711 Personal history of pulmonary embolism: Secondary | ICD-10-CM | POA: Diagnosis not present

## 2020-09-20 DIAGNOSIS — I959 Hypotension, unspecified: Secondary | ICD-10-CM | POA: Diagnosis not present

## 2020-09-20 DIAGNOSIS — N136 Pyonephrosis: Secondary | ICD-10-CM | POA: Diagnosis present

## 2020-09-20 DIAGNOSIS — R404 Transient alteration of awareness: Secondary | ICD-10-CM | POA: Diagnosis not present

## 2020-09-20 DIAGNOSIS — Z87891 Personal history of nicotine dependence: Secondary | ICD-10-CM

## 2020-09-20 LAB — CBC WITH DIFFERENTIAL/PLATELET
Abs Immature Granulocytes: 0.27 10*3/uL — ABNORMAL HIGH (ref 0.00–0.07)
Basophils Absolute: 0.1 10*3/uL (ref 0.0–0.1)
Basophils Relative: 0 %
Eosinophils Absolute: 0 10*3/uL (ref 0.0–0.5)
Eosinophils Relative: 0 %
HCT: 28.7 % — ABNORMAL LOW (ref 39.0–52.0)
Hemoglobin: 9.6 g/dL — ABNORMAL LOW (ref 13.0–17.0)
Immature Granulocytes: 2 %
Lymphocytes Relative: 6 %
Lymphs Abs: 1 10*3/uL (ref 0.7–4.0)
MCH: 27.7 pg (ref 26.0–34.0)
MCHC: 33.4 g/dL (ref 30.0–36.0)
MCV: 82.9 fL (ref 80.0–100.0)
Monocytes Absolute: 1.5 10*3/uL — ABNORMAL HIGH (ref 0.1–1.0)
Monocytes Relative: 9 %
Neutro Abs: 14.2 10*3/uL — ABNORMAL HIGH (ref 1.7–7.7)
Neutrophils Relative %: 83 %
Platelets: 210 10*3/uL (ref 150–400)
RBC: 3.46 MIL/uL — ABNORMAL LOW (ref 4.22–5.81)
RDW: 17.2 % — ABNORMAL HIGH (ref 11.5–15.5)
WBC: 17.1 10*3/uL — ABNORMAL HIGH (ref 4.0–10.5)
nRBC: 0 % (ref 0.0–0.2)

## 2020-09-20 LAB — APTT: aPTT: 39 seconds — ABNORMAL HIGH (ref 24–36)

## 2020-09-20 LAB — BRAIN NATRIURETIC PEPTIDE: B Natriuretic Peptide: 158.8 pg/mL — ABNORMAL HIGH (ref 0.0–100.0)

## 2020-09-20 LAB — TROPONIN I (HIGH SENSITIVITY)
Troponin I (High Sensitivity): 9 ng/L (ref <18)
Troponin I (High Sensitivity): 7 ng/L (ref <18)

## 2020-09-20 LAB — COMPREHENSIVE METABOLIC PANEL
ALT: 9 U/L (ref 0–44)
AST: 15 U/L (ref 15–41)
Albumin: 2.1 g/dL — ABNORMAL LOW (ref 3.5–5.0)
Alkaline Phosphatase: 96 U/L (ref 38–126)
Anion gap: 7 (ref 5–15)
BUN: 29 mg/dL — ABNORMAL HIGH (ref 8–23)
CO2: 27 mmol/L (ref 22–32)
Calcium: 7.6 mg/dL — ABNORMAL LOW (ref 8.9–10.3)
Chloride: 88 mmol/L — ABNORMAL LOW (ref 98–111)
Creatinine, Ser: 0.47 mg/dL — ABNORMAL LOW (ref 0.61–1.24)
GFR, Estimated: >60 mL/min (ref >60)
Glucose, Bld: 172 mg/dL — ABNORMAL HIGH (ref 70–99)
Potassium: 3.9 mmol/L (ref 3.5–5.1)
Sodium: 122 mmol/L — ABNORMAL LOW (ref 135–145)
Total Bilirubin: 1.4 mg/dL — ABNORMAL HIGH (ref 0.3–1.2)
Total Protein: 5 g/dL — ABNORMAL LOW (ref 6.5–8.1)

## 2020-09-20 LAB — RESP PANEL BY RT-PCR (FLU A&B, COVID) ARPGX2
Influenza A by PCR: NEGATIVE
Influenza B by PCR: NEGATIVE
SARS Coronavirus 2 by RT PCR: NEGATIVE

## 2020-09-20 LAB — LACTIC ACID, PLASMA
Lactic Acid, Venous: 1 mmol/L (ref 0.5–1.9)
Lactic Acid, Venous: 1 mmol/L (ref 0.5–1.9)

## 2020-09-20 LAB — PROTIME-INR
INR: 1.5 — ABNORMAL HIGH (ref 0.8–1.2)
Prothrombin Time: 17.7 seconds — ABNORMAL HIGH (ref 11.4–15.2)

## 2020-09-20 IMAGING — CT CT ABD-PELV W/ CM
2 of 5 series · 14 of 46 positions shown, 16 images · IV contrast (omnipaque)
Comparison: [DATE]
COMPARISON: [DATE]

Addendum:
CLINICAL DATA: Lethargic. Fever. Abdominal abscess/infection
expected

EXAM:
CT ABDOMEN AND PELVIS WITH CONTRAST
TECHNIQUE: Multidetector CT imaging of the abdomen and pelvis was performed
using the standard protocol following bolus administration of
intravenous contrast.
CONTRAST:  100mL OMNIPAQUE IOHEXOL 350 MG/ML SOLN

[Series 2: axial st · axial · 0.98mm/px · z∈[-693,-213]mm · 11 of 109 slices shown, 13 images]
[im 7/109  soft-tissue]
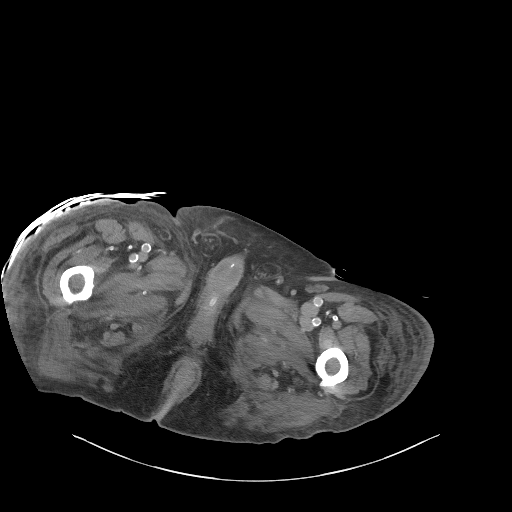
[im 7/109  bone]
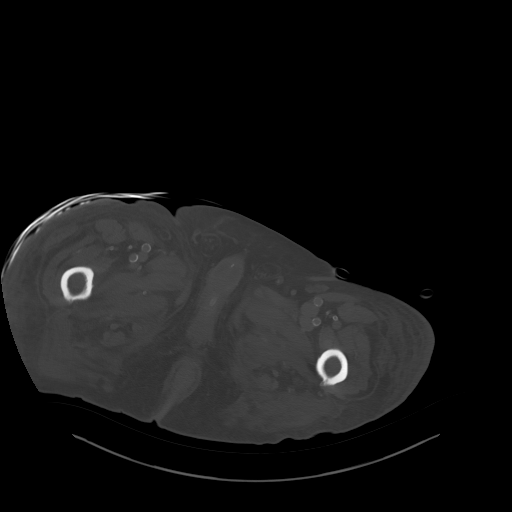
[im 19/109  soft-tissue]
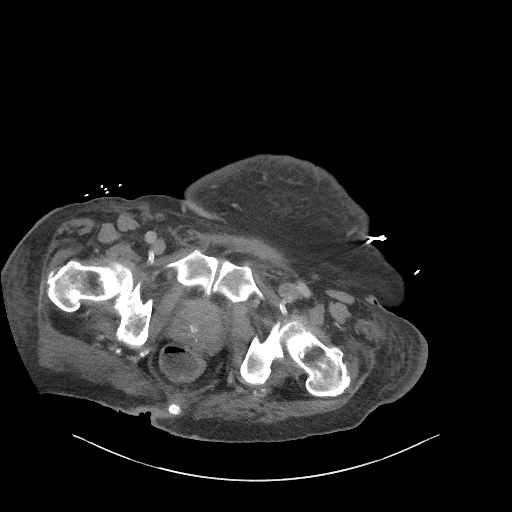
[im 25/109  soft-tissue]
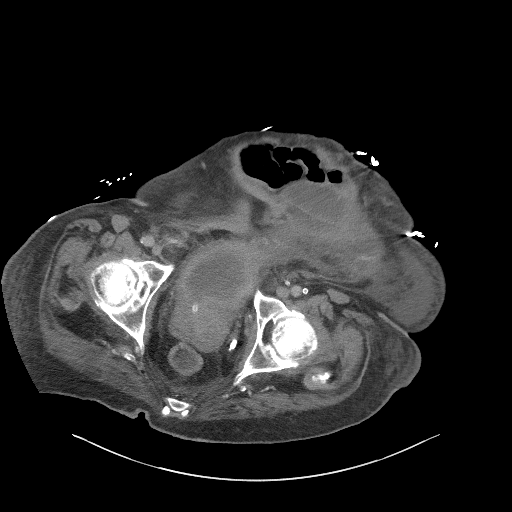
[im 37/109  soft-tissue]
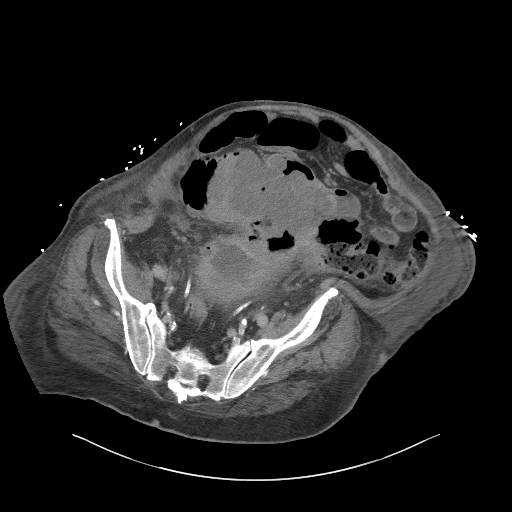
[im 43/109  soft-tissue]
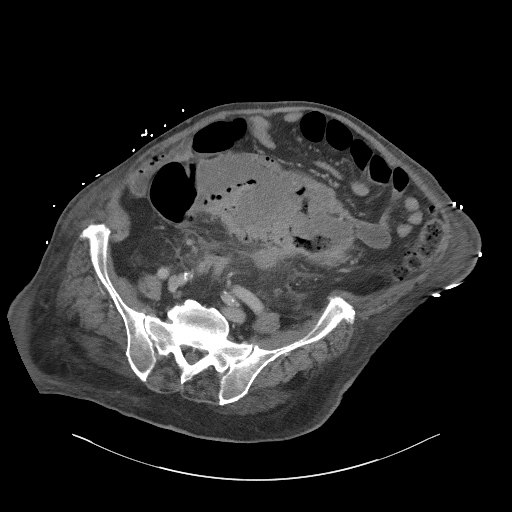
[im 55/109  soft-tissue]
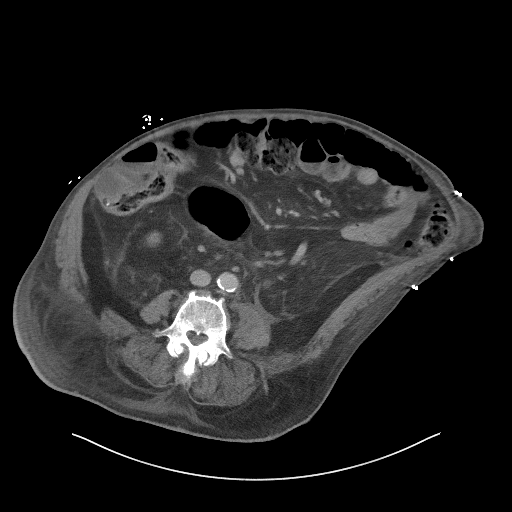
[im 67/109  soft-tissue]
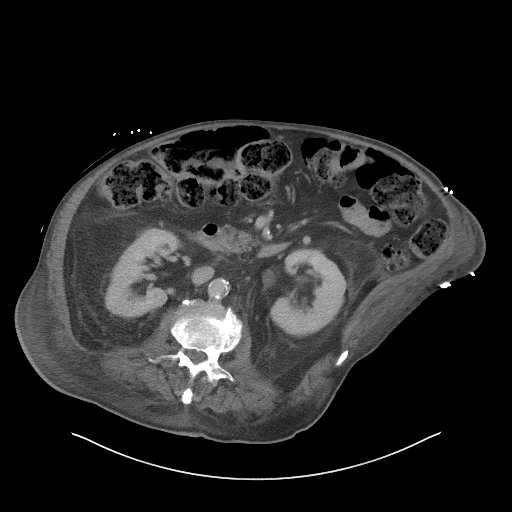
[im 73/109  soft-tissue]
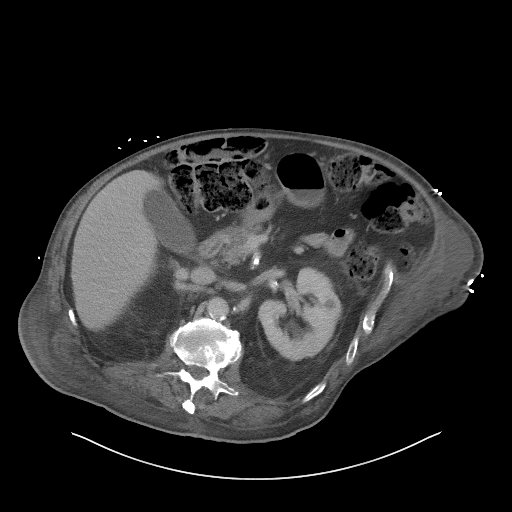
[im 85/109  soft-tissue]
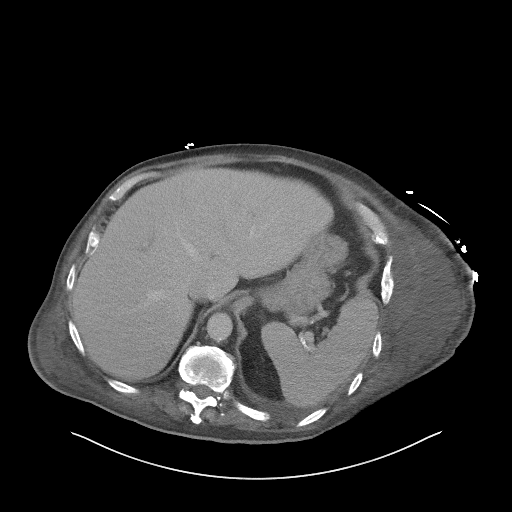
[im 85/109  bone]
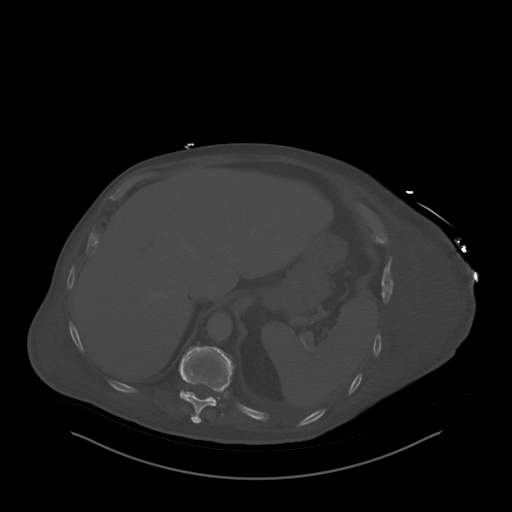
[im 91/109  soft-tissue]
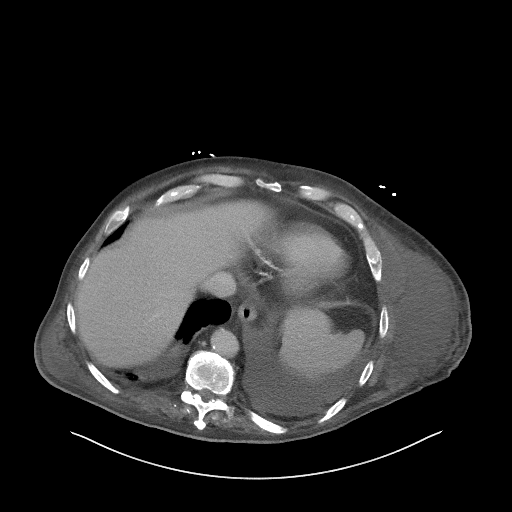
[im 103/109  soft-tissue]
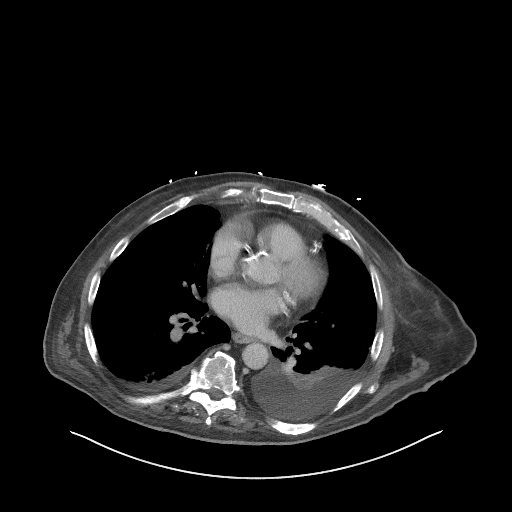

[Series 5: coronal st · coronal · 0.87mm/px · 3 of 115 slices shown]
[im 39/115  soft-tissue]
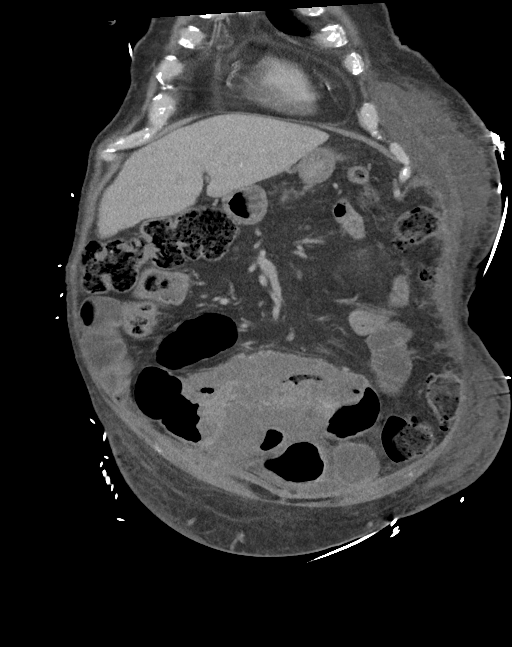
[im 51/115  soft-tissue]
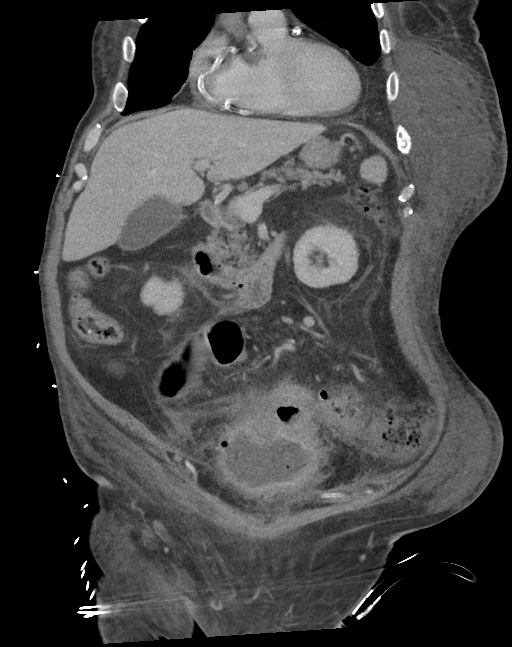
[im 64/115  soft-tissue]
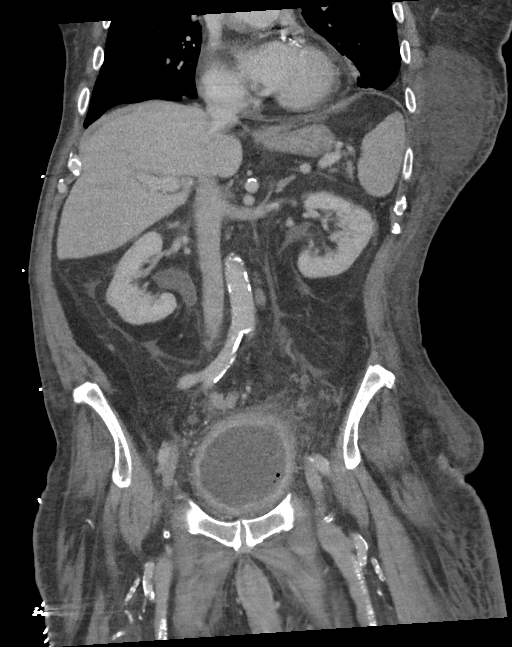

[14 of 46 positions shown; findings below may reference images not displayed]

FINDINGS: Lower chest: Bilateral pleural effusions, left larger than right.
Compressive atelectasis in the lung bases. Heart is borderline in
size. Diffuse coronary artery calcifications and distal thoracic
aortic calcifications.

Hepatobiliary: No focal hepatic abnormality. Gallbladder
unremarkable.

Pancreas: No focal abnormality or ductal dilatation.

Spleen: No focal abnormality.  Normal size.

Adrenals/Urinary Tract: Foley catheter within the bladder. Bladder
wall is irregular and thickened. There appears to be mucosal
enhancement. Appearance is concerning for possible cystitis. Large
anterior bladder wall diverticulum noted. Mild bilateral
hydronephrosis, right greater than left, slightly improved since
prior study.

Stomach/Bowel: Complex irregular mass again seen in the proximal to
mid sigmoid colon, unchanged since recent study compatible with. No
bowel obstruction. Stomach and small bowel decompressed, grossly
unremarkable. Colonic malignancy

Vascular/Lymphatic: Aortic atherosclerosis. No evidence of aneurysm
or adenopathy.

Reproductive: Prostate enlargement

Other: No free fluid or free air.

Musculoskeletal: No acute bony abnormality. Mild compression
fractures through the inferior L3 vertebral body is similar to prior
study.
IMPRESSION: Large complex sigmoid colonic mass again noted compatible with colon
cancer. No evidence of bowel obstruction.

Mild bladder wall thickening with apparent mucosal enhancement
concerning for possible cystitis. Recommend clinical correlation.
Foley catheter in place. Large anterior bladder wall diverticulum.

Mild bilateral hydronephrosis, right greater than left, slightly
improved since prior study.

Prostate enlargement.

Bilateral pleural effusions, left greater than right. Compressive
atelectasis in the left lower lobe.

Diffuse coronary artery disease.  Aortic atherosclerosis.

ADDENDUM:
After discussing the case with Dr. REGILANE, there is clinical
concern for possible colovesical fistula. The abnormal bladder wall
thickening is most pronounced superiorly where the bladder wall is
inseparable from the large sigmoid colonic mass. Given the history,
colovesical fistula is likely.

*** End of Addendum ***
FINDINGS: Lower chest: Bilateral pleural effusions, left larger than right.
Compressive atelectasis in the lung bases. Heart is borderline in
size. Diffuse coronary artery calcifications and distal thoracic
aortic calcifications.

Hepatobiliary: No focal hepatic abnormality. Gallbladder
unremarkable.

Pancreas: No focal abnormality or ductal dilatation.

Spleen: No focal abnormality.  Normal size.

Adrenals/Urinary Tract: Foley catheter within the bladder. Bladder
wall is irregular and thickened. There appears to be mucosal
enhancement. Appearance is concerning for possible cystitis. Large
anterior bladder wall diverticulum noted. Mild bilateral
hydronephrosis, right greater than left, slightly improved since
prior study.

Stomach/Bowel: Complex irregular mass again seen in the proximal to
mid sigmoid colon, unchanged since recent study compatible with. No
bowel obstruction. Stomach and small bowel decompressed, grossly
unremarkable. Colonic malignancy

Vascular/Lymphatic: Aortic atherosclerosis. No evidence of aneurysm
or adenopathy.

Reproductive: Prostate enlargement

Other: No free fluid or free air.

Musculoskeletal: No acute bony abnormality. Mild compression
fractures through the inferior L3 vertebral body is similar to prior
study.
IMPRESSION: Large complex sigmoid colonic mass again noted compatible with colon
cancer. No evidence of bowel obstruction.

Mild bladder wall thickening with apparent mucosal enhancement
concerning for possible cystitis. Recommend clinical correlation.
Foley catheter in place. Large anterior bladder wall diverticulum.

Mild bilateral hydronephrosis, right greater than left, slightly
improved since prior study.

Prostate enlargement.

Bilateral pleural effusions, left greater than right. Compressive
atelectasis in the left lower lobe.

Diffuse coronary artery disease.  Aortic atherosclerosis.

## 2020-09-20 IMAGING — DX DG CHEST 1V PORT
1 series · 1 of 1 positions shown · non-contrast
Comparison: None.

CLINICAL DATA: Lethargy and possible sepsis, initial encounter

EXAM:
PORTABLE CHEST 1 VIEW

[chest ap]
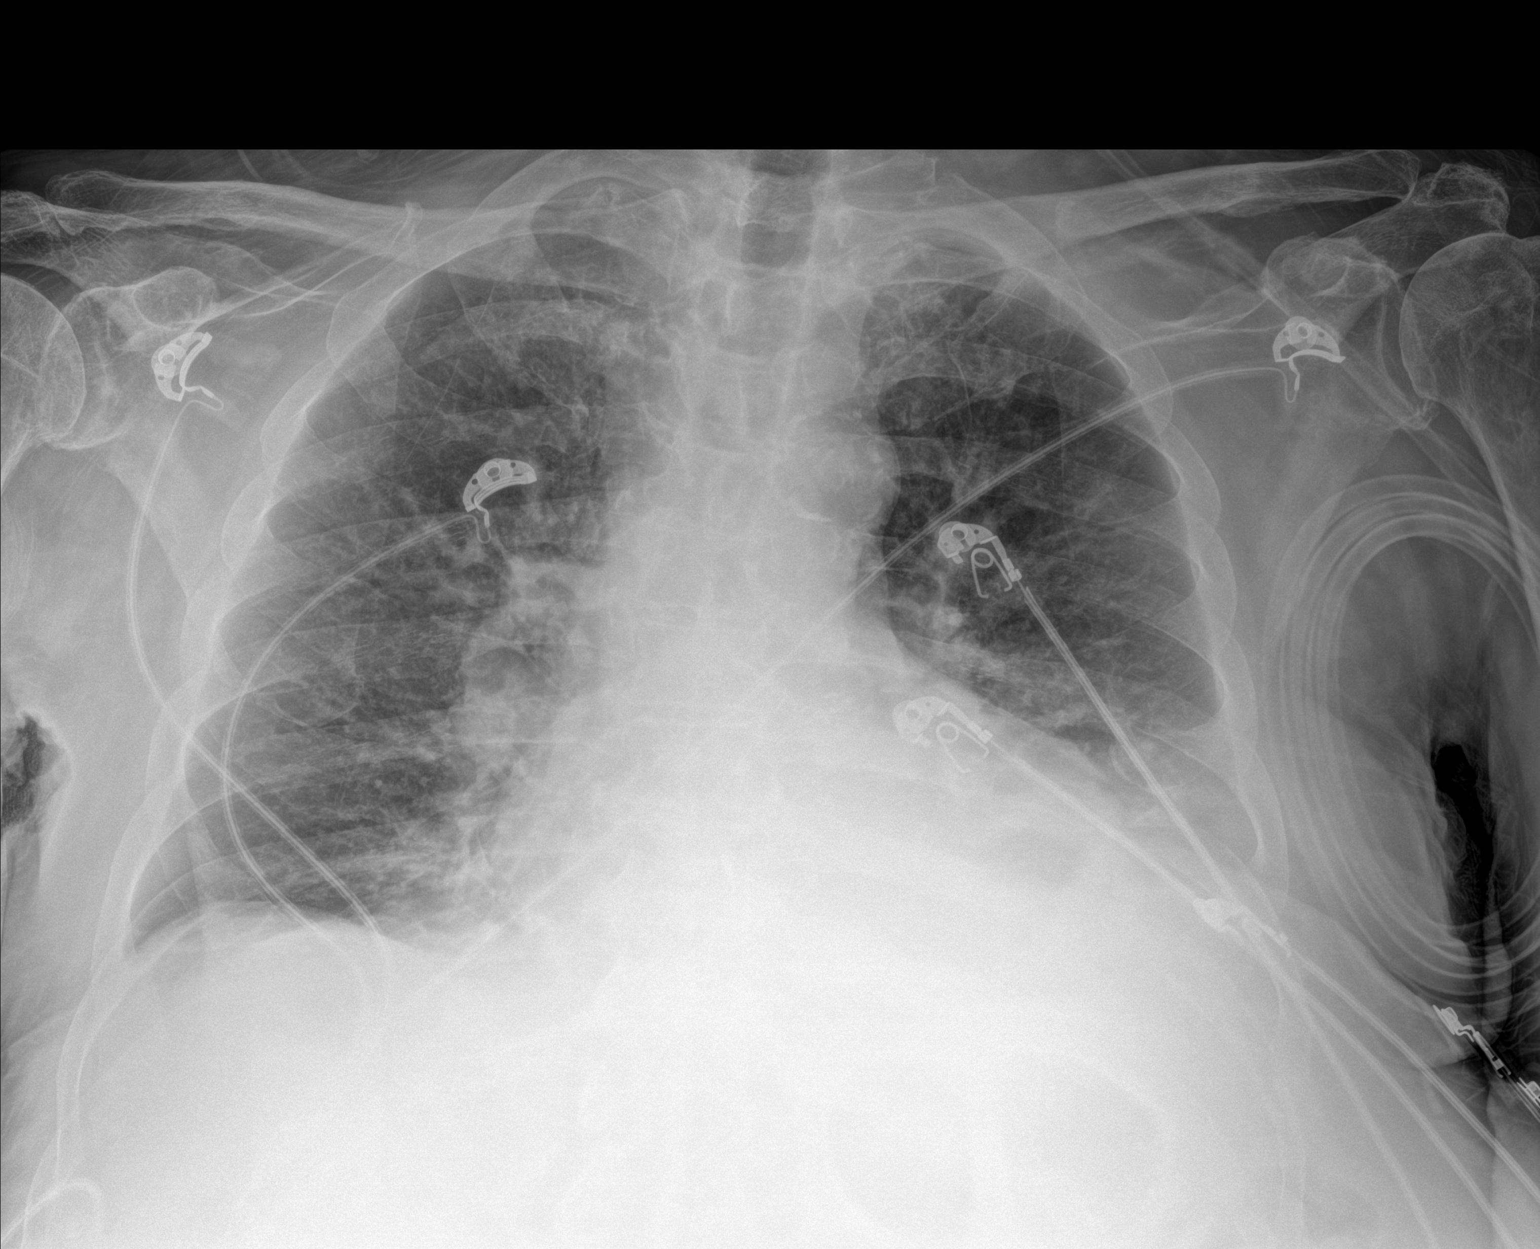

[1 of 1 positions shown; findings below may reference images not displayed]

FINDINGS: Cardiac shadow is enlarged. Aortic calcifications are noted.
Left-sided pleural effusion and underlying atelectasis/infiltrate is
seen. Right lung is clear. Mild central vascular congestion is
noted. No bony abnormality is noted.
IMPRESSION: Left-sided effusion with underlying atelectasis/infiltrate.

Mild vascular congestion.

## 2020-09-20 IMAGING — CT CT ANGIO CHEST
2 of 7 series · 18 of 46 positions shown · IV contrast (APPLIED)
Comparison: None.

CLINICAL DATA: Lethargic.  Low O2 sats.

EXAM:
CT ANGIOGRAPHY CHEST WITH CONTRAST
TECHNIQUE: Multidetector CT imaging of the chest was performed using the
standard protocol during bolus administration of intravenous
contrast. Multiplanar CT image reconstructions and MIPs were
obtained to evaluate the vascular anatomy.
CONTRAST:  100mL OMNIPAQUE IOHEXOL 350 MG/ML SOLN

[Series 5: thins · axial · 0.74mm/px · z∈[-321,-49]mm · 15 of 305 slices shown]
[im 17/305  lung]
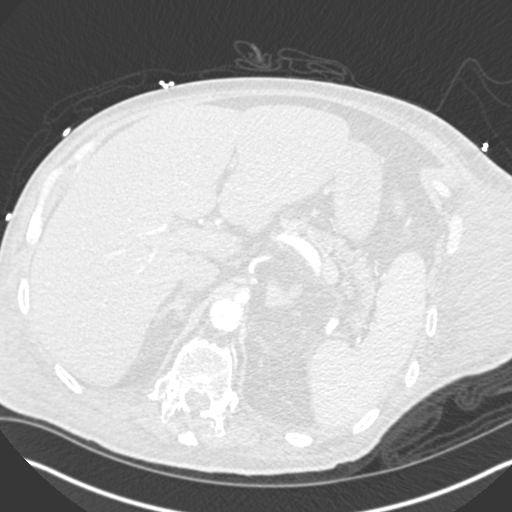
[im 33/305  soft-tissue]
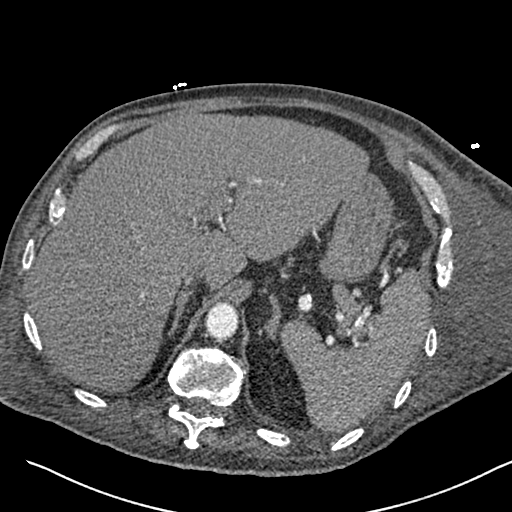
[im 65/305  lung]
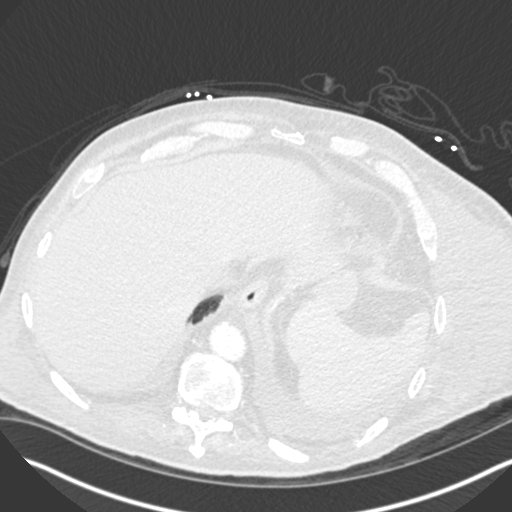
[im 81/305  soft-tissue]
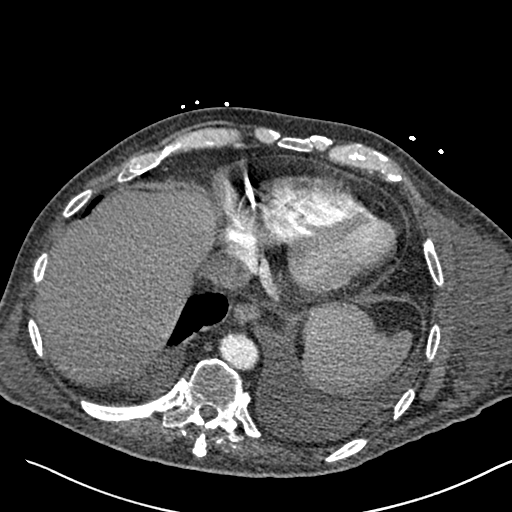
[im 97/305  lung]
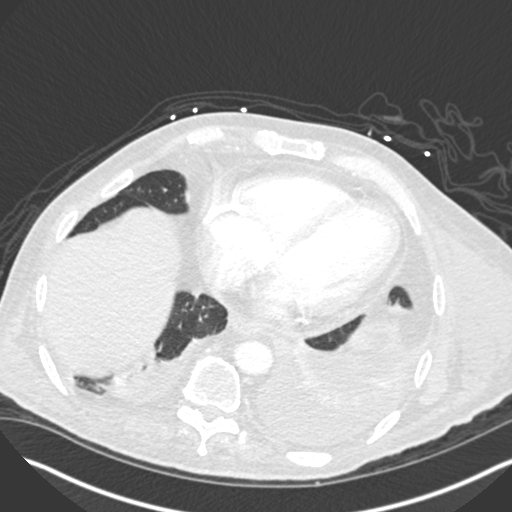
[im 113/305  soft-tissue]
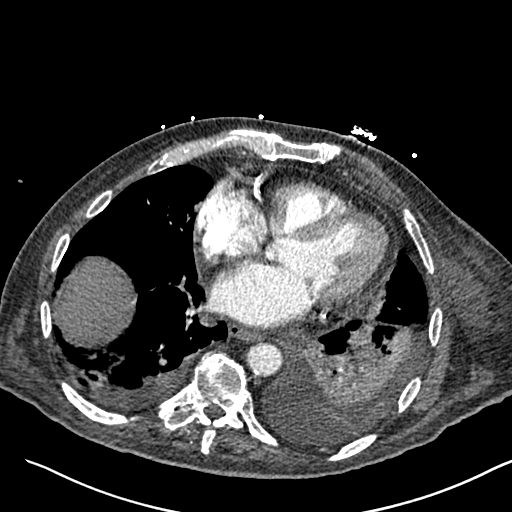
[im 129/305  lung]
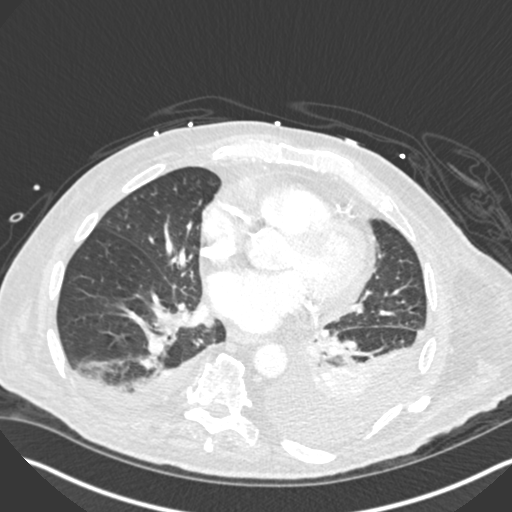
[im 161/305  soft-tissue]
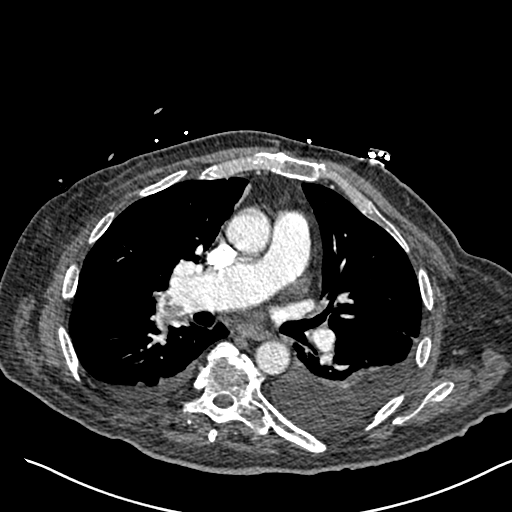
[im 177/305  lung]
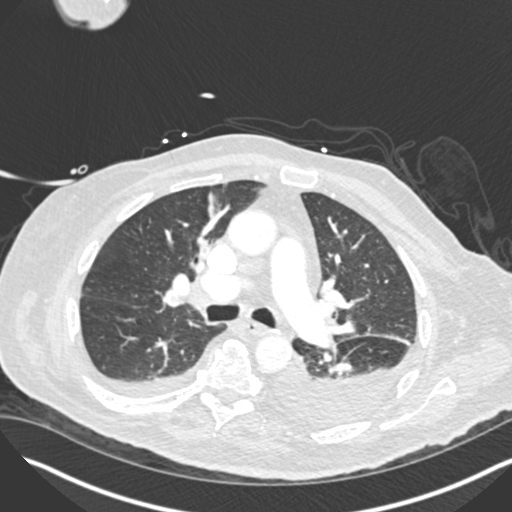
[im 193/305  soft-tissue]
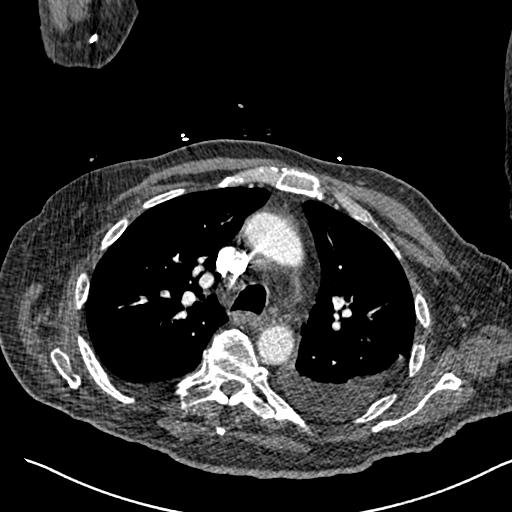
[im 209/305  lung]
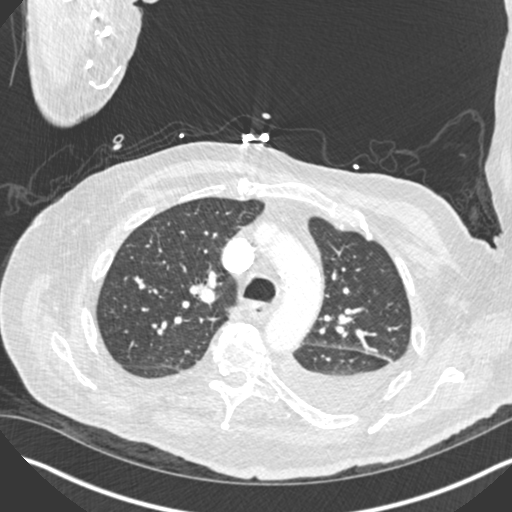
[im 225/305  soft-tissue]
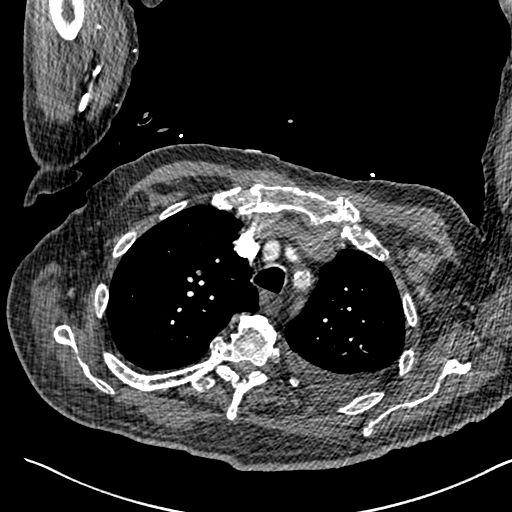
[im 257/305  lung]
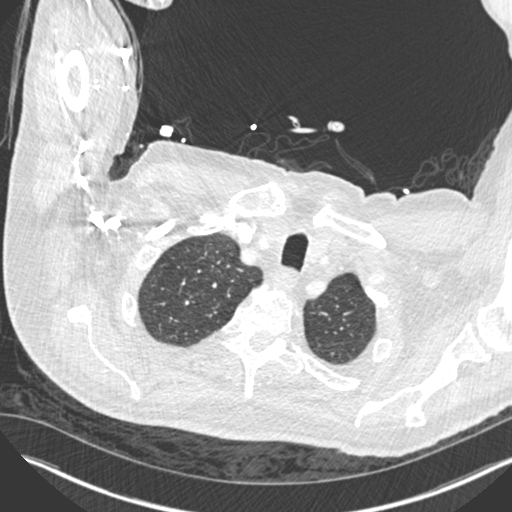
[im 273/305  soft-tissue]
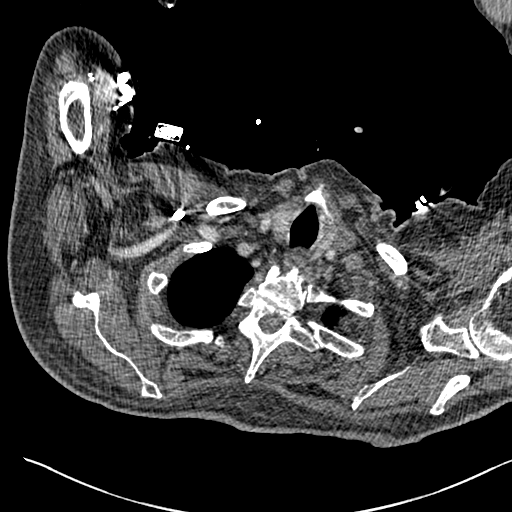
[im 289/305  lung]
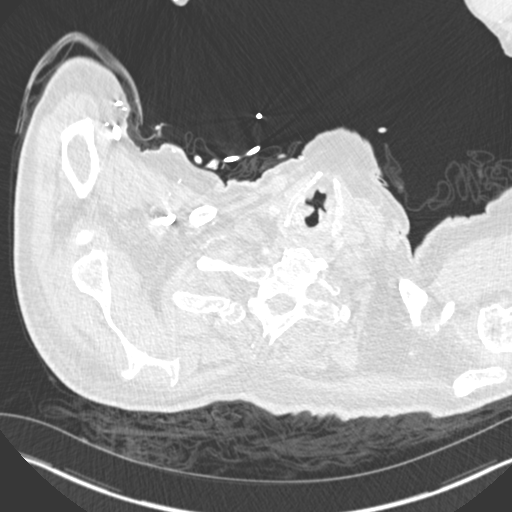

[Series 7: coronal mpr · coronal · 0.65mm/px · 3 of 127 slices shown]
[im 32/127  soft-tissue]
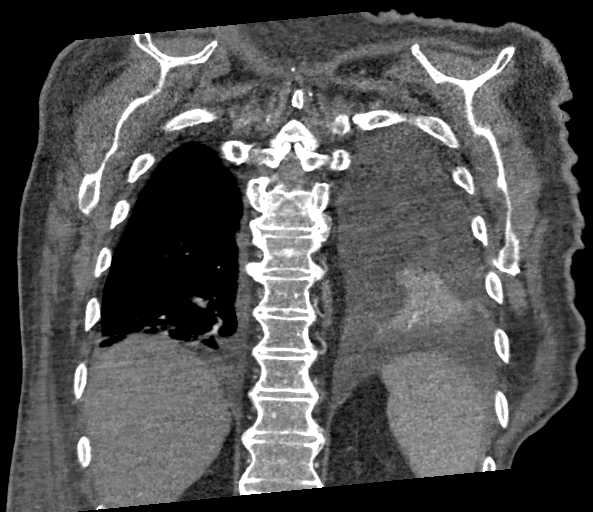
[im 64/127  soft-tissue]
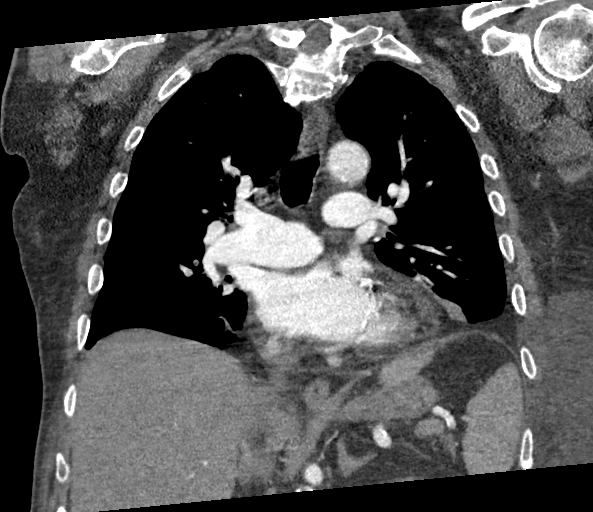
[im 95/127  soft-tissue]
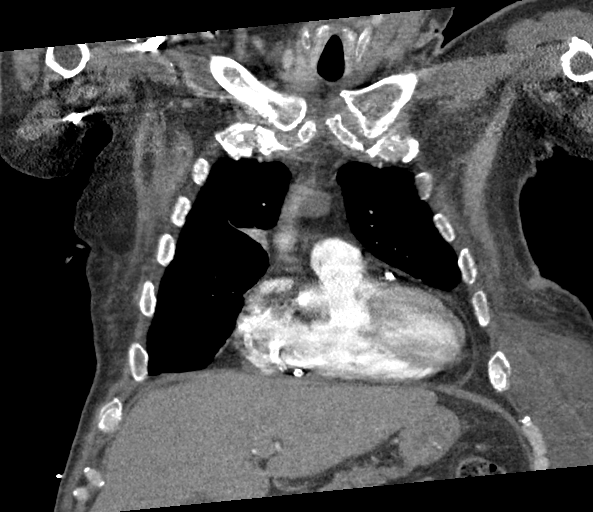

[18 of 46 positions shown; findings below may reference images not displayed]

FINDINGS: Cardiovascular: Pulmonary emboli are seen in the right upper and
lower lobe pulmonary arterial branches and also in the left upper
lobe. No evidence of right heart strain. Diffuse coronary artery
calcifications and moderate aortic calcifications. Tortuous aorta.
No aneurysm.

Mediastinum/Nodes: No mediastinal, hilar, or axillary adenopathy.
Trachea and esophagus are unremarkable. Thyroid unremarkable.

Lungs/Pleura: Moderate left pleural effusion and small right pleural
effusion. Compressive atelectasis in the left lower lobe. Minimal
right base atelectasis.

Upper Abdomen: Imaging into the upper abdomen demonstrates no acute
findings.

Musculoskeletal: Abnormal soft tissue noted in the left lateral
chest wall measuring up to 12.5 cm in AP dimension on image 80 of
series 4. This may reflect large left lateral chest wall hematoma.
This conceivably also could reflect asymmetric edema/anasarca as
similar finding was seen on prior abdominal CT from [DATE].No
acute bony abnormality.

Review of the MIP images confirms the above findings.
IMPRESSION: Pulmonary emboli bilaterally, most notable in the right lung
particularly right lower lobe. No evidence of right heart strain.

Moderate left pleural effusion and small right pleural effusion.
Compressive atelectasis in the left lower lobe.

Diffuse coronary artery calcifications.

Aortic Atherosclerosis ([YC]-[YC]).

Critical Value/emergent results were called by telephone at the time
of interpretation on [DATE] at [DATE] to provider ERICKJOSE ,
who verbally acknowledged these results.

## 2020-09-20 MED ORDER — LACTATED RINGERS IV BOLUS (SEPSIS)
1000.0000 mL | Freq: Once | INTRAVENOUS | Status: AC
  Administered 2020-09-20: 1000 mL via INTRAVENOUS

## 2020-09-20 MED ORDER — VANCOMYCIN HCL IN DEXTROSE 1-5 GM/200ML-% IV SOLN
1000.0000 mg | Freq: Once | INTRAVENOUS | Status: AC
  Administered 2020-09-20: 1000 mg via INTRAVENOUS
  Filled 2020-09-20: qty 200

## 2020-09-20 MED ORDER — HYDROCODONE-ACETAMINOPHEN 5-325 MG PO TABS
1.0000 | ORAL_TABLET | ORAL | Status: DC | PRN
Start: 2020-09-20 — End: 2020-09-27
  Administered 2020-09-22: 2 via ORAL
  Filled 2020-09-20: qty 2

## 2020-09-20 MED ORDER — LACTATED RINGERS IV SOLN: INTRAVENOUS | Status: AC

## 2020-09-20 MED ORDER — ONDANSETRON HCL 4 MG PO TABS: 4.0000 mg | ORAL_TABLET | Freq: Four times a day (QID) | ORAL | Status: DC | PRN

## 2020-09-20 MED ORDER — SODIUM CHLORIDE 0.9 % IV SOLN
2.0000 g | Freq: Once | INTRAVENOUS | Status: AC
  Administered 2020-09-20: 2 g via INTRAVENOUS
  Filled 2020-09-20: qty 2

## 2020-09-20 MED ORDER — ACETAMINOPHEN 325 MG PO TABS: 650.0000 mg | ORAL_TABLET | Freq: Four times a day (QID) | ORAL | Status: DC | PRN

## 2020-09-20 MED ORDER — ACETAMINOPHEN 650 MG RE SUPP: 650.0000 mg | Freq: Four times a day (QID) | RECTAL | Status: DC | PRN

## 2020-09-20 MED ORDER — HEPARIN BOLUS VIA INFUSION
2500.0000 [IU] | Freq: Once | INTRAVENOUS | Status: AC
  Administered 2020-09-20: 2500 [IU] via INTRAVENOUS
  Filled 2020-09-20: qty 2500

## 2020-09-20 MED ORDER — ONDANSETRON HCL 4 MG/2ML IJ SOLN: 4.0000 mg | Freq: Four times a day (QID) | INTRAMUSCULAR | Status: DC | PRN

## 2020-09-20 MED ORDER — METRONIDAZOLE 500 MG/100ML IV SOLN
500.0000 mg | Freq: Once | INTRAVENOUS | Status: AC
  Administered 2020-09-20: 500 mg via INTRAVENOUS
  Filled 2020-09-20: qty 100

## 2020-09-20 MED ORDER — IOHEXOL 350 MG/ML SOLN
100.0000 mL | Freq: Once | INTRAVENOUS | Status: AC | PRN
  Administered 2020-09-20: 100 mL via INTRAVENOUS

## 2020-09-20 MED ORDER — IOHEXOL 300 MG/ML  SOLN: 100.0000 mL | Freq: Once | INTRAMUSCULAR | Status: DC | PRN

## 2020-09-20 MED ORDER — HEPARIN (PORCINE) 25000 UT/250ML-% IV SOLN
2700.0000 [IU]/h | INTRAVENOUS | Status: DC
  Administered 2020-09-20: 1350 [IU]/h via INTRAVENOUS
  Administered 2020-09-22: 1650 [IU]/h via INTRAVENOUS
  Administered 2020-09-22: 1950 [IU]/h via INTRAVENOUS
  Administered 2020-09-23: 2250 [IU]/h via INTRAVENOUS
  Administered 2020-09-23: 2550 [IU]/h via INTRAVENOUS
  Administered 2020-09-24 – 2020-09-25 (×3): 2700 [IU]/h via INTRAVENOUS
  Filled 2020-09-20 (×11): qty 250

## 2020-09-20 NOTE — H&P (Signed)
History and Physical    Keith Reynolds JSH:[id number] DOB: 20-Feb-1941 DOA: 09/20/2020  PCP: Sofie Hartigan, MD   Patient coming from: snf  I have personally briefly reviewed patient's old medical records in [place]  Chief Complaint: Lethargy  HPI: Keith Reynolds is a 80 y.o. male with medical history significant for DM, HTN, recently diagnosed colorectal cancer, with known colovesical fistula with indwelling Foley, recently hospitalized from 5/19-5/25 for persistent diarrhea and severe hyponatremia, improved and discharged to SNF who was in his usual state of health until the morning of admission when he had an outpatient doctor's appointment however by the afternoon he was noted to be lethargic at the facility, febrile at 101.2 with O2 sats 80% on room air.  Requiring NRB for transport with EMS.  Patient has been having continued watery diarrhea and poor appetite but no vomiting.  He has suprapubic abdominal pain with mild to moderate intensity, nonradiating.  Has some cough and shortness of breath but no chest pain.  He has generalized weakness. ED course: On arrival, tachycardic at 113, tachypneic at 22 with O2 sat of 95 on nonrebreather.  Afebrile with BP 117/60 Blood work significant for leukocytosis of 17,000, hemoglobin 9.6.  Sodium 122.  Creatinine 0.47.  Troponin 9-7, lactic acid 1-1, BNP 158 EKG, personally viewed and interpreted: Sinus tach at 108 with nonspecific ST-T wave changes Imaging: CTA chest with bilateral pulmonary emboli without evidence of right heart strain, moderate left pleural effusion among other findings CT abdomen and pelvis with large complex sigmoid colonic mass compatible with colon cancer without evidence of bowel obstruction, colovesical fistula, mild bilateral hydronephrosis, improved from prior study among other findings  Patient started on IV heparin.  Also started on sepsis protocol.  ED provider had a conversation with family about goals  of care  Patient is DNR .  Review of Systems: As per HPI otherwise all other systems on review of systems negative.    Past Medical History:  Diagnosis Date   Acute urinary retention 09/04/2020   Chronic venous stasis 05/28/2017   Colon cancer ([place])    reported by patient and wife   Diarrhea 09/04/2020   Fracture    right forearm, tibia/fibula   Gait instability 05/28/2017   Heart murmur, systolic 8/50/2774   Hernia, abdominal    History of diabetes mellitus 05/28/2017   Hypertension    Hypokalemia 08/29/2020   Hyponatremia 09/04/2020   Malnutrition of moderate degree 08/30/2020   Obesity (BMI 30-39.9) 05/28/2017   Pressure injury of skin 08/30/2020    Past Surgical History:  Procedure Laterality Date   COLONOSCOPY WITH PROPOFOL N/A 07/19/2020   Procedure: COLONOSCOPY WITH PROPOFOL;  Surgeon: Lesly Rubenstein, MD;  Location: [facility] ENDOSCOPY;  Service: Endoscopy;  Laterality: N/A;  C-19 07/18/2020 AM   COLONOSCOPY WITH PROPOFOL N/A 07/22/2020   Procedure: COLONOSCOPY WITH PROPOFOL;  Surgeon: Lesly Rubenstein, MD;  Location: [facility] ENDOSCOPY;  Service: Endoscopy;  Laterality: N/A;   EYE SURGERY     FRACTURE SURGERY     right forearm, tibia/fibula   HERNIA REPAIR     TONSILLECTOMY     VASECTOMY       reports that he quit smoking about 37 years ago. He smoked an average of 0.25 packs per day. He has never used smokeless tobacco. He reports that he does not drink alcohol and does not use drugs.  Allergies  Allergen Reactions   Antihistamines, Diphenhydramine-Type    Bactrim [Sulfamethoxazole-Trimethoprim] Nausea Only  Chamomile Other (See Comments)   Diphenhydramine Hcl Other (See Comments)    dizziness   Gabapentin    Levomenol    Pseudoephedrine Hcl Other (See Comments)    dizziness   Rosuvastatin    Triprolidine Hcl Other (See Comments)    dizziness    Family History  Problem Relation Age of Onset   Heart attack Father    Diabetes Sister       Prior to Admission  medications   Medication Sig Start Date End Date Taking? Authorizing Provider  acetaminophen (TYLENOL) 325 MG tablet Take 2 tablets (650 mg total) by mouth every 4 (four) hours as needed for mild pain or moderate pain (or Fever >/= 101). 09/04/20   Dwyane Dee, MD  cyclobenzaprine (FLEXERIL) 10 MG tablet Take 0.5 tablets (5 mg total) by mouth 3 (three) times daily as needed for muscle spasms. 09/04/20   Dwyane Dee, MD  loperamide (IMODIUM) 2 MG capsule Take 1 capsule (2 mg total) by mouth every 4 (four) hours as needed for diarrhea or loose stools. 09/04/20   Dwyane Dee, MD  Multiple Vitamin (MULTIVITAMIN WITH MINERALS) TABS tablet Take 1 tablet by mouth daily. 09/05/20   Dwyane Dee, MD  nystatin (MYCOSTATIN/NYSTOP) powder Apply topically 2 (two) times daily. 09/04/20   Dwyane Dee, MD  promethazine (PHENERGAN) 12.5 MG tablet Take 1 tablet (12.5 mg total) by mouth every 6 (six) hours as needed for nausea. 09/04/20   Dwyane Dee, MD  tamsulosin (FLOMAX) 0.4 MG CAPS capsule Take 1 capsule (0.4 mg total) by mouth daily. 09/05/20   Dwyane Dee, MD  traZODone (DESYREL) 50 MG tablet Take 0.5 tablets (25 mg total) by mouth at bedtime as needed for sleep. 09/04/20   Dwyane Dee, MD    Physical Exam: Vitals:   09/20/20 1930 09/20/20 1940 09/20/20 2000 09/20/20 2030  BP: (!) 117/59  (!) 122/54 (!) 112/53  Pulse: (!) 108 (!) 108 (!) 103 (!) 104  Resp: (!) 30 (!) 26 (!) 27 (!) 28  Temp:      TempSrc:      SpO2: 90% 92% 92% 92%  Weight:      Height:         Vitals:   09/20/20 1930 09/20/20 1940 09/20/20 2000 09/20/20 2030  BP: (!) 117/59  (!) 122/54 (!) 112/53  Pulse: (!) 108 (!) 108 (!) 103 (!) 104  Resp: (!) 30 (!) 26 (!) 27 (!) 28  Temp:      TempSrc:      SpO2: 90% 92% 92% 92%  Weight:      Height:          Constitutional: Frail and ill-appearing.  Oriented x3.  In moderate respiratory distress, abdominal breathing, speaking in short sentences HEENT:      Head:  Normocephalic and atraumatic.         Eyes: PERLA, EOMI, Conjunctivae are normal. Sclera is non-icteric.       Mouth/Throat: Mucous membranes are moist.       Neck: Supple with no signs of meningismus. Cardiovascular: Tachycardic. No murmurs, gallops, or rubs. 2+ symmetrical distal pulses are present . No JVD. No LE edema Respiratory: Respiratory effort increased.Lungs sounds c with scattered wheezes, crackles, no rhonchi.  Gastrointestinal: Soft, distended, tenderness on palpation in the right suprapubic area.   Genitourinary: No CVA tenderness. Musculoskeletal: Nontender with normal range of motion in all extremities. No cyanosis, or erythema of extremities. Neurologic:  Face is symmetric. Moving all extremities. No gross focal  neurologic deficits . Skin: Skin is warm, dry.  No rash or ulcers Psychiatric: Mood and affect are normal for situation    Labs on Admission: I have personally reviewed following labs and imaging studies  CBC: Recent Labs  Lab 09/20/20 1710  WBC 17.1*  NEUTROABS 14.2*  HGB 9.6*  HCT 28.7*  MCV 82.9  PLT 673   Basic Metabolic Panel: Recent Labs  Lab 09/20/20 1710  NA 122*  K 3.9  CL 88*  CO2 27  GLUCOSE 172*  BUN 29*  CREATININE 0.47*  CALCIUM 7.6*   GFR: Estimated Creatinine Clearance: 79.1 mL/min (A) (by C-G formula based on SCr of 0.47 mg/dL (L)). Liver Function Tests: Recent Labs  Lab 09/20/20 1710  AST 15  ALT 9  ALKPHOS 96  BILITOT 1.4*  PROT 5.0*  ALBUMIN 2.1*   No results for input(s): LIPASE, AMYLASE in the last 168 hours. No results for input(s): AMMONIA in the last 168 hours. Coagulation Profile: Recent Labs  Lab 09/20/20 1710  INR 1.5*   Cardiac Enzymes: No results for input(s): CKTOTAL, CKMB, CKMBINDEX, TROPONINI in the last 168 hours. BNP (last 3 results) No results for input(s): PROBNP in the last 8760 hours. HbA1C: No results for input(s): HGBA1C in the last 72 hours. CBG: No results for input(s): GLUCAP in  the last 168 hours. Lipid Profile: No results for input(s): CHOL, HDL, LDLCALC, TRIG, CHOLHDL, LDLDIRECT in the last 72 hours. Thyroid Function Tests: No results for input(s): TSH, T4TOTAL, FREET4, T3FREE, THYROIDAB in the last 72 hours. Anemia Panel: No results for input(s): VITAMINB12, FOLATE, FERRITIN, TIBC, IRON, RETICCTPCT in the last 72 hours. Urine analysis:    Component Value Date/Time   COLORURINE YELLOW (A) 09/01/2020 1925   APPEARANCEUR CLOUDY (A) 09/01/2020 1925   LABSPEC 1.006 09/01/2020 1925   PHURINE 6.0 09/01/2020 1925   GLUCOSEU NEGATIVE 09/01/2020 1925   HGBUR LARGE (A) 09/01/2020 1925   BILIRUBINUR NEGATIVE 09/01/2020 1925   BILIRUBINUR neg 06/30/2017 1055   KETONESUR NEGATIVE 09/01/2020 1925   PROTEINUR NEGATIVE 09/01/2020 1925   UROBILINOGEN 0.2 06/30/2017 1055   NITRITE NEGATIVE 09/01/2020 1925   LEUKOCYTESUR LARGE (A) 09/01/2020 1925    Radiological Exams on Admission: CT Angio Chest PE W/Cm &/Or Wo Cm  Result Date: 09/20/2020 CLINICAL DATA:  Lethargic.  Low O2 sats. EXAM: CT ANGIOGRAPHY CHEST WITH CONTRAST TECHNIQUE: Multidetector CT imaging of the chest was performed using the standard protocol during bolus administration of intravenous contrast. Multiplanar CT image reconstructions and MIPs were obtained to evaluate the vascular anatomy. CONTRAST:  136mL OMNIPAQUE IOHEXOL 350 MG/ML SOLN COMPARISON:  None. FINDINGS: Cardiovascular: Pulmonary emboli are seen in the right upper and lower lobe pulmonary arterial branches and also in the left upper lobe. No evidence of right heart strain. Diffuse coronary artery calcifications and moderate aortic calcifications. Tortuous aorta. No aneurysm. Mediastinum/Nodes: No mediastinal, hilar, or axillary adenopathy. Trachea and esophagus are unremarkable. Thyroid unremarkable. Lungs/Pleura: Moderate left pleural effusion and small right pleural effusion. Compressive atelectasis in the left lower lobe. Minimal right base  atelectasis. Upper Abdomen: Imaging into the upper abdomen demonstrates no acute findings. Musculoskeletal: Abnormal soft tissue noted in the left lateral chest wall measuring up to 12.5 cm in AP dimension on image 80 of series 4. This may reflect large left lateral chest wall hematoma. This conceivably also could reflect asymmetric edema/anasarca as similar finding was seen on prior abdominal CT from 09/01/2020.No acute bony abnormality. Review of the MIP images confirms the above findings.  IMPRESSION: Pulmonary emboli bilaterally, most notable in the right lung particularly right lower lobe. No evidence of right heart strain. Moderate left pleural effusion and small right pleural effusion. Compressive atelectasis in the left lower lobe. Diffuse coronary artery calcifications. Aortic Atherosclerosis (ICD10-I70.0). Critical Value/emergent results were called by telephone at the time of interpretation on 09/20/2020 at 7:36 pm to provider [facility] , who verbally acknowledged these results. Electronically Signed   By: Rolm Baptise M.D.   On: 09/20/2020 19:40   CT Abdomen Pelvis W Contrast  Addendum Date: 09/20/2020   ADDENDUM REPORT: 09/20/2020 19:42 ADDENDUM: After discussing the case with Dr. Cheri Fowler, there is clinical concern for possible colovesical fistula. The abnormal bladder wall thickening is most pronounced superiorly where the bladder wall is inseparable from the large sigmoid colonic mass. Given the history, colovesical fistula is likely. Electronically Signed   By: Rolm Baptise M.D.   On: 09/20/2020 19:42   Result Date: 09/20/2020 CLINICAL DATA:  Lethargic. Fever. Abdominal abscess/infection expected EXAM: CT ABDOMEN AND PELVIS WITH CONTRAST TECHNIQUE: Multidetector CT imaging of the abdomen and pelvis was performed using the standard protocol following bolus administration of intravenous contrast. CONTRAST:  132mL OMNIPAQUE IOHEXOL 350 MG/ML SOLN COMPARISON:  09/01/2020 FINDINGS: Lower chest:  Bilateral pleural effusions, left larger than right. Compressive atelectasis in the lung bases. Heart is borderline in size. Diffuse coronary artery calcifications and distal thoracic aortic calcifications. Hepatobiliary: No focal hepatic abnormality. Gallbladder unremarkable. Pancreas: No focal abnormality or ductal dilatation. Spleen: No focal abnormality.  Normal size. Adrenals/Urinary Tract: Foley catheter within the bladder. Bladder wall is irregular and thickened. There appears to be mucosal enhancement. Appearance is concerning for possible cystitis. Large anterior bladder wall diverticulum noted. Mild bilateral hydronephrosis, right greater than left, slightly improved since prior study. Stomach/Bowel: Complex irregular mass again seen in the proximal to mid sigmoid colon, unchanged since recent study compatible with. No bowel obstruction. Stomach and small bowel decompressed, grossly unremarkable. Colonic malignancy Vascular/Lymphatic: Aortic atherosclerosis. No evidence of aneurysm or adenopathy. Reproductive: Prostate enlargement Other: No free fluid or free air. Musculoskeletal: No acute bony abnormality. Mild compression fractures through the inferior L3 vertebral body is similar to prior study. IMPRESSION: Large complex sigmoid colonic mass again noted compatible with colon cancer. No evidence of bowel obstruction. Mild bladder wall thickening with apparent mucosal enhancement concerning for possible cystitis. Recommend clinical correlation. Foley catheter in place. Large anterior bladder wall diverticulum. Mild bilateral hydronephrosis, right greater than left, slightly improved since prior study. Prostate enlargement. Bilateral pleural effusions, left greater than right. Compressive atelectasis in the left lower lobe. Diffuse coronary artery disease.  Aortic atherosclerosis. Electronically Signed: By: Rolm Baptise M.D. On: 09/20/2020 19:32   DG Chest Port 1 View  Result Date: 09/20/2020 CLINICAL  DATA:  Lethargy and possible sepsis, initial encounter EXAM: PORTABLE CHEST 1 VIEW COMPARISON:  None. FINDINGS: Cardiac shadow is enlarged. Aortic calcifications are noted. Left-sided pleural effusion and underlying atelectasis/infiltrate is seen. Right lung is clear. Mild central vascular congestion is noted. No bony abnormality is noted. IMPRESSION: Left-sided effusion with underlying atelectasis/infiltrate. Mild vascular congestion. Electronically Signed   By: Inez Catalina M.D.   On: 09/20/2020 17:42     Assessment/Plan 80 year old male with history of DM, HTN, recently diagnosed colorectal cancer, with known colovesical fistula with indwelling Foley, recently hospitalized from 5/19-5/25 for persistent diarrhea and severe hyponatremia, improved and discharged to SNF sent in for evaluation of lethargy, fever and sats 80% on room air requiring NRB for transportation.  Bilateral pulmonary embolism (HCC)   Acute respiratory failure with hypoxia ([place]) -Patient presents with shortness of breath, tachypnea with O2 sat 80% requiring NRB to maintain sats in the mid 90s -CTA chest with bilateral pulmonary emboli without evidence of right heart strain, moderate left pleural effusion among other findings - Continue heparin infusion - Continue oxygen to maintain sats above 94% - Spoke to patient about thrombectomy.  States he would just like medication for his condition, no surgical interventions  Hyponatremia - Suspect hypovolemic in the setting of ongoing diarrhea, likely related to colon cancer - Follow urine sodium, osmolality and urine osmolality - IV hydration with normal saline and monitor    Sepsis (HCC)   Indwelling Foley catheter present - Patient with fever, tachycardia, hypoxia, lethargy, leukocytosis though with normal lactic acid - Awaiting urinalysis but given colovesical fistula suspecting UTI as no definite infiltrate seen on CTA chest - IV cefepime and Flagyl - Sepsis fluids -  Follow cultures - Foley catheter care    Diabetes mellitus, type II (HCC) - Sliding scale insulin coverage    Hypertension - Somewhat soft blood pressures so will hold home antihypertensives for now    Diarrhea   Colorectal cancer ([place])   Colovesical fistula - Increased nursing care - Skin barrier precautions  Generalized weakness/physical deconditioning - Nutritionist evaluation and PT eval   DVT prophylaxis: Full dose heparin Code Status: DNR Family Communication:  none, patient did not want me to call his wife to discuss plan of care. Disposition Plan: Back to previous home environment Consults called: none  Status:At the time of admission, it appears that the appropriate admission status for this patient is INPATIENT. This is judged to be reasonable and necessary in order to provide the required intensity of service to ensure the patient's safety given the presenting symptoms, physical exam findings, and initial radiographic and laboratory data in the context of their  Comorbid conditions.   Patient requires inpatient status due to high intensity of service, high risk for further deterioration and high frequency of surveillance required.   I certify that at the point of admission it is my clinical judgment that the patient will require inpatient hospital care spanning beyond [place] MD [facility]     09/20/2020, 8:49 PM

## 2020-09-20 NOTE — Progress Notes (Addendum)
CODE SEPSIS - PHARMACY COMMUNICATION  **Broad Spectrum Antibiotics should be administered within 1 hour of Sepsis diagnosis**  Time Code Sepsis Called/Page Received: 17:10  Antibiotics Ordered: Cefepime, Vancomycin, and Metronidazole  Time of 1st antibiotic administration: Cefepime given at 17:53  Additional action taken by pharmacy: n/a  If necessary, Name of Provider/Nurse Contacted: n/a    Keith Reynolds ,PharmD Clinical Pharmacist  09/20/2020  5:25 PM

## 2020-09-20 NOTE — Sepsis Progress Note (Signed)
Sepsis protocol being followed by [facility] 

## 2020-09-20 NOTE — ED Notes (Signed)
Pt assisted w/ TV remote, lights dimmed per pt request, pt given water per his request. No additional needs identified at this time.

## 2020-09-20 NOTE — Progress Notes (Signed)
PHARMACY -  BRIEF ANTIBIOTIC NOTE   Pharmacy has received consult(s) for Cefepime and Vancomycin from an ED provider.  The patient's profile has been reviewed for ht/wt/allergies/indication/available labs.    One time order(s) placed for Cefepime 2g and Vancomycin 1g by ED provider  Further antibiotics/pharmacy consults should be ordered by admitting physician if indicated.                       Thank you, Vira Blanco 09/20/2020  5:23 PM

## 2020-09-20 NOTE — ED Provider Notes (Signed)
[facility] Emergency Department Provider Note   ____________________________________________   Event Date/Time   First MD Initiated Contact with Patient 09/20/20 1709     (approximate)  I have reviewed the triage vital signs and the nursing notes.   HISTORY  Chief Complaint Shortness of Breath    HPI Keith Reynolds is a 80 y.o. male with below stated past medical history presents from a long-term care facility via EMS complaining of of shortness of breath with a room air saturation of 80% to that improved on 5 L by nonrebreather.  Patient denies any pain at this time and only complains of shortness of breath and a productive cough of yellow/green sputum.  Patient currently denies any vision changes, tinnitus, difficulty speaking, facial droop, sore throat, chest pain, abdominal pain, nausea/vomiting/diarrhea, dysuria, or weakness/numbness/paresthesias in any extremity         Past Medical History:  Diagnosis Date   Acute urinary retention 09/04/2020   Chronic venous stasis 05/28/2017   Colon cancer ([place])    reported by patient and wife   Diarrhea 09/04/2020   Fracture    right forearm, tibia/fibula   Gait instability 05/28/2017   Heart murmur, systolic 0/96/2836   Hernia, abdominal    History of diabetes mellitus 05/28/2017   Hypertension    Hypokalemia 08/29/2020   Hyponatremia 09/04/2020   Malnutrition of moderate degree 08/30/2020   Obesity (BMI 30-39.9) 05/28/2017   Pressure injury of skin 08/30/2020    Patient Active Problem List   Diagnosis Date Noted   Colovesical fistula 09/20/2020   Indwelling Foley catheter present 09/20/2020   Bilateral pulmonary embolism ([place]) 09/20/2020   Acute respiratory failure with hypoxia ([place]) 09/20/2020   Sepsis ([place]) 09/20/2020   Hyponatremia 09/04/2020   Diarrhea 09/04/2020   Decreased oral intake 09/04/2020   Acute urinary retention 09/04/2020   Colorectal cancer ([place]) 09/04/2020   Generalized  muscle weakness 09/04/2020   Pressure injury of skin 08/30/2020   Malnutrition of moderate degree 08/30/2020   Hypokalemia 08/29/2020   Prediabetes 06/03/2017   Hypertension 05/28/2017   Heart murmur, systolic 62/94/7654   History of diabetes mellitus 05/28/2017   Obesity (BMI 30-39.9) 05/28/2017   Gait instability 05/28/2017   Chronic venous stasis 05/28/2017   Diabetes mellitus, type II ([place]) 09/29/2012    Past Surgical History:  Procedure Laterality Date   COLONOSCOPY WITH PROPOFOL N/A 07/19/2020   Procedure: COLONOSCOPY WITH PROPOFOL;  Surgeon: Lesly Rubenstein, MD;  Location: [facility] ENDOSCOPY;  Service: Endoscopy;  Laterality: N/A;  C-19 07/18/2020 AM   COLONOSCOPY WITH PROPOFOL N/A 07/22/2020   Procedure: COLONOSCOPY WITH PROPOFOL;  Surgeon: Lesly Rubenstein, MD;  Location: [facility] ENDOSCOPY;  Service: Endoscopy;  Laterality: N/A;   EYE SURGERY     FRACTURE SURGERY     right forearm, tibia/fibula   HERNIA REPAIR     TONSILLECTOMY     VASECTOMY      Prior to Admission medications   Medication Sig Start Date End Date Taking? Authorizing Provider  acetaminophen (TYLENOL) 325 MG tablet Take 2 tablets (650 mg total) by mouth every 4 (four) hours as needed for mild pain or moderate pain (or Fever >/= 101). 09/04/20   Dwyane Dee, MD  cyclobenzaprine (FLEXERIL) 10 MG tablet Take 0.5 tablets (5 mg total) by mouth 3 (three) times daily as needed for muscle spasms. 09/04/20   Dwyane Dee, MD  loperamide (IMODIUM) 2 MG capsule Take 1 capsule (2 mg total) by mouth every 4 (four)  hours as needed for diarrhea or loose stools. 09/04/20   Dwyane Dee, MD  Multiple Vitamin (MULTIVITAMIN WITH MINERALS) TABS tablet Take 1 tablet by mouth daily. 09/05/20   Dwyane Dee, MD  nystatin (MYCOSTATIN/NYSTOP) powder Apply topically 2 (two) times daily. 09/04/20   Dwyane Dee, MD  promethazine (PHENERGAN) 12.5 MG tablet Take 1 tablet (12.5 mg total) by mouth every 6 (six) hours as needed for  nausea. 09/04/20   Dwyane Dee, MD  tamsulosin (FLOMAX) 0.4 MG CAPS capsule Take 1 capsule (0.4 mg total) by mouth daily. 09/05/20   Dwyane Dee, MD  traZODone (DESYREL) 50 MG tablet Take 0.5 tablets (25 mg total) by mouth at bedtime as needed for sleep. 09/04/20   Dwyane Dee, MD    Allergies Antihistamines, diphenhydramine-type; Bactrim [sulfamethoxazole-trimethoprim]; Chamomile; Diphenhydramine hcl; Gabapentin; Levomenol; Pseudoephedrine hcl; Rosuvastatin; and Triprolidine hcl  Family History  Problem Relation Age of Onset   Heart attack Father    Diabetes Sister     Social History Social History   Tobacco Use   Smoking status: Former    Packs/day: 0.25    Pack years: 0.00    Types: Cigarettes    Quit date: 04/14/1983    Years since quitting: 37.4   Smokeless tobacco: Never  Vaping Use   Vaping Use: Never used  Substance Use Topics   Alcohol use: No   Drug use: No    Review of Systems Constitutional: No fever/chills Eyes: No visual changes. ENT: No sore throat. Cardiovascular: Denies chest pain. Respiratory: Endorses shortness of breath. Gastrointestinal: No abdominal pain.  No nausea, no vomiting.  No diarrhea. Genitourinary: Negative for dysuria. Musculoskeletal: Negative for acute arthralgias Skin: Negative for rash. Neurological: Negative for headaches, weakness/numbness/paresthesias in any extremity Psychiatric: Negative for suicidal ideation/homicidal ideation   ____________________________________________   PHYSICAL EXAM:  VITAL SIGNS: ED Triage Vitals  Enc Vitals Group     BP 09/20/20 1705 117/60     Pulse Rate 09/20/20 1705 (!) 110     Resp 09/20/20 1706 (!) 22     Temp 09/20/20 1709 98.3 F (36.8 C)     Temp Source 09/20/20 1709 Oral     SpO2 09/20/20 1705 93 %     Weight 09/20/20 1705 199 lb 15.3 oz (90.7 kg)     Height 09/20/20 1705 5\' 7"  (1.702 m)     Head Circumference --      [place] Flow --      Pain Score 09/20/20 1707 0     Pain  Loc --      Pain Edu? --      Excl. in [place]? --    Constitutional: Alert and oriented. Well appearing and in no acute distress. Eyes: Conjunctivae are normal. PERRL. Head: Atraumatic. Nose: No congestion/rhinnorhea. Mouth/Throat: Mucous membranes are moist. Neck: No stridor Cardiovascular: Grossly normal heart sounds.  Good peripheral circulation. Respiratory: Normal respiratory effort.  No retractions. Gastrointestinal: Soft and nontender.  Distended. Musculoskeletal: No obvious deformities Neurologic:  Normal speech and language. No gross focal neurologic deficits are appreciated. Skin:  Skin is warm and dry. No rash noted. Psychiatric: Mood and affect are normal. Speech and behavior are normal.  ____________________________________________   LABS (all labs ordered are listed, but only abnormal results are displayed)  Labs Reviewed  COMPREHENSIVE METABOLIC PANEL - Abnormal; Notable for the following components:      Result Value   Sodium 122 (*)    Chloride 88 (*)    Glucose, Bld 172 (*)  BUN 29 (*)    Creatinine, Ser 0.47 (*)    Calcium 7.6 (*)    Total Protein 5.0 (*)    Albumin 2.1 (*)    Total Bilirubin 1.4 (*)    All other components within normal limits  CBC WITH DIFFERENTIAL/PLATELET - Abnormal; Notable for the following components:   WBC 17.1 (*)    RBC 3.46 (*)    Hemoglobin 9.6 (*)    HCT 28.7 (*)    RDW 17.2 (*)    Neutro Abs 14.2 (*)    Monocytes Absolute 1.5 (*)    Abs Immature Granulocytes 0.27 (*)    All other components within normal limits  PROTIME-INR - Abnormal; Notable for the following components:   Prothrombin Time 17.7 (*)    INR 1.5 (*)    All other components within normal limits  APTT - Abnormal; Notable for the following components:   aPTT 39 (*)    All other components within normal limits  BRAIN NATRIURETIC PEPTIDE - Abnormal; Notable for the following components:   B Natriuretic Peptide 158.8 (*)    All other components within  normal limits  RESP PANEL BY RT-PCR (FLU A&B, COVID) ARPGX2  CULTURE, BLOOD (ROUTINE X 2)  CULTURE, BLOOD (ROUTINE X 2)  URINE CULTURE  LACTIC ACID, PLASMA  LACTIC ACID, PLASMA  URINALYSIS, COMPLETE (UACMP) WITH MICROSCOPIC  HEPARIN LEVEL (UNFRACTIONATED)  CBC  TROPONIN I (HIGH SENSITIVITY)  TROPONIN I (HIGH SENSITIVITY)   ____________________________________________  EKG  ED ECG REPORT I, Naaman Plummer, the attending physician, personally viewed and interpreted this ECG.  Date: 09/20/2020 EKG Time: 1711 Rate: 108 Rhythm: Tachycardic sinus rhythm QRS Axis: normal Intervals: normal ST/T Wave abnormalities: normal Narrative Interpretation: no evidence of acute ischemia.  PACs and PVCs  ____________________________________________  RADIOLOGY  ED MD interpretation: Single view portable x-ray of the chest shows left-sided effusion with underlying atelectasis versus infiltrate as well as mild vascular congestion  Official radiology report(s): CT Angio Chest PE W/Cm &/Or Wo Cm  Result Date: 09/20/2020 CLINICAL DATA:  Lethargic.  Low O2 sats. EXAM: CT ANGIOGRAPHY CHEST WITH CONTRAST TECHNIQUE: Multidetector CT imaging of the chest was performed using the standard protocol during bolus administration of intravenous contrast. Multiplanar CT image reconstructions and MIPs were obtained to evaluate the vascular anatomy. CONTRAST:  171mL OMNIPAQUE IOHEXOL 350 MG/ML SOLN COMPARISON:  None. FINDINGS: Cardiovascular: Pulmonary emboli are seen in the right upper and lower lobe pulmonary arterial branches and also in the left upper lobe. No evidence of right heart strain. Diffuse coronary artery calcifications and moderate aortic calcifications. Tortuous aorta. No aneurysm. Mediastinum/Nodes: No mediastinal, hilar, or axillary adenopathy. Trachea and esophagus are unremarkable. Thyroid unremarkable. Lungs/Pleura: Moderate left pleural effusion and small right pleural effusion. Compressive  atelectasis in the left lower lobe. Minimal right base atelectasis. Upper Abdomen: Imaging into the upper abdomen demonstrates no acute findings. Musculoskeletal: Abnormal soft tissue noted in the left lateral chest wall measuring up to 12.5 cm in AP dimension on image 80 of series 4. This may reflect large left lateral chest wall hematoma. This conceivably also could reflect asymmetric edema/anasarca as similar finding was seen on prior abdominal CT from 09/01/2020.No acute bony abnormality. Review of the MIP images confirms the above findings. IMPRESSION: Pulmonary emboli bilaterally, most notable in the right lung particularly right lower lobe. No evidence of right heart strain. Moderate left pleural effusion and small right pleural effusion. Compressive atelectasis in the left lower lobe. Diffuse coronary artery calcifications. Aortic  Atherosclerosis (ICD10-I70.0). Critical Value/emergent results were called by telephone at the time of interpretation on 09/20/2020 at 7:36 pm to provider [facility] , who verbally acknowledged these results. Electronically Signed   By: Rolm Baptise M.D.   On: 09/20/2020 19:40   CT Abdomen Pelvis W Contrast  Addendum Date: 09/20/2020   ADDENDUM REPORT: 09/20/2020 19:42 ADDENDUM: After discussing the case with Dr. Cheri Fowler, there is clinical concern for possible colovesical fistula. The abnormal bladder wall thickening is most pronounced superiorly where the bladder wall is inseparable from the large sigmoid colonic mass. Given the history, colovesical fistula is likely. Electronically Signed   By: Rolm Baptise M.D.   On: 09/20/2020 19:42   Result Date: 09/20/2020 CLINICAL DATA:  Lethargic. Fever. Abdominal abscess/infection expected EXAM: CT ABDOMEN AND PELVIS WITH CONTRAST TECHNIQUE: Multidetector CT imaging of the abdomen and pelvis was performed using the standard protocol following bolus administration of intravenous contrast. CONTRAST:  134mL OMNIPAQUE IOHEXOL 350 MG/ML  SOLN COMPARISON:  09/01/2020 FINDINGS: Lower chest: Bilateral pleural effusions, left larger than right. Compressive atelectasis in the lung bases. Heart is borderline in size. Diffuse coronary artery calcifications and distal thoracic aortic calcifications. Hepatobiliary: No focal hepatic abnormality. Gallbladder unremarkable. Pancreas: No focal abnormality or ductal dilatation. Spleen: No focal abnormality.  Normal size. Adrenals/Urinary Tract: Foley catheter within the bladder. Bladder wall is irregular and thickened. There appears to be mucosal enhancement. Appearance is concerning for possible cystitis. Large anterior bladder wall diverticulum noted. Mild bilateral hydronephrosis, right greater than left, slightly improved since prior study. Stomach/Bowel: Complex irregular mass again seen in the proximal to mid sigmoid colon, unchanged since recent study compatible with. No bowel obstruction. Stomach and small bowel decompressed, grossly unremarkable. Colonic malignancy Vascular/Lymphatic: Aortic atherosclerosis. No evidence of aneurysm or adenopathy. Reproductive: Prostate enlargement Other: No free fluid or free air. Musculoskeletal: No acute bony abnormality. Mild compression fractures through the inferior L3 vertebral body is similar to prior study. IMPRESSION: Large complex sigmoid colonic mass again noted compatible with colon cancer. No evidence of bowel obstruction. Mild bladder wall thickening with apparent mucosal enhancement concerning for possible cystitis. Recommend clinical correlation. Foley catheter in place. Large anterior bladder wall diverticulum. Mild bilateral hydronephrosis, right greater than left, slightly improved since prior study. Prostate enlargement. Bilateral pleural effusions, left greater than right. Compressive atelectasis in the left lower lobe. Diffuse coronary artery disease.  Aortic atherosclerosis. Electronically Signed: By: Rolm Baptise M.D. On: 09/20/2020 19:32   DG  Chest Port 1 View  Result Date: 09/20/2020 CLINICAL DATA:  Lethargy and possible sepsis, initial encounter EXAM: PORTABLE CHEST 1 VIEW COMPARISON:  None. FINDINGS: Cardiac shadow is enlarged. Aortic calcifications are noted. Left-sided pleural effusion and underlying atelectasis/infiltrate is seen. Right lung is clear. Mild central vascular congestion is noted. No bony abnormality is noted. IMPRESSION: Left-sided effusion with underlying atelectasis/infiltrate. Mild vascular congestion. Electronically Signed   By: Inez Catalina M.D.   On: 09/20/2020 17:42    ____________________________________________   PROCEDURES  Procedure(s) performed (including Critical Care):  .1-3 Lead EKG Interpretation  Date/Time: 09/20/2020 6:36 PM Performed by: Naaman Plummer, MD Authorized by: Naaman Plummer, MD     Interpretation: abnormal     ECG rate:  105   ECG rate assessment: tachycardic     Rhythm: sinus rhythm     Ectopy: PAC and PVCs     Conduction: normal   .Critical Care  Date/Time: 09/20/2020 6:36 PM Performed by: Naaman Plummer, MD Authorized by: Naaman Plummer,  MD   Critical care provider statement:    Critical care time (minutes):  33   Critical care time was exclusive of:  Separately billable procedures and treating other patients   Critical care was necessary to treat or prevent imminent or life-threatening deterioration of the following conditions:  Respiratory failure and metabolic crisis   Critical care was time spent personally by me on the following activities:  Discussions with consultants, evaluation of patient's response to treatment, examination of patient, ordering and performing treatments and interventions, ordering and review of laboratory studies, ordering and review of radiographic studies, pulse oximetry, re-evaluation of patient's condition, obtaining history from patient or surrogate and review of old charts   I assumed direction of critical care for this patient from  another provider in my specialty: no     Care discussed with: admitting provider     ____________________________________________   INITIAL IMPRESSION / ASSESSMENT AND PLAN / ED COURSE  As part of my medical decision making, I reviewed the following data within the [place] notes reviewed and incorporated, Labs reviewed, EKG interpreted, Old chart reviewed, Radiograph reviewed and Notes from prior ED visits reviewed and incorporated        Presents with shortness of breath, cough, and malaise concerning for pneumonia.  DDx: PE, COPD exacerbation, Pneumothorax, TB, Atypical ACS, Esophageal Rupture, Toxic Exposure, Foreign Body Airway Obstruction.  Workup: CXR CBC, CMP, lactate, troponin  Given History, Exam, and Workup presentation most consistent with pneumonia.  Findings: Chest x-ray shows left-sided pneumonia versus atelectasis Leukocytosis 17 Hyponatremia 122 CT angiography of the chest shows bilateral pulmonary emboli  Tx: Supplemental O2 Flagyl/vancomycin Heparin drip  1832 Reassessment: As patient is continuing to require supplemental oxygenation for acute hypoxic respiratory failure, patient will require admission to the internal medicine service for further evaluation and management  Disposition: Admit      ____________________________________________   FINAL CLINICAL IMPRESSION(S) / ED DIAGNOSES  Final diagnoses:  HCAP (healthcare-associated pneumonia)  Acute respiratory failure with hypoxia ([place])  Bilateral pulmonary embolism ([place])  Colovesical fistula  Hyponatremia     ED Discharge Orders     None        Note:  This document was prepared using Dragon voice recognition software and may include unintentional dictation errors.    Naaman Plummer, MD 09/20/20 2109

## 2020-09-20 NOTE — ED Notes (Signed)
Pt cleaned of diarrhea. Ulcer noted on sacrum w/ dressing in place.

## 2020-09-20 NOTE — ED Notes (Signed)
Foley catheter noted on arrival- output is brown and very pussy.

## 2020-09-20 NOTE — ED Notes (Signed)
Pt cleaned of diarrhea, new Mepilex applied to sacrum.

## 2020-09-20 NOTE — ED Triage Notes (Signed)
Pt to ED via [facility] from [facility]. EMS stated facility MD saw pt at 0730 and was at baseline. 22mins PTA pt was lethargic, warm to the touch and RA sats 80%. Pt on NRB. Pt febrile at 101.2. CBG 211. Pt with foley in place. Pt hx colon cancer, DM and HTN.

## 2020-09-20 NOTE — Consult Note (Signed)
ANTICOAGULATION CONSULT NOTE - Initial Consult  Pharmacy Consult for heparin infusion Indication: pulmonary embolus  Allergies  Allergen Reactions   Antihistamines, Diphenhydramine-Type    Bactrim [Sulfamethoxazole-Trimethoprim] Nausea Only   Chamomile Other (See Comments)   Diphenhydramine Hcl Other (See Comments)    dizziness   Gabapentin    Levomenol    Pseudoephedrine Hcl Other (See Comments)    dizziness   Rosuvastatin    Triprolidine Hcl Other (See Comments)    dizziness    Patient Measurements: Height: 5\' 7"  (170.2 cm) Weight: 90.7 kg (199 lb 15.3 oz) IBW/kg (Calculated) : 66.1 Heparin Dosing Weight: 85 kg   Vital Signs: Temp: 98.3 F (36.8 C) (06/10 1709) Temp Source: Oral (06/10 1709) BP: 117/59 (06/10 1930) Pulse Rate: 108 (06/10 1940)  Labs: Recent Labs    09/20/20 1710 09/20/20 1910  HGB 9.6*  --   HCT 28.7*  --   PLT 210  --   APTT 39*  --   LABPROT 17.7*  --   INR 1.5*  --   CREATININE 0.47*  --   TROPONINIHS 9 7    Estimated Creatinine Clearance: 79.1 mL/min (A) (by C-G formula based on SCr of 0.47 mg/dL (L)).   Medical History: Past Medical History:  Diagnosis Date   Acute urinary retention 09/04/2020   Chronic venous stasis 05/28/2017   Colon cancer ([place])    reported by patient and wife   Diarrhea 09/04/2020   Fracture    right forearm, tibia/fibula   Gait instability 05/28/2017   Heart murmur, systolic 10/13/4033   Hernia, abdominal    History of diabetes mellitus 05/28/2017   Hypertension    Hypokalemia 08/29/2020   Hyponatremia 09/04/2020   Malnutrition of moderate degree 08/30/2020   Obesity (BMI 30-39.9) 05/28/2017   Pressure injury of skin 08/30/2020    Medications:  No prior anticoagulation noted  Assessment: 80 y.o. male presents from a long-term care facility via EMS complaining of of shortness. Found to have bilateral pulmonary emboli on CT. Pharmacy has been consulted for initiation and management of heparin infusion for  PE.  Baseline aPTT 36, INR 1.5   Goal of Therapy:  Heparin level 0.3-0.7 units/ml Monitor platelets by anticoagulation protocol: Yes   Plan:  Given high baseline INR and hemodynamic stability, will give reduced heparin bolus of 2500 units x 1 Start heparin infusion at 1350 units/hr Check anti-Xa level in 8 hours and daily while on heparin Continue to monitor H&H and platelets  Dorothe Pea, PharmD, BCPS Clinical Pharmacist   09/20/2020,8:13 PM

## 2020-09-21 ENCOUNTER — Inpatient Hospital Stay: Payer: PPO

## 2020-09-21 ENCOUNTER — Encounter: Payer: Self-pay | Admitting: Internal Medicine

## 2020-09-21 LAB — CBC
HCT: 28.1 % — ABNORMAL LOW (ref 39.0–52.0)
Hemoglobin: 9.2 g/dL — ABNORMAL LOW (ref 13.0–17.0)
MCH: 27.1 pg (ref 26.0–34.0)
MCHC: 32.7 g/dL (ref 30.0–36.0)
MCV: 82.9 fL (ref 80.0–100.0)
Platelets: 209 10*3/uL (ref 150–400)
RBC: 3.39 MIL/uL — ABNORMAL LOW (ref 4.22–5.81)
RDW: 16.9 % — ABNORMAL HIGH (ref 11.5–15.5)
WBC: 16.9 10*3/uL — ABNORMAL HIGH (ref 4.0–10.5)
nRBC: 0.1 % (ref 0.0–0.2)

## 2020-09-21 LAB — URINALYSIS, COMPLETE (UACMP) WITH MICROSCOPIC
Bilirubin Urine: NEGATIVE
Glucose, UA: NEGATIVE mg/dL
Ketones, ur: NEGATIVE mg/dL
Nitrite: NEGATIVE
Protein, ur: NEGATIVE mg/dL
RBC / HPF: >50 RBC/hpf — ABNORMAL HIGH (ref 0–5)
Specific Gravity, Urine: 1.006 (ref 1.005–1.030)
Squamous Epithelial / HPF: NONE SEEN (ref 0–5)
WBC, UA: >50 WBC/hpf — ABNORMAL HIGH (ref 0–5)
pH: 7 (ref 5.0–8.0)

## 2020-09-21 LAB — SODIUM, URINE, RANDOM: Sodium, Ur: 25 mmol/L

## 2020-09-21 LAB — PROCALCITONIN: Procalcitonin: 0.35 ng/mL

## 2020-09-21 LAB — BASIC METABOLIC PANEL
Anion gap: 7 (ref 5–15)
BUN: 22 mg/dL (ref 8–23)
CO2: 28 mmol/L (ref 22–32)
Calcium: 7.7 mg/dL — ABNORMAL LOW (ref 8.9–10.3)
Chloride: 89 mmol/L — ABNORMAL LOW (ref 98–111)
Creatinine, Ser: 0.46 mg/dL — ABNORMAL LOW (ref 0.61–1.24)
GFR, Estimated: >60 mL/min (ref >60)
Glucose, Bld: 145 mg/dL — ABNORMAL HIGH (ref 70–99)
Potassium: 3.4 mmol/L — ABNORMAL LOW (ref 3.5–5.1)
Sodium: 124 mmol/L — ABNORMAL LOW (ref 135–145)

## 2020-09-21 LAB — OSMOLALITY, URINE: Osmolality, Ur: 592 mOsm/kg (ref 300–900)

## 2020-09-21 LAB — HEPARIN LEVEL (UNFRACTIONATED)
Heparin Unfractionated: <0.1 IU/mL — ABNORMAL LOW (ref 0.30–0.70)
Heparin Unfractionated: 0.69 IU/mL (ref 0.30–0.70)

## 2020-09-21 LAB — OSMOLALITY: Osmolality: 265 mOsm/kg — ABNORMAL LOW (ref 275–295)

## 2020-09-21 LAB — PROTIME-INR
INR: 1.4 — ABNORMAL HIGH (ref 0.8–1.2)
Prothrombin Time: 16.7 seconds — ABNORMAL HIGH (ref 11.4–15.2)

## 2020-09-21 LAB — CORTISOL-AM, BLOOD: Cortisol - AM: 21.9 ug/dL (ref 6.7–22.6)

## 2020-09-21 LAB — SODIUM: Sodium: 125 mmol/L — ABNORMAL LOW (ref 135–145)

## 2020-09-21 IMAGING — US US EXTREM LOW VENOUS
1 series · 13 of 24 positions shown · non-contrast
Comparison: Ultrasound [DATE]

CLINICAL DATA: Swelling, bilateral pulmonary emboli



[Series 1: us venous img lower bilat (dvt) · portal-venous · 13 of 82 slices shown]
[im 1/82]
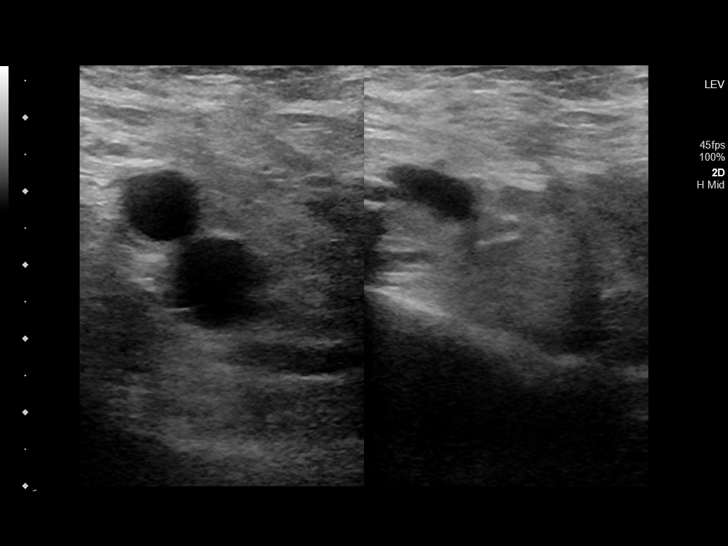
[im 8/82]
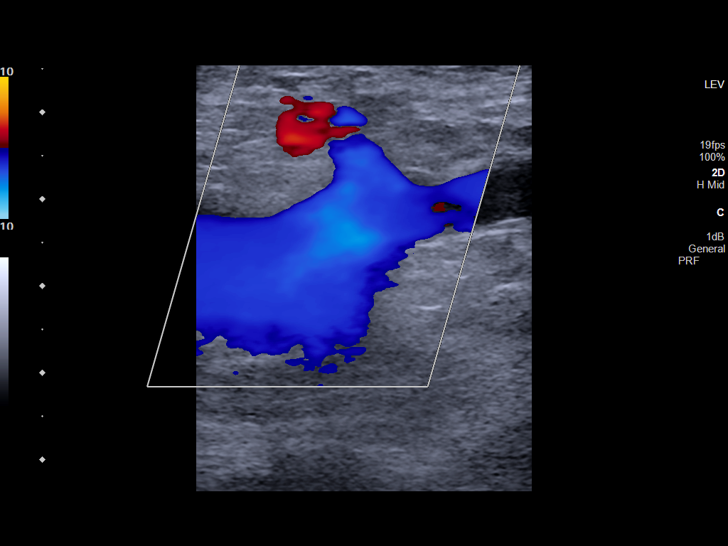
[im 15/82]
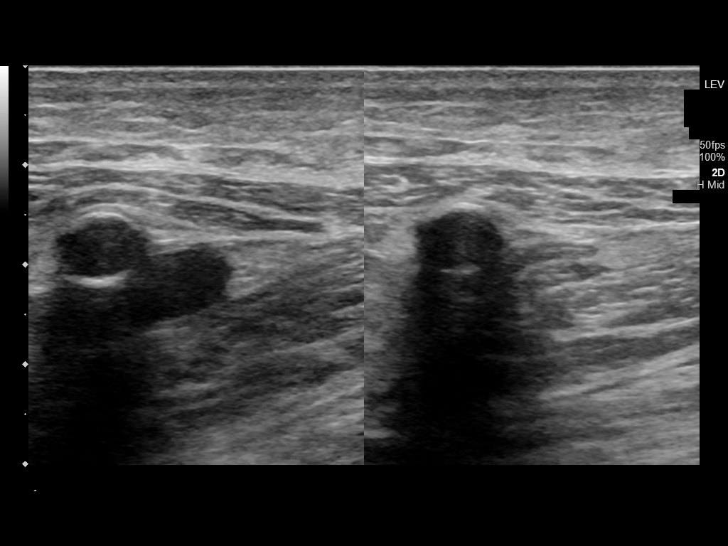
[im 22/82]
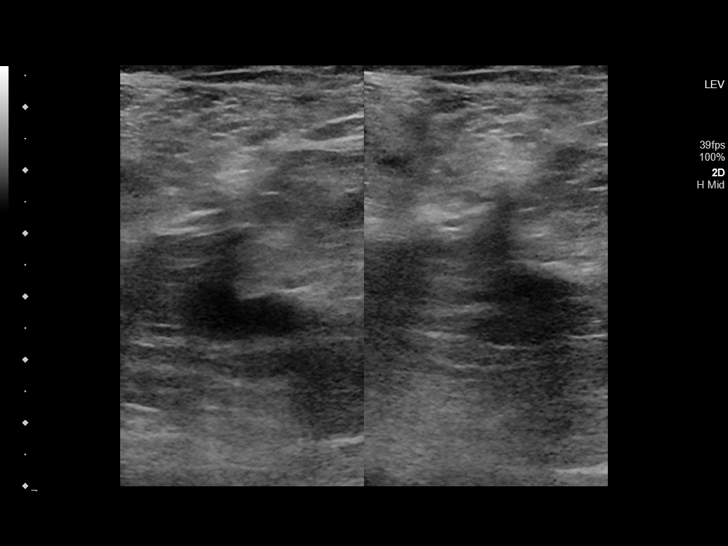
[im 29/82]
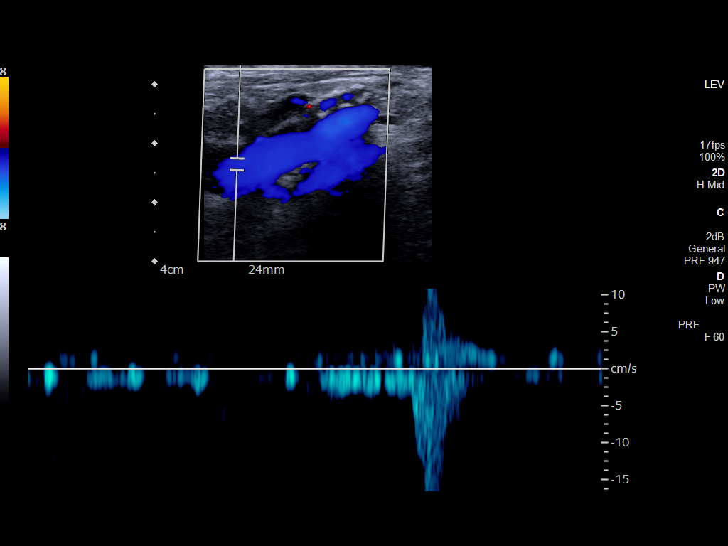
[im 36/82]
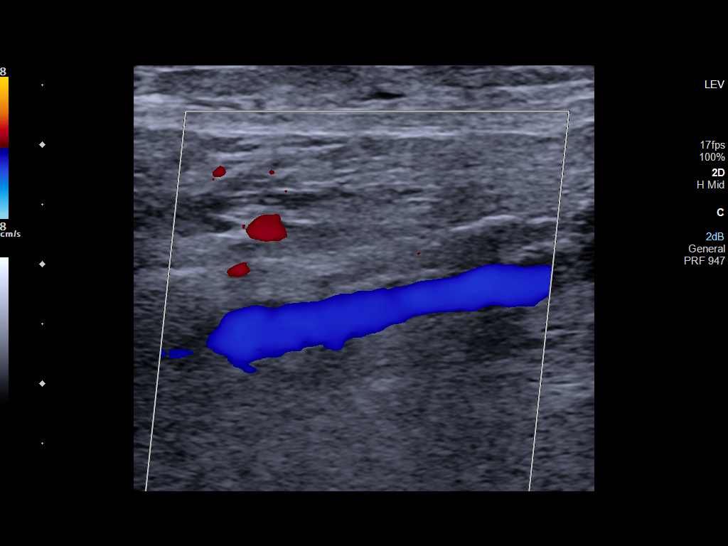
[im 43/82]
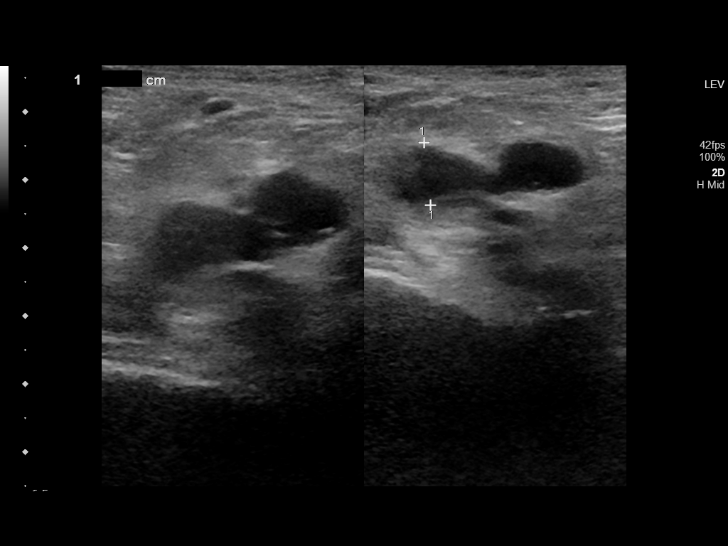
[im 46/82]
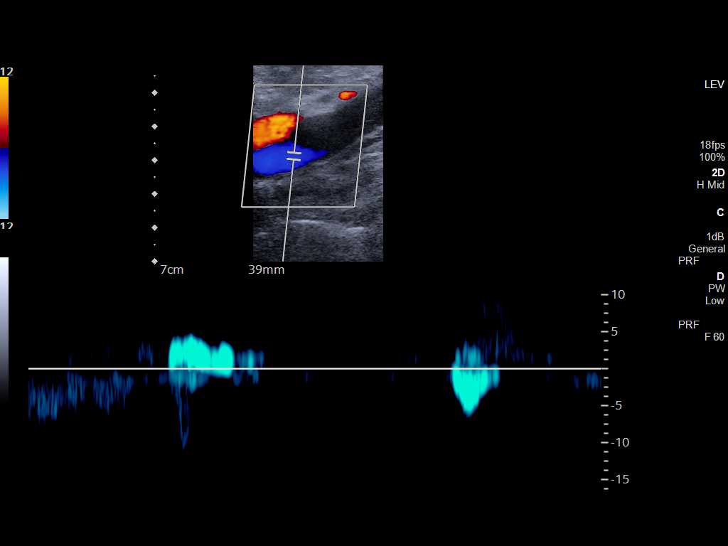
[im 53/82]
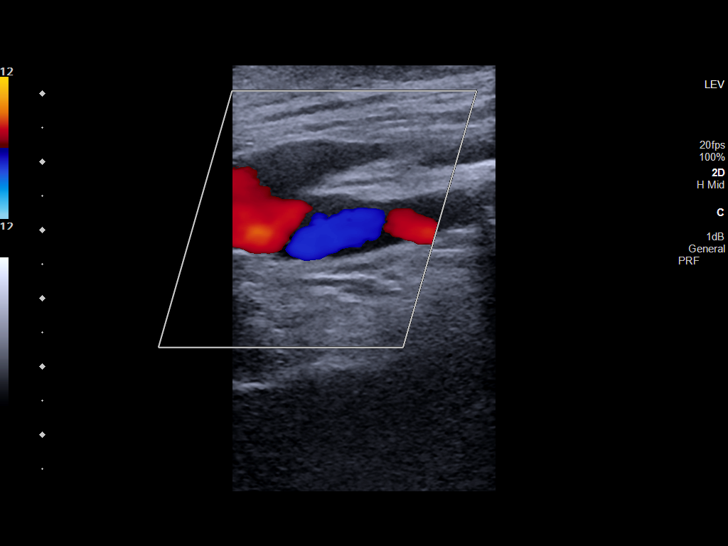
[im 60/82]
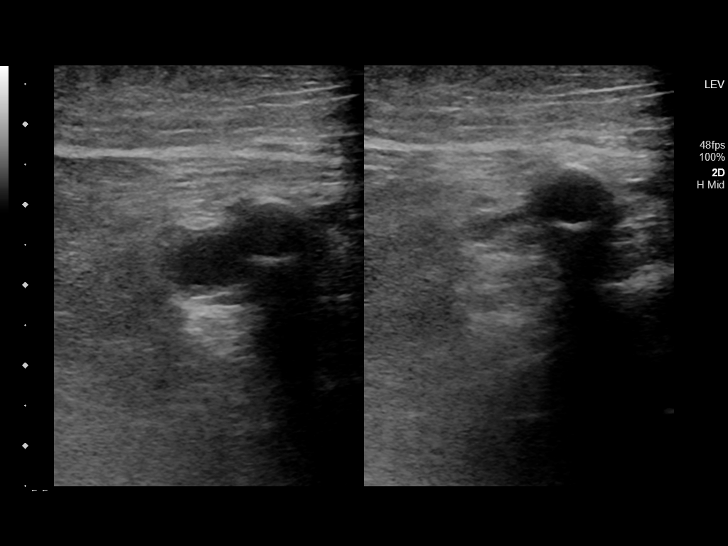
[im 67/82]
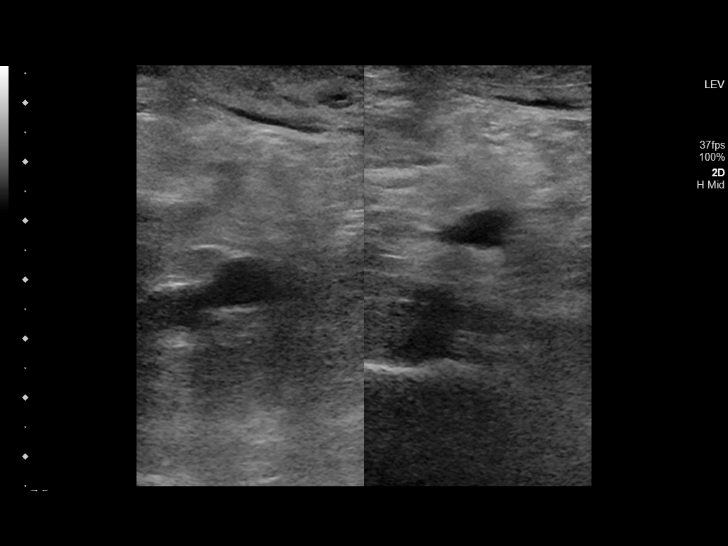
[im 74/82]
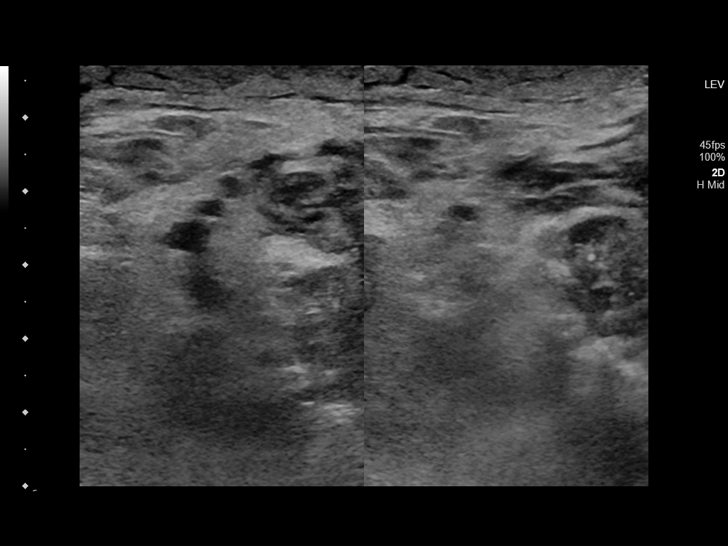
[im 82/82]
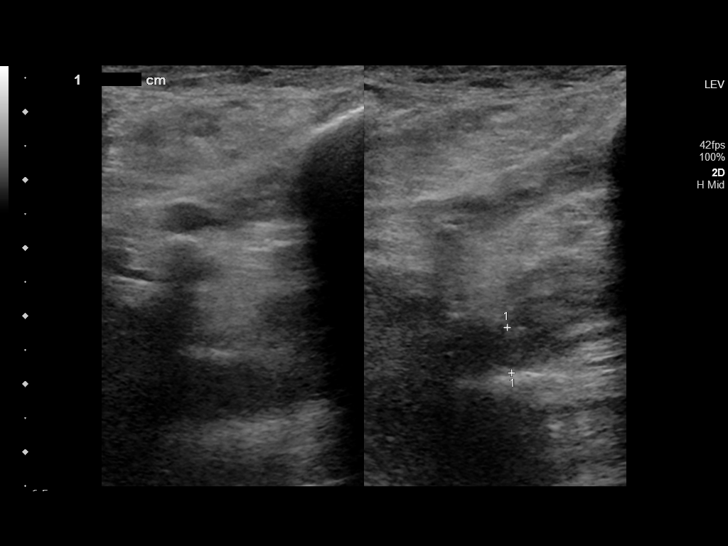

[13 of 24 positions shown; findings below may reference images not displayed]

FINDINGS: RIGHT LOWER EXTREMITY

Common Femoral Vein: No evidence of thrombus. Normal
compressibility, respiratory phasicity and response to augmentation.

Saphenofemoral Junction: No evidence of thrombus. Normal
compressibility and flow on color Doppler imaging.

Profunda Femoral Vein: No evidence of thrombus. Normal
compressibility and flow on color Doppler imaging.

Femoral Vein: No evidence of thrombus. Normal compressibility,
respiratory phasicity and response to augmentation.

Popliteal Vein: No evidence of thrombus. Normal compressibility,
respiratory phasicity and response to augmentation.

Calf Veins: No evidence of thrombus. Normal compressibility and flow
on color Doppler imaging.

Superficial Great Saphenous Vein: No evidence of thrombus. Normal
compressibility.

Venous Reflux:  None.

Other Findings:  Lower extremity edema.

LEFT LOWER EXTREMITY

Common Femoral Vein: Nonocclusive thrombus is present. Normal
respiratory phasicity.

Saphenofemoral Junction: Nonocclusive thrombus is present.

Profunda Femoral Vein: Nonocclusive thrombus is present.

Femoral Vein: Nearly occlusive thrombus in the proximal femoral
vein. The mid and distal femoral vein is patent.

Popliteal Vein: No evidence of thrombus.

Calf Veins: Nonocclusive thrombus is present in the anterior and
posterior tibial veins and the peroneal vein without
compressibility.

Superficial Great Saphenous Vein: Patent.

Venous Reflux:  Not applicable.

Other Findings:  Lower extremity edema.
IMPRESSION: Positive for DVT in the left lower extremity with nonocclusive
thrombus in the common femoral vein, saphenofemoral junction,
profundus femoris, proximal femoral vein, and calf veins. Thrombus
in the proximal femoral vein appears nearly occlusive, though mid
and distal femoral vein are patent.

No evidence of DVT in the right lower extremity.

## 2020-09-21 MED ORDER — HEPARIN BOLUS VIA INFUSION
2500.0000 [IU] | Freq: Once | INTRAVENOUS | Status: AC
  Administered 2020-09-21: 2500 [IU] via INTRAVENOUS
  Filled 2020-09-21: qty 2500

## 2020-09-21 MED ORDER — HEPARIN BOLUS VIA INFUSION
2500.0000 [IU] | Freq: Once | INTRAVENOUS | Status: DC
  Filled 2020-09-21: qty 2500

## 2020-09-21 MED ORDER — LACTATED RINGERS IV SOLN: INTRAVENOUS | Status: DC

## 2020-09-21 MED ORDER — SODIUM CHLORIDE 0.9 % IV SOLN
2.0000 g | Freq: Three times a day (TID) | INTRAVENOUS | Status: AC
  Administered 2020-09-21 – 2020-09-24 (×10): 2 g via INTRAVENOUS
  Filled 2020-09-21 (×12): qty 2

## 2020-09-21 MED ORDER — METRONIDAZOLE 500 MG/100ML IV SOLN
500.0000 mg | Freq: Three times a day (TID) | INTRAVENOUS | Status: AC
  Administered 2020-09-21 – 2020-09-24 (×10): 500 mg via INTRAVENOUS
  Filled 2020-09-21 (×13): qty 100

## 2020-09-21 MED ORDER — CHLORHEXIDINE GLUCONATE CLOTH 2 % EX PADS
6.0000 | MEDICATED_PAD | Freq: Every day | CUTANEOUS | Status: DC
  Administered 2020-09-21 – 2020-09-27 (×7): 6 via TOPICAL

## 2020-09-21 MED ORDER — ORAL CARE MOUTH RINSE
15.0000 mL | Freq: Two times a day (BID) | OROMUCOSAL | Status: DC
  Administered 2020-09-22 – 2020-09-27 (×8): 15 mL via OROMUCOSAL

## 2020-09-21 NOTE — ED Notes (Signed)
Pt cleaned of stool.  Pt noted to be incontinent of stool.  Bed linens changed at this time, and clean brief applied to pt.

## 2020-09-21 NOTE — Progress Notes (Signed)
PROGRESS NOTE    Keith Reynolds  GYI:[id number] DOB: 19-Feb-1941 DOA: 09/20/2020 PCP: Sofie Hartigan, MD    Brief Narrative:  Keith Reynolds is a 80 y.o. male with medical history significant for DM, HTN, recently diagnosed colorectal cancer, with known colovesical fistula with indwelling Foley, recently hospitalized from 5/19-5/25 for persistent diarrhea and severe hyponatremia, improved and discharged to SNF who was in his usual state of health until the morning of admission when he had an outpatient doctor's appointment however by the afternoon he was noted to be lethargic at the facility, febrile at 101.2 with O2 sats 80% on room air.  Requiring NRB for transport with EMS.  Patient has been having continued watery diarrhea and poor appetite but no vomiting.  He has suprapubic abdominal pain with mild to moderate intensity, nonradiating.  Has some cough and shortness of breath but no chest pain.  He has generalized weakness. ED course: On arrival, tachycardic at 113, tachypneic at 22 with O2 sat of 95 on nonrebreather.  Afebrile with BP 117/60  CTA chest with bilateral pulmonary emboli without evidence of right heart strain, moderate left pleural effusion among other findings CT abdomen and pelvis with large complex sigmoid colonic mass compatible with colon cancer without evidence of bowel obstruction, colovesical fistula, mild bilateral hydronephrosis, improved from prior study among other findings  6/11- c/o sob. No cp.   Consultants:    Procedures:   Antimicrobials:  Cefepime, metronidazole, vancomycin   Subjective: Lying in bed, denies dizziness.  Still with short of breath.  Objective: Vitals:   09/21/20 1000 09/21/20 1130 09/21/20 1400 09/21/20 1602  BP: 114/75 (!) 114/52 123/61 (!) 118/59  Pulse: 99 89 86 97  Resp: (!) 24 19 20 16   Temp:   98.8 F (37.1 C) 98.5 F (36.9 C)  TempSrc:   Oral Oral  SpO2:  94% 94% 93%  Weight:      Height:         Intake/Output Summary (Last 24 hours) at 09/21/2020 1648 Last data filed at 09/21/2020 1234 Gross per 24 hour  Intake --  Output 1300 ml  Net -1300 ml   Filed Weights   09/20/20 1705  Weight: 90.7 kg    Examination:  General exam: Appears calm and comfortable  Respiratory system: decrease bs , no wheezing Cardiovascular system: S1 & S2 heard, RRR. No JVD, murmurs, rubs, gallops or clicks.  Gastrointestinal system: Abdomen is nondistended, soft and nontender. Normal bowel sounds heard. Central nervous system: Alert and oriented. Grossly intact Extremities: Bilateral lower extremity edema left more than right Skin: Warm dry Psychiatry: Mood & affect appropriate.     Data Reviewed: I have personally reviewed following labs and imaging studies  CBC: Recent Labs  Lab 09/20/20 1710 09/21/20 0500  WBC 17.1* 16.9*  NEUTROABS 14.2*  --   HGB 9.6* 9.2*  HCT 28.7* 28.1*  MCV 82.9 82.9  PLT 210 350   Basic Metabolic Panel: Recent Labs  Lab 09/20/20 1710 09/21/20 0500  NA 122* 124*  125*  K 3.9 3.4*  CL 88* 89*  CO2 27 28  GLUCOSE 172* 145*  BUN 29* 22  CREATININE 0.47* 0.46*  CALCIUM 7.6* 7.7*   GFR: Estimated Creatinine Clearance: 79.1 mL/min (A) (by C-G formula based on SCr of 0.46 mg/dL (L)). Liver Function Tests: Recent Labs  Lab 09/20/20 1710  AST 15  ALT 9  ALKPHOS 96  BILITOT 1.4*  PROT 5.0*  ALBUMIN 2.1*   No results  for input(s): LIPASE, AMYLASE in the last 168 hours. No results for input(s): AMMONIA in the last 168 hours. Coagulation Profile: Recent Labs  Lab 09/20/20 1710 09/21/20 0500  INR 1.5* 1.4*   Cardiac Enzymes: No results for input(s): CKTOTAL, CKMB, CKMBINDEX, TROPONINI in the last 168 hours. BNP (last 3 results) No results for input(s): PROBNP in the last 8760 hours. HbA1C: No results for input(s): HGBA1C in the last 72 hours. CBG: No results for input(s): GLUCAP in the last 168 hours. Lipid Profile: No results for  input(s): CHOL, HDL, LDLCALC, TRIG, CHOLHDL, LDLDIRECT in the last 72 hours. Thyroid Function Tests: No results for input(s): TSH, T4TOTAL, FREET4, T3FREE, THYROIDAB in the last 72 hours. Anemia Panel: No results for input(s): VITAMINB12, FOLATE, FERRITIN, TIBC, IRON, RETICCTPCT in the last 72 hours. Sepsis Labs: Recent Labs  Lab 09/20/20 1711 09/20/20 1910 09/21/20 0500  PROCALCITON  --   --  0.35  LATICACIDVEN 1.0 1.0  --     Recent Results (from the past 240 hour(s))  Blood Culture (routine x 2)     Status: None (Preliminary result)   Collection Time: 09/20/20  5:11 PM   Specimen: BLOOD  Result Value Ref Range Status   Specimen Description BLOOD BLOOD RIGHT FOREARM  Final   Special Requests   Final    BOTTLES DRAWN AEROBIC AND ANAEROBIC Blood Culture results may not be optimal due to an inadequate volume of blood received in culture bottles   Culture   Final    NO GROWTH < 24 HOURS Performed at [facility] [address], [place], [place] [address]    Report Status PENDING  Incomplete  Blood Culture (routine x 2)     Status: None (Preliminary result)   Collection Time: 09/20/20  5:11 PM   Specimen: BLOOD  Result Value Ref Range Status   Specimen Description BLOOD RIGHT ANTECUBITAL  Final   Special Requests   Final    BOTTLES DRAWN AEROBIC AND ANAEROBIC Blood Culture results may not be optimal due to an inadequate volume of blood received in culture bottles   Culture   Final    NO GROWTH < 24 HOURS Performed at [facility], [address] [place], [place] [address]    Report Status PENDING  Incomplete  Resp Panel by RT-PCR (Flu A&B, Covid) Nasopharyngeal Swab     Status: None   Collection Time: 09/20/20  5:18 PM   Specimen: Nasopharyngeal Swab; Nasopharyngeal(NP) swabs in vial transport medium  Result Value Ref Range Status   SARS Coronavirus 2 by RT PCR NEGATIVE NEGATIVE Final    Comment: (NOTE) SARS-CoV-2 target nucleic acids are NOT  DETECTED.  The SARS-CoV-2 RNA is generally detectable in upper respiratory specimens during the acute phase of infection. The lowest concentration of SARS-CoV-2 viral copies this assay can detect is 138 copies/mL. A negative result does not preclude SARS-Cov-2 infection and should not be used as the sole basis for treatment or other patient management decisions. A negative result may occur with  improper specimen collection/handling, submission of specimen other than nasopharyngeal swab, presence of viral mutation(s) within the areas targeted by this assay, and inadequate number of viral copies(<138 copies/mL). A negative result must be combined with clinical observations, patient history, and epidemiological information. The expected result is Negative.  Fact Sheet for Patients:  EntrepreneurPulse.com.au  Fact Sheet for Healthcare Providers:  IncredibleEmployment.be  This test is no t yet approved or cleared by the [place] FDA and  has  been authorized for detection and/or diagnosis of SARS-CoV-2 by FDA under an Emergency Use Authorization (EUA). This EUA will remain  in effect (meaning this test can be used) for the duration of the COVID-19 declaration under Section 564(b)(1) of the Act, 21 U.S.C.section 360bbb-3(b)(1), unless the authorization is terminated  or revoked sooner.       Influenza A by PCR NEGATIVE NEGATIVE Final   Influenza B by PCR NEGATIVE NEGATIVE Final    Comment: (NOTE) The Xpert Xpress SARS-CoV-2/FLU/RSV plus assay is intended as an aid in the diagnosis of influenza from Nasopharyngeal swab specimens and should not be used as a sole basis for treatment. Nasal washings and aspirates are unacceptable for Xpert Xpress SARS-CoV-2/FLU/RSV testing.  Fact Sheet for Patients: EntrepreneurPulse.com.au  Fact Sheet for Healthcare Providers: IncredibleEmployment.be  This test is not yet  approved or cleared by the [place] FDA and has been authorized for detection and/or diagnosis of SARS-CoV-2 by FDA under an Emergency Use Authorization (EUA). This EUA will remain in effect (meaning this test can be used) for the duration of the COVID-19 declaration under Section 564(b)(1) of the Act, 21 U.S.C. section 360bbb-3(b)(1), unless the authorization is terminated or revoked.  Performed at [facility], [address], [place], [place] [address]          Radiology Studies: CT Angio Chest PE W/Cm &/Or Wo Cm  Result Date: 09/20/2020 CLINICAL DATA:  Lethargic.  Low O2 sats. EXAM: CT ANGIOGRAPHY CHEST WITH CONTRAST TECHNIQUE: Multidetector CT imaging of the chest was performed using the standard protocol during bolus administration of intravenous contrast. Multiplanar CT image reconstructions and MIPs were obtained to evaluate the vascular anatomy. CONTRAST:  172mL OMNIPAQUE IOHEXOL 350 MG/ML SOLN COMPARISON:  None. FINDINGS: Cardiovascular: Pulmonary emboli are seen in the right upper and lower lobe pulmonary arterial branches and also in the left upper lobe. No evidence of right heart strain. Diffuse coronary artery calcifications and moderate aortic calcifications. Tortuous aorta. No aneurysm. Mediastinum/Nodes: No mediastinal, hilar, or axillary adenopathy. Trachea and esophagus are unremarkable. Thyroid unremarkable. Lungs/Pleura: Moderate left pleural effusion and small right pleural effusion. Compressive atelectasis in the left lower lobe. Minimal right base atelectasis. Upper Abdomen: Imaging into the upper abdomen demonstrates no acute findings. Musculoskeletal: Abnormal soft tissue noted in the left lateral chest wall measuring up to 12.5 cm in AP dimension on image 80 of series 4. This may reflect large left lateral chest wall hematoma. This conceivably also could reflect asymmetric edema/anasarca as similar finding was seen on prior abdominal CT from 09/01/2020.No  acute bony abnormality. Review of the MIP images confirms the above findings. IMPRESSION: Pulmonary emboli bilaterally, most notable in the right lung particularly right lower lobe. No evidence of right heart strain. Moderate left pleural effusion and small right pleural effusion. Compressive atelectasis in the left lower lobe. Diffuse coronary artery calcifications. Aortic Atherosclerosis (ICD10-I70.0). Critical Value/emergent results were called by telephone at the time of interpretation on 09/20/2020 at 7:36 pm to provider [facility] , who verbally acknowledged these results. Electronically Signed   By: Rolm Baptise M.D.   On: 09/20/2020 19:40   CT Abdomen Pelvis W Contrast  Addendum Date: 09/20/2020   ADDENDUM REPORT: 09/20/2020 19:42 ADDENDUM: After discussing the case with Dr. Cheri Fowler, there is clinical concern for possible colovesical fistula. The abnormal bladder wall thickening is most pronounced superiorly where the bladder wall is inseparable from the large sigmoid colonic mass. Given the history, colovesical fistula is likely. Electronically Signed   By:  Rolm Baptise M.D.   On: 09/20/2020 19:42   Result Date: 09/20/2020 CLINICAL DATA:  Lethargic. Fever. Abdominal abscess/infection expected EXAM: CT ABDOMEN AND PELVIS WITH CONTRAST TECHNIQUE: Multidetector CT imaging of the abdomen and pelvis was performed using the standard protocol following bolus administration of intravenous contrast. CONTRAST:  129mL OMNIPAQUE IOHEXOL 350 MG/ML SOLN COMPARISON:  09/01/2020 FINDINGS: Lower chest: Bilateral pleural effusions, left larger than right. Compressive atelectasis in the lung bases. Heart is borderline in size. Diffuse coronary artery calcifications and distal thoracic aortic calcifications. Hepatobiliary: No focal hepatic abnormality. Gallbladder unremarkable. Pancreas: No focal abnormality or ductal dilatation. Spleen: No focal abnormality.  Normal size. Adrenals/Urinary Tract: Foley catheter within  the bladder. Bladder wall is irregular and thickened. There appears to be mucosal enhancement. Appearance is concerning for possible cystitis. Large anterior bladder wall diverticulum noted. Mild bilateral hydronephrosis, right greater than left, slightly improved since prior study. Stomach/Bowel: Complex irregular mass again seen in the proximal to mid sigmoid colon, unchanged since recent study compatible with. No bowel obstruction. Stomach and small bowel decompressed, grossly unremarkable. Colonic malignancy Vascular/Lymphatic: Aortic atherosclerosis. No evidence of aneurysm or adenopathy. Reproductive: Prostate enlargement Other: No free fluid or free air. Musculoskeletal: No acute bony abnormality. Mild compression fractures through the inferior L3 vertebral body is similar to prior study. IMPRESSION: Large complex sigmoid colonic mass again noted compatible with colon cancer. No evidence of bowel obstruction. Mild bladder wall thickening with apparent mucosal enhancement concerning for possible cystitis. Recommend clinical correlation. Foley catheter in place. Large anterior bladder wall diverticulum. Mild bilateral hydronephrosis, right greater than left, slightly improved since prior study. Prostate enlargement. Bilateral pleural effusions, left greater than right. Compressive atelectasis in the left lower lobe. Diffuse coronary artery disease.  Aortic atherosclerosis. Electronically Signed: By: Rolm Baptise M.D. On: 09/20/2020 19:32   US Venous Img Lower Bilateral (DVT)  Result Date: 09/21/2020 CLINICAL DATA:  Swelling, bilateral pulmonary emboli EXAM: BILATERAL LOWER EXTREMITY VENOUS DOPPLER ULTRASOUND TECHNIQUE: Gray-scale sonography with graded compression, as well as color Doppler and duplex ultrasound were performed to evaluate the lower extremity deep venous systems from the level of the common femoral vein and including the common femoral, femoral, profunda femoral, popliteal and calf veins  including the posterior tibial, peroneal and gastrocnemius veins when visible. The superficial great saphenous vein was also interrogated. Spectral Doppler was utilized to evaluate flow at rest and with distal augmentation maneuvers in the common femoral, femoral and popliteal veins. COMPARISON:  Ultrasound 08/29/2020 FINDINGS: RIGHT LOWER EXTREMITY Common Femoral Vein: No evidence of thrombus. Normal compressibility, respiratory phasicity and response to augmentation. Saphenofemoral Junction: No evidence of thrombus. Normal compressibility and flow on color Doppler imaging. Profunda Femoral Vein: No evidence of thrombus. Normal compressibility and flow on color Doppler imaging. Femoral Vein: No evidence of thrombus. Normal compressibility, respiratory phasicity and response to augmentation. Popliteal Vein: No evidence of thrombus. Normal compressibility, respiratory phasicity and response to augmentation. Calf Veins: No evidence of thrombus. Normal compressibility and flow on color Doppler imaging. Superficial Great Saphenous Vein: No evidence of thrombus. Normal compressibility. Venous Reflux:  None. Other Findings:  Lower extremity edema. LEFT LOWER EXTREMITY Common Femoral Vein: Nonocclusive thrombus is present. Normal respiratory phasicity. Saphenofemoral Junction: Nonocclusive thrombus is present. Profunda Femoral Vein: Nonocclusive thrombus is present. Femoral Vein: Nearly occlusive thrombus in the proximal femoral vein. The mid and distal femoral vein is patent. Popliteal Vein: No evidence of thrombus. Calf Veins: Nonocclusive thrombus is present in the anterior and posterior tibial veins and  the peroneal vein without compressibility. Superficial Great Saphenous Vein: Patent. Venous Reflux:  Not applicable. Other Findings:  Lower extremity edema. IMPRESSION: Positive for DVT in the left lower extremity with nonocclusive thrombus in the common femoral vein, saphenofemoral junction, profundus femoris, proximal  femoral vein, and calf veins. Thrombus in the proximal femoral vein appears nearly occlusive, though mid and distal femoral vein are patent. No evidence of DVT in the right lower extremity. Electronically Signed   By: Maurine Simmering   On: 09/21/2020 11:40   DG Chest Port 1 View  Result Date: 09/20/2020 CLINICAL DATA:  Lethargy and possible sepsis, initial encounter EXAM: PORTABLE CHEST 1 VIEW COMPARISON:  None. FINDINGS: Cardiac shadow is enlarged. Aortic calcifications are noted. Left-sided pleural effusion and underlying atelectasis/infiltrate is seen. Right lung is clear. Mild central vascular congestion is noted. No bony abnormality is noted. IMPRESSION: Left-sided effusion with underlying atelectasis/infiltrate. Mild vascular congestion. Electronically Signed   By: Inez Catalina M.D.   On: 09/20/2020 17:42        Scheduled Meds:  Chlorhexidine Gluconate Cloth  6 each Topical Daily   Continuous Infusions:  ceFEPime (MAXIPIME) IV     heparin 1,650 Units/hr (09/21/20 5809)   metronidazole      Assessment & Plan:   Principal Problem:   Bilateral pulmonary embolism (HCC) Active Problems:   Diabetes mellitus, type II ([place])   Hypertension   Hyponatremia   Diarrhea   Colorectal cancer ([place])   Colovesical fistula   Indwelling Foley catheter present   Acute respiratory failure with hypoxia ([place])   Sepsis ([place])   80 year old male with history of DM, HTN, recently diagnosed colorectal cancer, with known colovesical fistula with indwelling Foley, recently hospitalized from 5/19-5/25 for persistent diarrhea and severe hyponatremia, improved and discharged to SNF sent in for evaluation of lethargy, fever and sats 80% on room air requiring NRB for transportation.       Bilateral pulmonary embolism (HCC)   Acute respiratory failure with hypoxia ([place]) -Patient presents with shortness of breath, tachypnea with O2 sat 80% requiring NRB to maintain sats in the mid 90s -CTA chest with bilateral  pulmonary emboli without evidence of right heart strain, moderate left pleural effusion among other findings 6/11- continue iv heparin Patient declined thrombectomy Per admission note Currently on 4 L satting 93% Once oxygenation improves can switch to Eliquis or Xarelto   Hyponatremia - Suspect hypovolemic in the setting of ongoing diarrhea, likely related to colon cancer 6/11 start LR at 50 MLS per hour Monitor levels     Sepsis ([place])   Indwelling Foley catheter present - Patient with fever, tachycardia, hypoxia, lethargy, leukocytosis though with normal lactic acid 6/11-UA positive. Will continue IV cefepime and Flagyl LR Follow-up blood cultures      Diabetes mellitus, type II (HCC) BG stable Continue ISS     Hypertension - Somewhat soft blood pressures so will hold home antihypertensives for now     Diarrhea   Colorectal cancer ([place])   Colovesical fistula - Increased nursing care - Skin barrier precautions   Generalized weakness/physical deconditioning - Nutritionist evaluation and PT eval     DVT prophylaxis: IV heparin Code Status: DNR Family Communication: Daughter at bedside Disposition Plan:  Status is: Inpatient  Remains inpatient appropriate because:Inpatient level of care appropriate due to severity of illness  Dispo: The patient is from: Home              Anticipated d/c is to: Home  Patient currently is not medically stable to d/c.   Difficult to place patient No            LOS: 1 day   Time spent: 35 minutes with more than 50% on [place], MD [facility] Pager [phone]  If 7PM-7AM, please contact night-coverage 09/21/2020, 4:48 PM

## 2020-09-21 NOTE — ED Notes (Signed)
Patient cleaned, repositioned, and linen changed. Patient resting comfortably. Call light in reach. Fall precautions in place. Patients daughter at bedside. Foley emptied. 600cc of cloudy, amber urine.

## 2020-09-21 NOTE — ED Notes (Signed)
Pt incontinent of stool, brief and sheets changed

## 2020-09-21 NOTE — Plan of Care (Signed)

## 2020-09-21 NOTE — Consult Note (Signed)
ANTICOAGULATION CONSULT NOTE - Initial Consult  Pharmacy Consult for heparin infusion Indication: pulmonary embolus  Allergies  Allergen Reactions   Antihistamines, Diphenhydramine-Type    Bactrim [Sulfamethoxazole-Trimethoprim] Nausea Only   Chamomile Other (See Comments)   Diphenhydramine Hcl Other (See Comments)    dizziness   Gabapentin    Levomenol    Pseudoephedrine Hcl Other (See Comments)    dizziness   Rosuvastatin    Triprolidine Hcl Other (See Comments)    dizziness    Patient Measurements: Height: 5\' 7"  (170.2 cm) Weight: 90.7 kg (199 lb 15.3 oz) IBW/kg (Calculated) : 66.1 Heparin Dosing Weight: 85 kg   Vital Signs: Temp: 98.5 F (36.9 C) (06/11 1602) Temp Source: Oral (06/11 1602) BP: 118/59 (06/11 1602) Pulse Rate: 97 (06/11 1602)  Labs: Recent Labs    09/20/20 1710 09/20/20 1910 09/21/20 0500 09/21/20 1613  HGB 9.6*  --  9.2*  --   HCT 28.7*  --  28.1*  --   PLT 210  --  209  --   APTT 39*  --   --   --   LABPROT 17.7*  --  16.7*  --   INR 1.5*  --  1.4*  --   HEPARINUNFRC  --   --  <0.10* 0.69  CREATININE 0.47*  --  0.46*  --   TROPONINIHS 9 7  --   --      Estimated Creatinine Clearance: 79.1 mL/min (A) (by C-G formula based on SCr of 0.46 mg/dL (L)).   Medical History: Past Medical History:  Diagnosis Date   Acute urinary retention 09/04/2020   Chronic venous stasis 05/28/2017   Colon cancer ([place])    reported by patient and wife   Diarrhea 09/04/2020   Fracture    right forearm, tibia/fibula   Gait instability 05/28/2017   Heart murmur, systolic 5/73/2202   Hernia, abdominal    History of diabetes mellitus 05/28/2017   Hypertension    Hypokalemia 08/29/2020   Hyponatremia 09/04/2020   Malnutrition of moderate degree 08/30/2020   Obesity (BMI 30-39.9) 05/28/2017   Pressure injury of skin 08/30/2020    Medications:  No prior anticoagulation noted  Assessment: 80 y.o. male presents from a long-term care facility via EMS  complaining of of shortness. Found to have bilateral pulmonary emboli on CT. Pharmacy has been consulted for initiation and management of heparin infusion for PE.  Baseline aPTT 36, INR 1.5   6/11 0500 HL <0.10, rate increased to 1650 units/hr 6/11 1613 HL 0.69, therapeutic x 1  Goal of Therapy:  Heparin level 0.3-0.7 units/ml Monitor platelets by anticoagulation protocol: Yes   Plan:  Will continue with current rate of 1650 units/hr. Recheck HL in 8 hours for confirmation. Daily CBC while on Heparin drip per protocol.  Paulina Fusi, PharmD, BCPS 09/21/2020 5:55 PM

## 2020-09-21 NOTE — ED Notes (Signed)
pts bed linens changed at this time d/t another episode of diarrhea.  Pt given cup of water and denies any further needs at this time.

## 2020-09-21 NOTE — Consult Note (Signed)
ANTICOAGULATION CONSULT NOTE - Initial Consult  Pharmacy Consult for heparin infusion Indication: pulmonary embolus  Allergies  Allergen Reactions   Antihistamines, Diphenhydramine-Type    Bactrim [Sulfamethoxazole-Trimethoprim] Nausea Only   Chamomile Other (See Comments)   Diphenhydramine Hcl Other (See Comments)    dizziness   Gabapentin    Levomenol    Pseudoephedrine Hcl Other (See Comments)    dizziness   Rosuvastatin    Triprolidine Hcl Other (See Comments)    dizziness    Patient Measurements: Height: 5\' 7"  (170.2 cm) Weight: 90.7 kg (199 lb 15.3 oz) IBW/kg (Calculated) : 66.1 Heparin Dosing Weight: 85 kg   Vital Signs: BP: 120/58 (06/11 0630) Pulse Rate: 96 (06/11 0630)  Labs: Recent Labs    09/20/20 1710 09/20/20 1910 09/21/20 0500  HGB 9.6*  --  9.2*  HCT 28.7*  --  28.1*  PLT 210  --  209  APTT 39*  --   --   LABPROT 17.7*  --  16.7*  INR 1.5*  --  1.4*  HEPARINUNFRC  --   --  <0.10*  CREATININE 0.47*  --  0.46*  TROPONINIHS 9 7  --      Estimated Creatinine Clearance: 79.1 mL/min (A) (by C-G formula based on SCr of 0.46 mg/dL (L)).   Medical History: Past Medical History:  Diagnosis Date   Acute urinary retention 09/04/2020   Chronic venous stasis 05/28/2017   Colon cancer ([place])    reported by patient and wife   Diarrhea 09/04/2020   Fracture    right forearm, tibia/fibula   Gait instability 05/28/2017   Heart murmur, systolic 4/76/5465   Hernia, abdominal    History of diabetes mellitus 05/28/2017   Hypertension    Hypokalemia 08/29/2020   Hyponatremia 09/04/2020   Malnutrition of moderate degree 08/30/2020   Obesity (BMI 30-39.9) 05/28/2017   Pressure injury of skin 08/30/2020    Medications:  No prior anticoagulation noted  Assessment: 80 y.o. male presents from a long-term care facility via EMS complaining of of shortness. Found to have bilateral pulmonary emboli on CT. Pharmacy has been consulted for initiation and management of  heparin infusion for PE.  Baseline aPTT 36, INR 1.5   Goal of Therapy:  Heparin level 0.3-0.7 units/ml Monitor platelets by anticoagulation protocol: Yes   Plan:  6/11:  HL @ 0500 = < 0.1 Will order Heparin 2500 units IV X 1 and increase drip rate to 1650 units/hr.  Will recheck HL 8 hrs after rate change.   Orene Desanctis, PharmD Clinical Pharmacist   09/21/2020,7:11 AM

## 2020-09-21 NOTE — ED Notes (Signed)
Pt resting in bed at this time. Respirations even and unlabored.

## 2020-09-21 NOTE — Progress Notes (Signed)
Pt alert and oriented. Pt chronic foley had minimal output upon arrival to the floor. Daughter stated that he had hardly any output today which was not typical. This nurse flushed foley with saline and the urine was released into bag, 566ml, tea colored, foul smelling. Daughter at bedside.

## 2020-09-21 NOTE — ED Notes (Signed)
Pt had another incontinent bowel episode. Cleaned and repositioned

## 2020-09-22 ENCOUNTER — Inpatient Hospital Stay: Payer: PPO

## 2020-09-22 DIAGNOSIS — G959 Disease of spinal cord, unspecified: Secondary | ICD-10-CM

## 2020-09-22 LAB — BASIC METABOLIC PANEL
Anion gap: 3 — ABNORMAL LOW (ref 5–15)
BUN: 25 mg/dL — ABNORMAL HIGH (ref 8–23)
CO2: 30 mmol/L (ref 22–32)
Calcium: 7.4 mg/dL — ABNORMAL LOW (ref 8.9–10.3)
Chloride: 91 mmol/L — ABNORMAL LOW (ref 98–111)
Creatinine, Ser: 0.47 mg/dL — ABNORMAL LOW (ref 0.61–1.24)
GFR, Estimated: >60 mL/min (ref >60)
Glucose, Bld: 180 mg/dL — ABNORMAL HIGH (ref 70–99)
Potassium: 3.7 mmol/L (ref 3.5–5.1)
Sodium: 124 mmol/L — ABNORMAL LOW (ref 135–145)

## 2020-09-22 LAB — CK: Total CK: 9 U/L — ABNORMAL LOW (ref 49–397)

## 2020-09-22 LAB — CBC
HCT: 25.8 % — ABNORMAL LOW (ref 39.0–52.0)
Hemoglobin: 8.4 g/dL — ABNORMAL LOW (ref 13.0–17.0)
MCH: 27.2 pg (ref 26.0–34.0)
MCHC: 32.6 g/dL (ref 30.0–36.0)
MCV: 83.5 fL (ref 80.0–100.0)
Platelets: 202 10*3/uL (ref 150–400)
RBC: 3.09 MIL/uL — ABNORMAL LOW (ref 4.22–5.81)
RDW: 16.7 % — ABNORMAL HIGH (ref 11.5–15.5)
WBC: 11.5 10*3/uL — ABNORMAL HIGH (ref 4.0–10.5)
nRBC: 0 % (ref 0.0–0.2)

## 2020-09-22 LAB — HEPARIN LEVEL (UNFRACTIONATED)
Heparin Unfractionated: <0.1 IU/mL — ABNORMAL LOW (ref 0.30–0.70)
Heparin Unfractionated: <0.1 IU/mL — ABNORMAL LOW (ref 0.30–0.70)
Heparin Unfractionated: 0.67 IU/mL (ref 0.30–0.70)

## 2020-09-22 LAB — C DIFFICILE QUICK SCREEN W PCR REFLEX
C Diff antigen: NEGATIVE
C Diff interpretation: NOT DETECTED
C Diff toxin: NEGATIVE

## 2020-09-22 LAB — URINE CULTURE

## 2020-09-22 IMAGING — MR MR HEAD WO/W CM
10 of 13 series · 33 of 48 positions shown · IV contrast (7.5ml Gadavist)
Comparison: None available.

CLINICAL DATA: Initial evaluation for metastatic disease
evaluation.

EXAM:
MRI HEAD WITHOUT AND WITH CONTRAST
TECHNIQUE: Multiplanar, multiecho pulse sequences of the brain and surrounding
structures were obtained without and with intravenous contrast.
CONTRAST:  7.5mL GADAVIST GADOBUTROL 1 MMOL/ML IV SOLN

[Series 2: ax dwi_tracew · axial · 3.0mm · 0.71mm/px · z∈[+107,+246]mm · 5 of 52 slices shown]
[im 1/52]
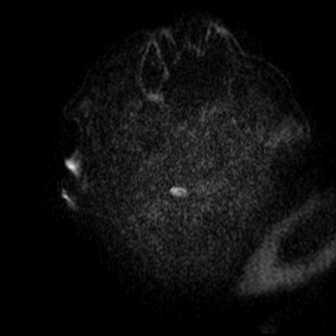
[im 13/52]
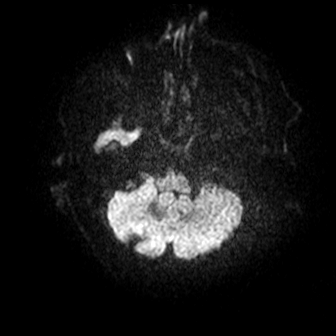
[im 26/52]
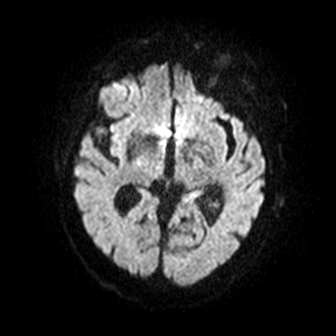
[im 39/52]
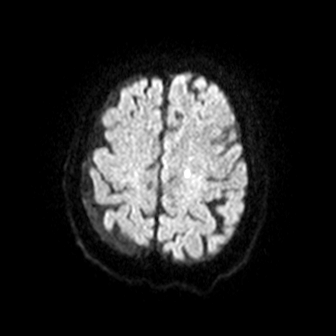
[im 52/52]
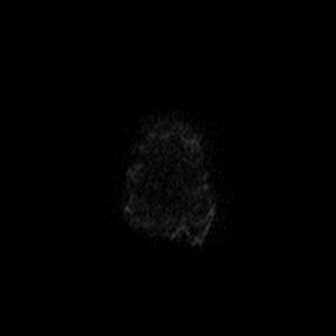

[Series 3: ax dwi_adc · axial · 3.0mm · 0.71mm/px · z∈[+107,+134]mm · 2 of 52 slices shown]
[im 1/52]
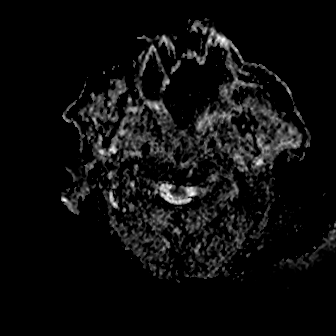
[im 11/52]
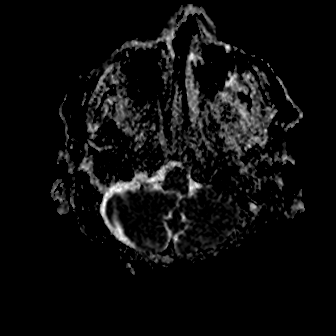

[Series 6: T1 · sagittal · 5.0mm · 0.47mm/px · 3 of 22 slices shown (1 of 2)]
[im 1/22]
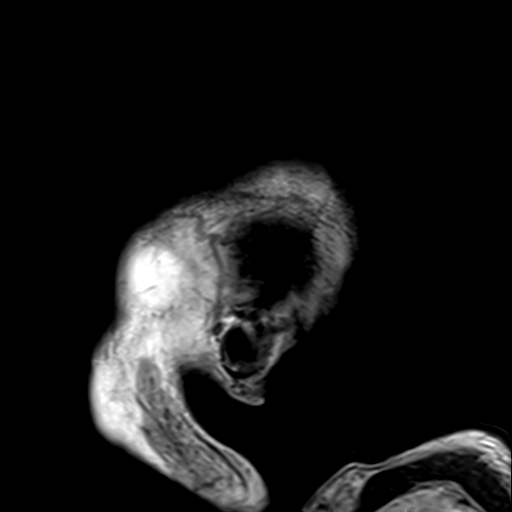
[im 11/22]
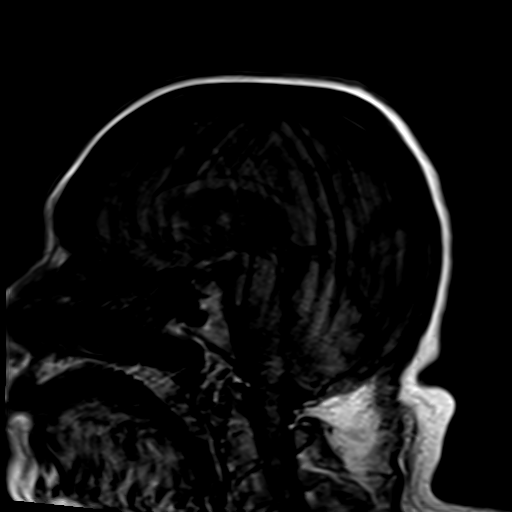
[im 22/22]
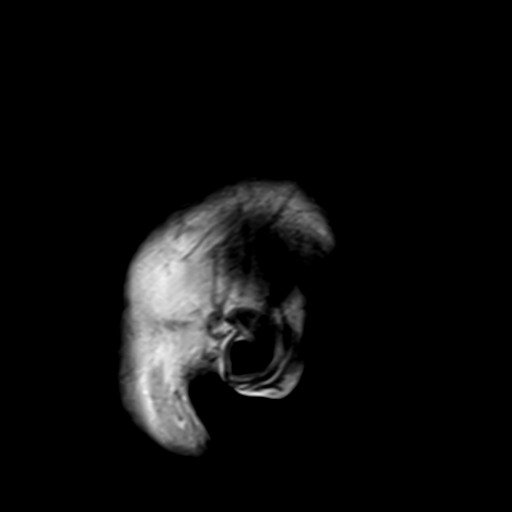

[Series 7: T2 · axial · 5.0mm · 0.45mm/px · z∈[+98,+240]mm · 3 of 27 slices shown (1 of 2)]
[im 1/27]
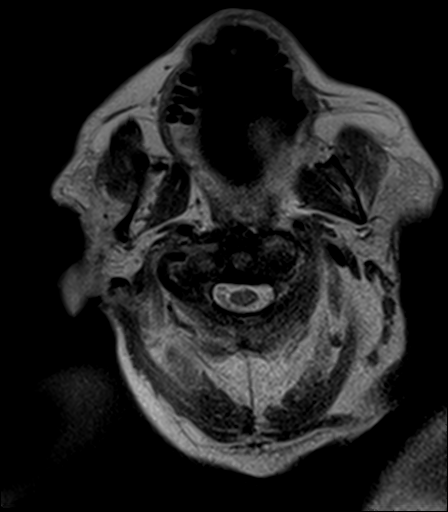
[im 14/27]
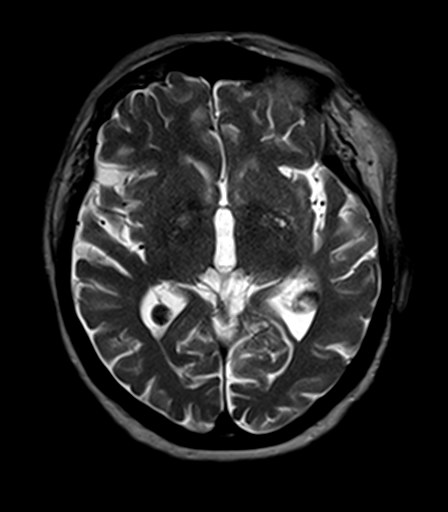
[im 27/27]
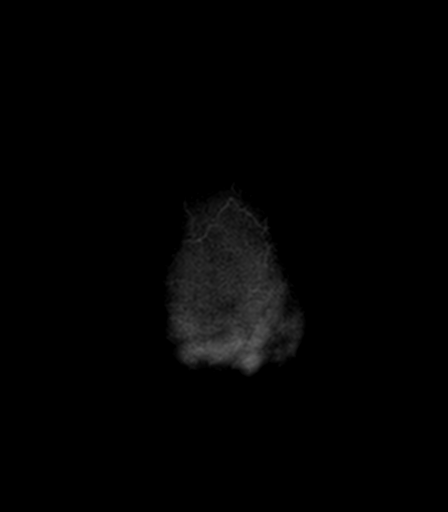

[Series 9: FLAIR · axial · 5.0mm · 1.20mm/px · z∈[+102,+245]mm · 3 of 27 slices shown]
[im 1/27]
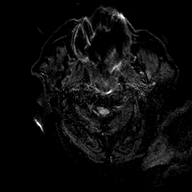
[im 14/27]
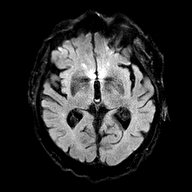
[im 27/27]
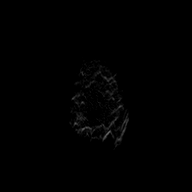

[Series 10: T1 · axial · 5.0mm · 0.90mm/px · z∈[+98,+240]mm · 3 of 27 slices shown (2 of 2)]
[im 1/27]
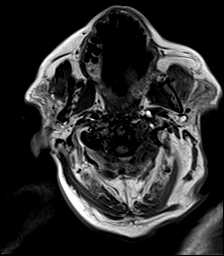
[im 14/27]
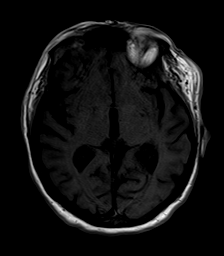
[im 27/27]
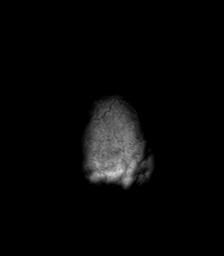

[Series 11: T2 · coronal · 5.0mm · 0.45mm/px · 4 of 31 slices shown (2 of 2)]
[im 1/31]
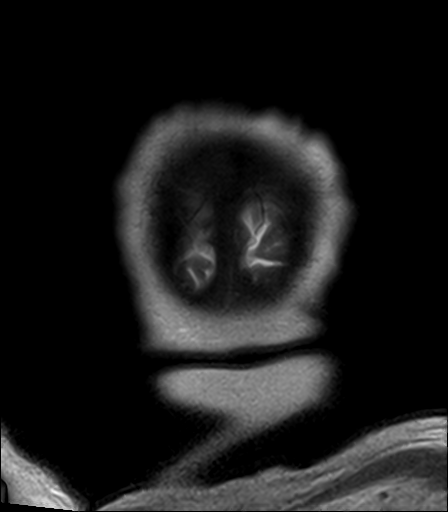
[im 11/31]
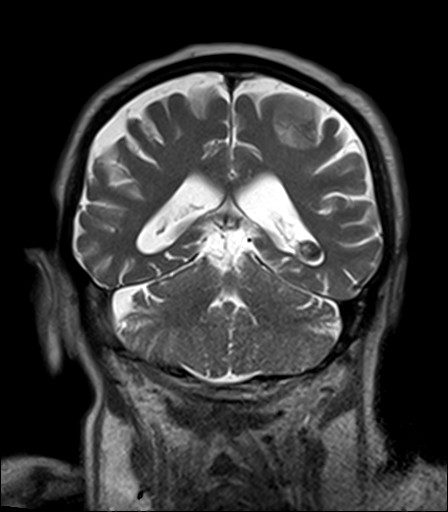
[im 21/31]
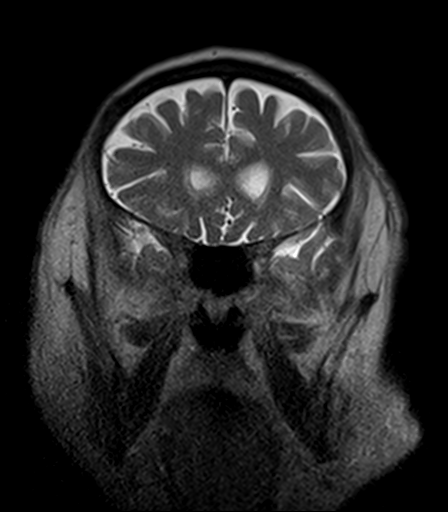
[im 31/31]
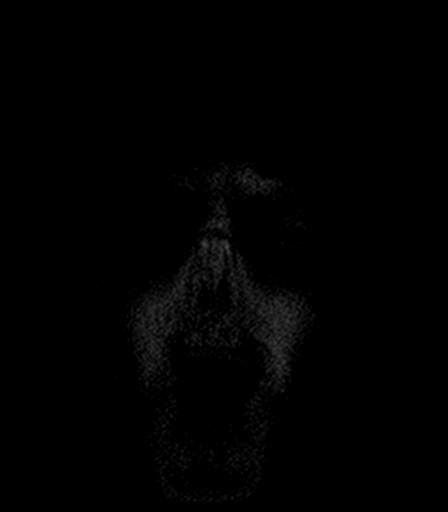

[Series 12: T1 post-contrast · axial · 5.0mm · 0.90mm/px · z∈[+98,+240]mm · 3 of 27 slices shown (1 of 3)]
[im 1/27]
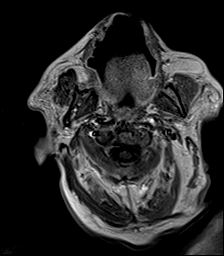
[im 14/27]
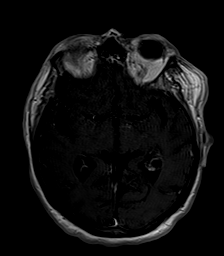
[im 27/27]
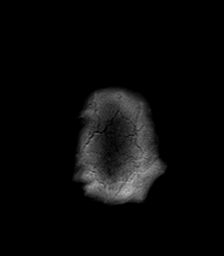

[Series 13: T1 post-contrast · coronal · 5.0mm · 0.90mm/px · 4 of 31 slices shown (2 of 3)]
[im 1/31]
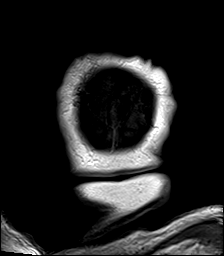
[im 11/31]
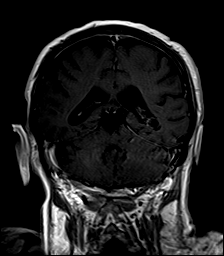
[im 21/31]
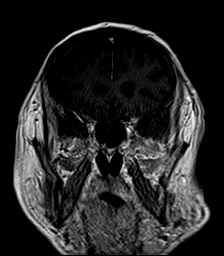
[im 31/31]
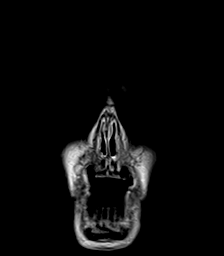

[Series 14: T1 post-contrast · sagittal · 5.0mm · 0.47mm/px · 3 of 22 slices shown (3 of 3)]
[im 1/22]
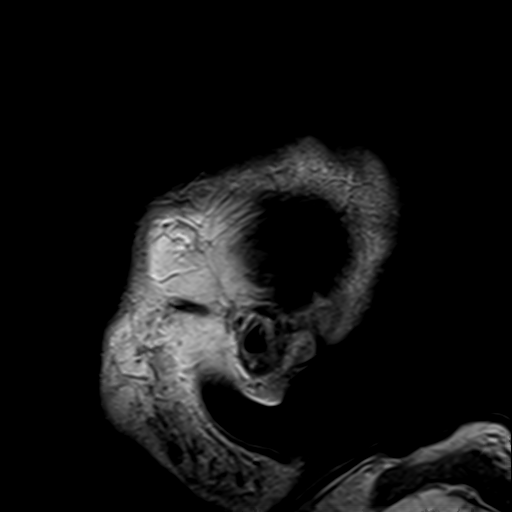
[im 11/22]
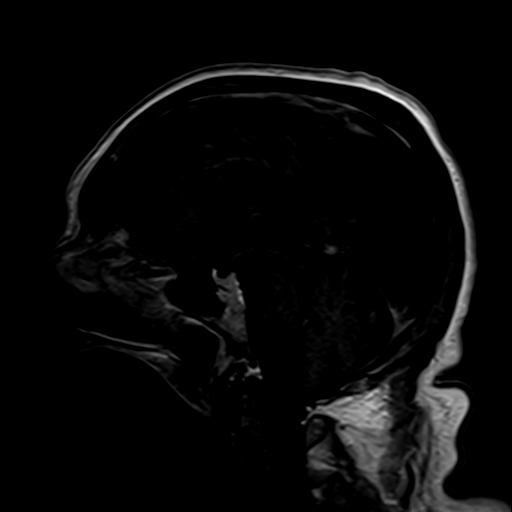
[im 22/22]
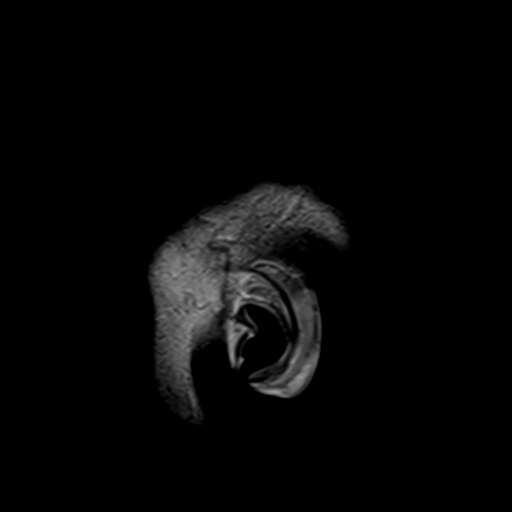

[33 of 48 positions shown; findings below may reference images not displayed]

FINDINGS: Brain: Diffuse prominence of the CSF containing spaces compatible
with generalized cerebral atrophy. Focal prominence of the
subarachnoid space present at the left parietal convexity (series 7,
image 22), nonspecific, but suspected to reflect an underlying
cortical dysplasia, likely chronic and congenital. No associated
FLAIR or susceptibility artifact.

9 mm focus of diffusion abnormality seen involving the subcortical
aspect of the left frontal centrum semi ovale (series 2, image 39).
Associated FLAIR signal abnormality without definite ADC correlate.
No associated enhancement. Finding favored to reflect an evolving
subacute white matter infarct. No associated hemorrhage or mass
effect.

No other diffusion abnormality to suggest acute or subacute
ischemia. Gray-white matter differentiation otherwise maintained. No
other areas of encephalomalacia to suggest chronic cortical
infarction. No foci of susceptibility artifact to suggest acute or
chronic intracranial hemorrhage.

No mass lesion, midline shift or mass effect. Mild ventricular
prominence related global parenchymal volume loss without
hydrocephalus. No extra-axial fluid collection.

Pituitary gland and suprasellar region within normal limits. Midline
structures intact and within normal limits.

No abnormal enhancement or evidence for intracranial metastatic
disease.

Vascular: Major intracranial vascular flow voids are maintained.

Skull and upper cervical spine: Craniocervical junction within
normal limits. Bone marrow signal intensity within normal limits.
Probable small benign hemangioma noted within the left lateral mass
of C1. No other focal marrow replacing lesions. No scalp soft tissue
abnormality.

Sinuses/Orbits: Patient status post bilateral ocular lens
replacement. Globes and orbital soft tissues demonstrate no acute
finding. Mild scattered mucosal thickening noted within the
ethmoidal air cells. Paranasal sinuses are otherwise largely clear.
Small bilateral mastoid effusions. Visualized nasopharynx is
unremarkable. Inner ear structures grossly normal.

Other: None.
IMPRESSION: 1. 9 mm focus of diffusion abnormality involving the subcortical
aspect of the left frontal centrum semi ovale, favored to reflect an
evolving subacute white matter infarct. No associated hemorrhage or
mass effect.
2. No other acute intracranial abnormality. No evidence for
intracranial metastatic disease.
3. Focal prominence of the subarachnoid space at the left parietal
convexity, nonspecific, but suspected to reflect an underlying
cortical dysplasia, likely chronic and congenital.

## 2020-09-22 IMAGING — MR MR THORACIC SPINE WO/W CM
7 of 9 series · 31 of 48 positions shown · IV contrast (gadavist)
Comparison: None available.

CLINICAL DATA: Initial evaluation for myelopathy. History of
colorectal cancer.

EXAM:
MRI THORACIC WITHOUT AND WITH CONTRAST
TECHNIQUE: Multiplanar and multiecho pulse sequences of the thoracic spine were
obtained without and with intravenous contrast.
CONTRAST:  7.5mL GADAVIST GADOBUTROL 1 MMOL/ML IV SOLN

[Series 16: T1 · sagittal · 5.0mm · 1.88mm/px · 1 of 9 slices shown (1 of 2)]
[im 1/9]
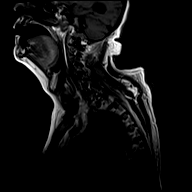

[Series 17: T2 · sagittal · 3.0mm · 1.33mm/px · 4 of 19 slices shown (1 of 2)]
[im 1/19]
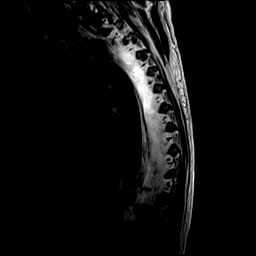
[im 7/19]
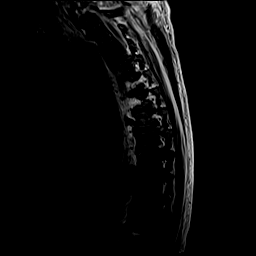
[im 13/19]
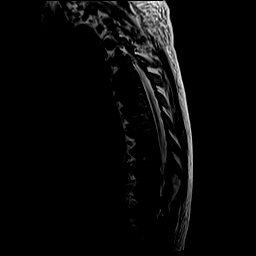
[im 19/19]
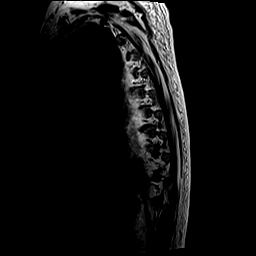

[Series 18: T1 · sagittal · 3.0mm · 1.33mm/px · 4 of 19 slices shown (2 of 2)]
[im 1/19]
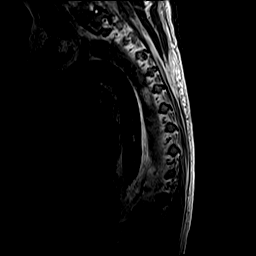
[im 7/19]
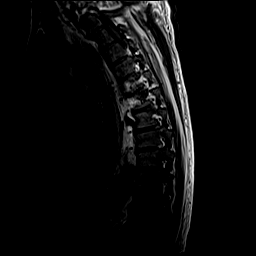
[im 13/19]
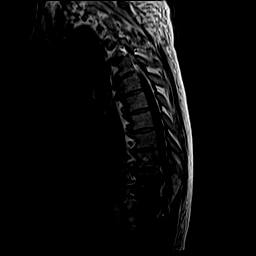
[im 19/19]
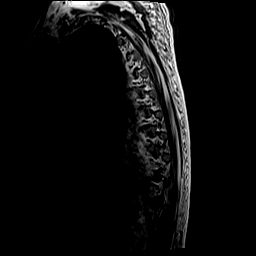

[Series 19: STIR · sagittal · 3.0mm · 0.66mm/px · 4 of 19 slices shown]
[im 1/19]
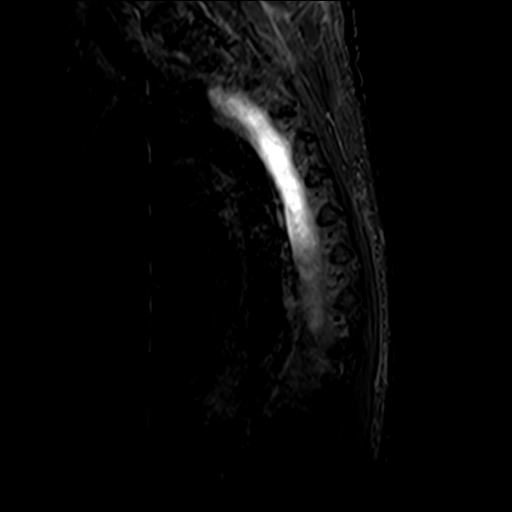
[im 7/19]
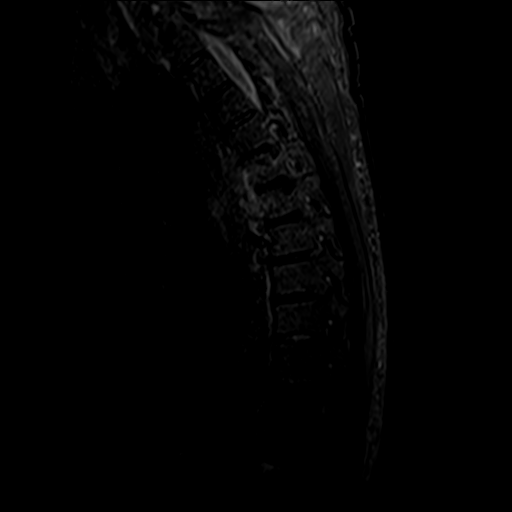
[im 13/19]
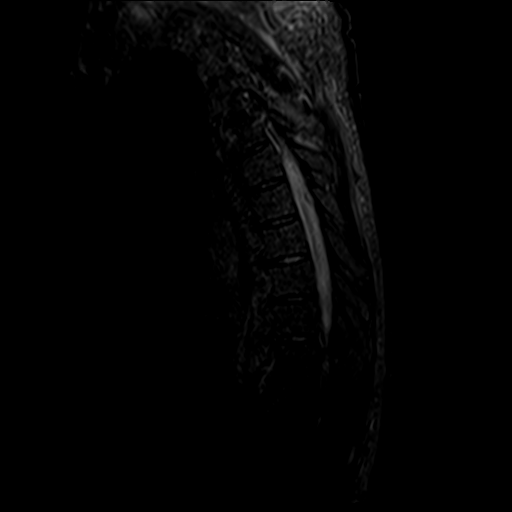
[im 19/19]
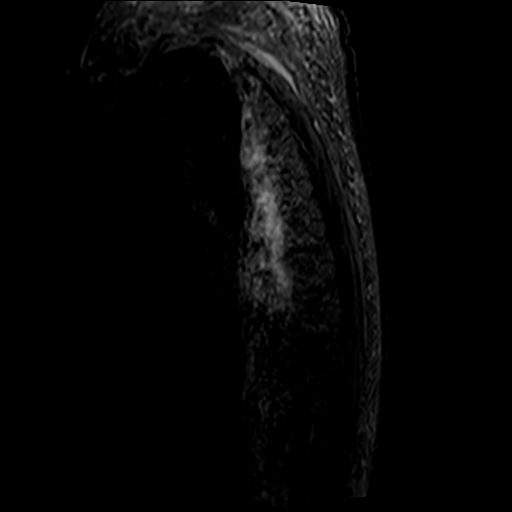

[Series 20: T2 · axial · 4.0mm · 0.59mm/px · z∈[-336,-111]mm · 8 of 39 slices shown (2 of 2)]
[im 1/39]
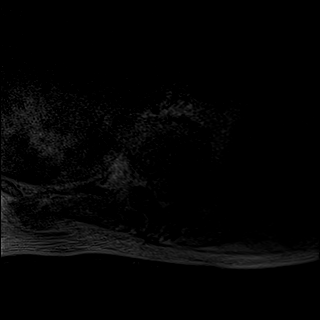
[im 6/39]
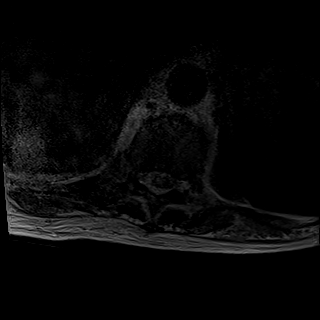
[im 11/39]
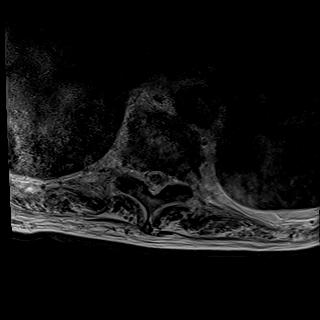
[im 17/39]
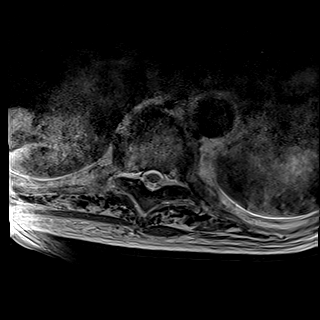
[im 22/39]
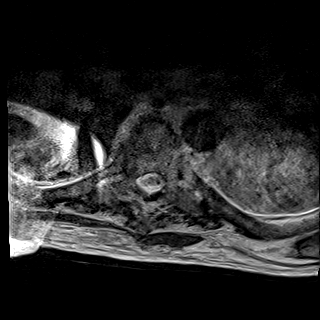
[im 28/39]
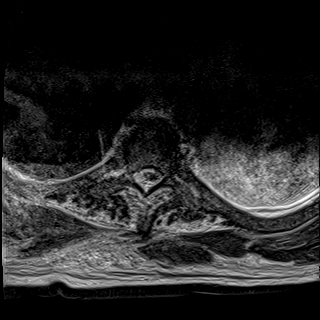
[im 33/39]
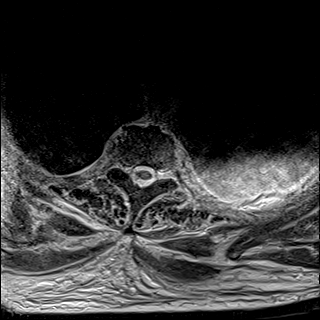
[im 39/39]
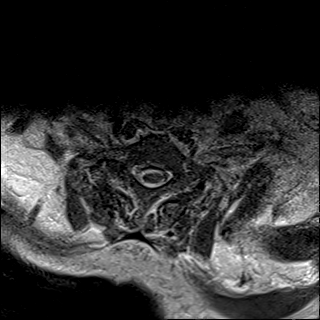

[Series 22: T1 post-contrast · axial · non-contrast · 4.0mm · 0.37mm/px · z∈[-336,-142]mm · 7 of 39 slices shown]
[im 1/39]
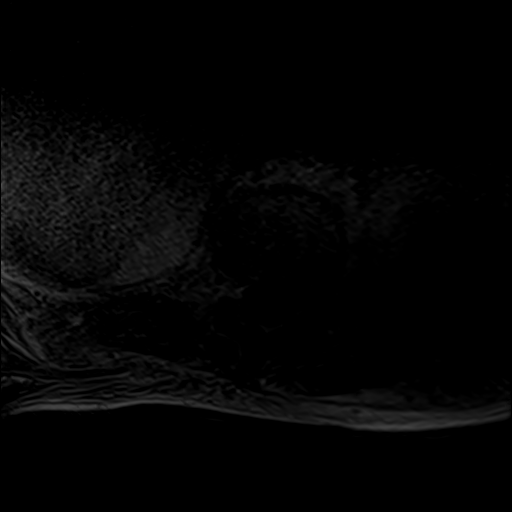
[im 6/39]
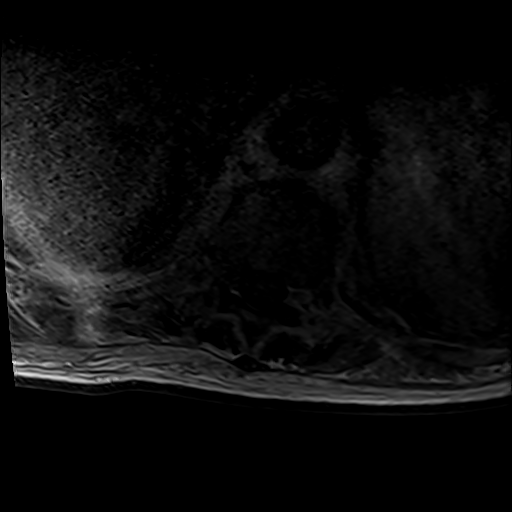
[im 11/39]
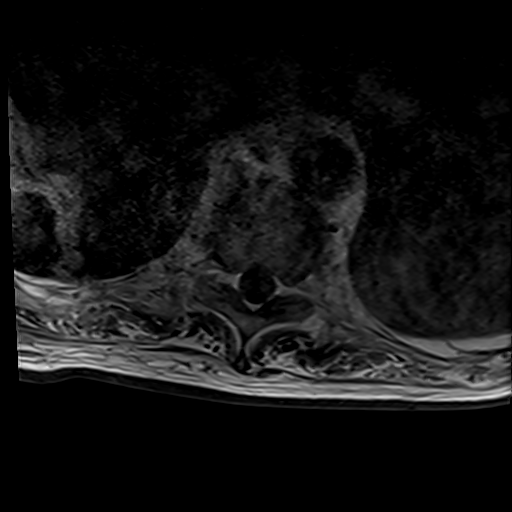
[im 17/39]
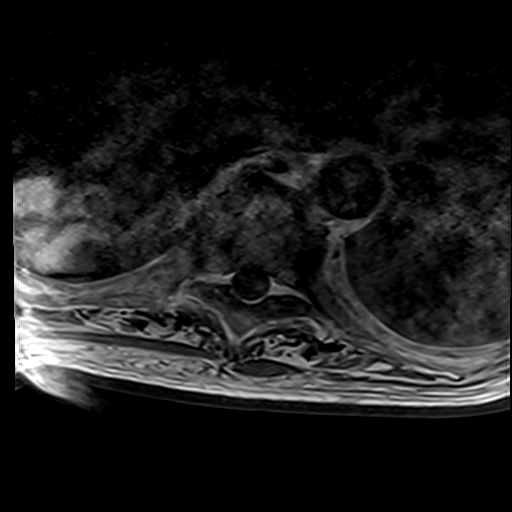
[im 22/39]
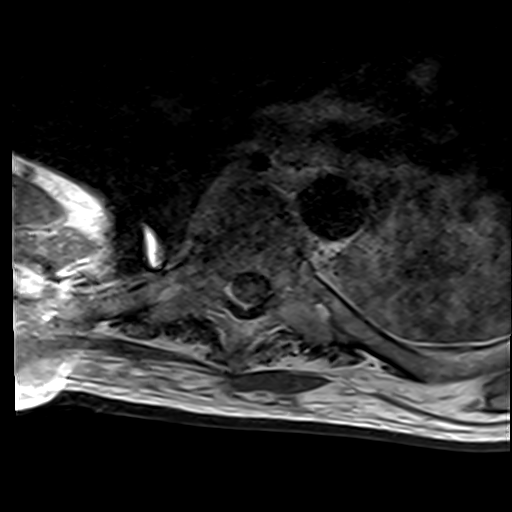
[im 28/39]
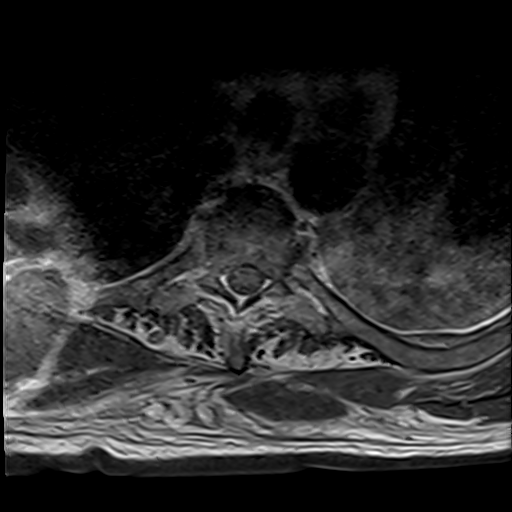
[im 33/39]
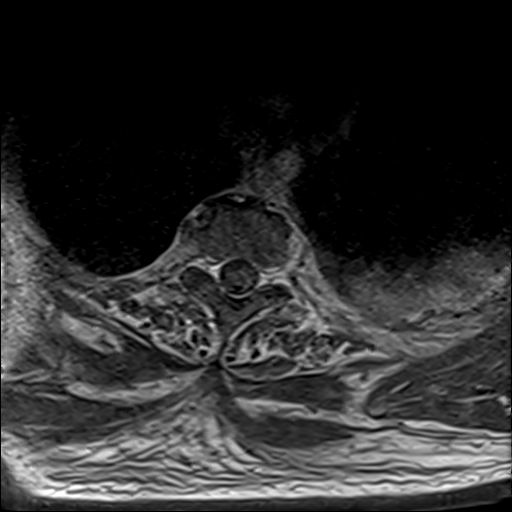

[Series 23: T1 fat-sat post-contrast · sagittal · 3.0mm · 1.33mm/px · 3 of 17 slices shown]
[im 1/17]
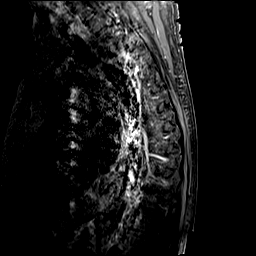
[im 9/17]
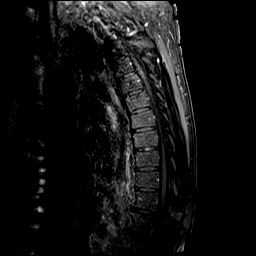
[im 17/17]
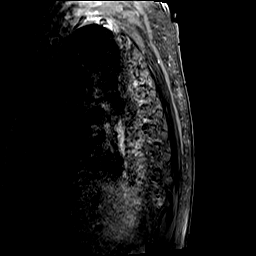

[31 of 48 positions shown; findings below may reference images not displayed]

FINDINGS: Alignment: Mild dextroscoliosis with exaggeration of the normal
thoracic kyphosis. No listhesis.

Vertebrae: Mild chronic height loss with associated Schmorl's node
deformity noted at the superior endplate of L1. Vertebral body
height otherwise maintained without acute fracture. Underlying bone
marrow signal intensity mildly decreased on T1 weighted imaging,
nonspecific, but most commonly related to anemia, smoking, or
obesity. Few scattered T1/T2 hyperintense lesions noted, most
characteristic of a small benign hemangiomata. Largest of these
measures 7 mm at the T6 vertebral body. No other discrete or
worrisome osseous lesions. No abnormal marrow edema or enhancement.

Cord: Signal intensity within the thoracic spinal cord is grossly
within normal limits on this technically limited exam. No convincing
cord signal abnormality or abnormal enhancement.

Paraspinal and other soft tissues: Mild diffuse edema within the
posterior paraspinous soft tissues of the upper back. Large layering
bilateral pleural effusions. Paraspinous soft tissues demonstrate no
other acute finding.

Disc levels:

No significant disc pathology seen within the thoracic spine for
age. No significant disc bulge or focal disc herniation. Mild for
age lower thoracic facet hypertrophy. No significant spinal
stenosis. Foramina appear patent. No impingement.
IMPRESSION: 1. Technically limited exam due to motion artifact.
2. Grossly normal MRI appearance of the thoracic spinal cord. No
cord signal changes to suggest myelopathy. No evidence for
metastatic disease.
3. No significant disc pathology or stenosis. No evidence for neural
impingement.
4. Large layering bilateral pleural effusions.
5. Mild diffuse edema within the posterior paraspinous soft tissues
of the upper back, nonspecific, but could be related to overall
volume status.

## 2020-09-22 IMAGING — MR MR CERVICAL SPINE WO/W CM
5 of 8 series · 27 of 48 positions shown · IV contrast (gadavist)
Comparison: None available.

CLINICAL DATA: Initial evaluation for myelopathy. History of
colorectal cancer.

EXAM:
MRI CERVICAL SPINE WITHOUT AND WITH CONTRAST
TECHNIQUE: Multiplanar and multiecho pulse sequences of the cervical spine, to
include the craniocervical junction and cervicothoracic junction,
were obtained without and with intravenous contrast.
CONTRAST:  7.5mL GADAVIST GADOBUTROL 1 MMOL/ML IV SOLN

[Series 1: T2 · sagittal · 3.0mm · 0.62mm/px · 4 of 15 slices shown (1 of 2)]
[im 1/15]
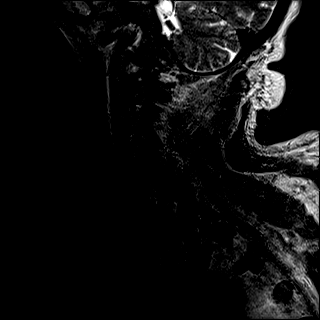
[im 5/15]
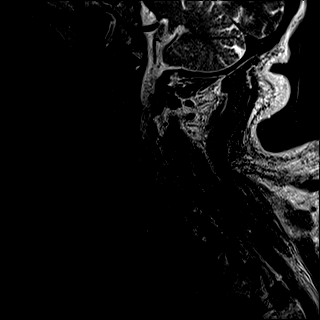
[im 10/15]
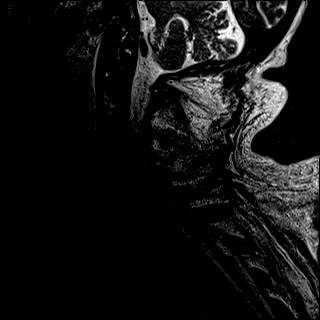
[im 15/15]
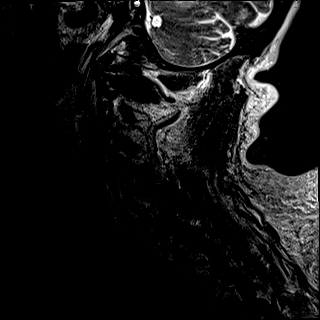

[Series 3: STIR · sagittal · 3.0mm · 0.62mm/px · 4 of 15 slices shown]
[im 1/15]
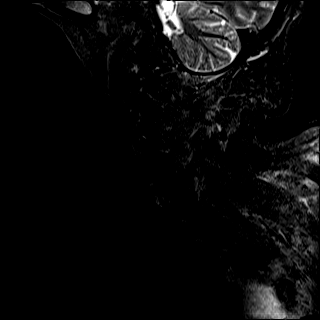
[im 5/15]
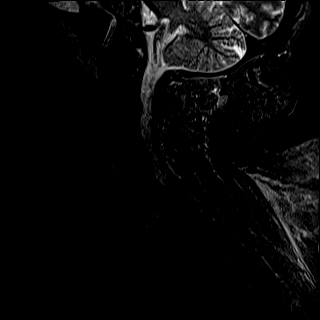
[im 10/15]
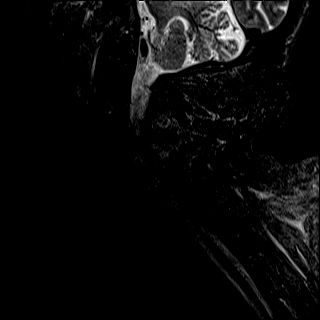
[im 15/15]
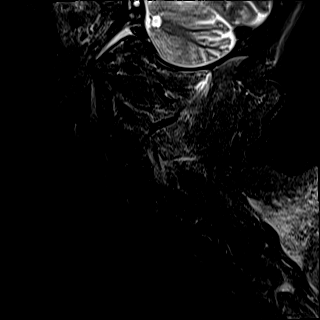

[Series 4: T2 · axial · 3.0mm · 0.70mm/px · z∈[-118,-22]mm · 8 of 33 slices shown (2 of 2)]
[im 1/33]
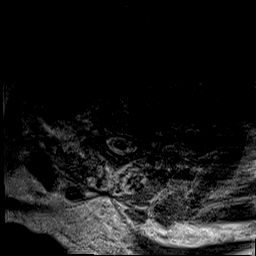
[im 5/33]
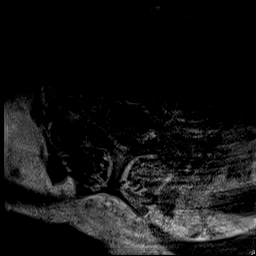
[im 10/33]
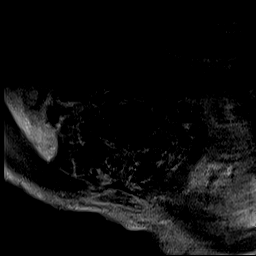
[im 14/33]
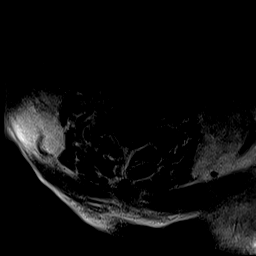
[im 19/33]
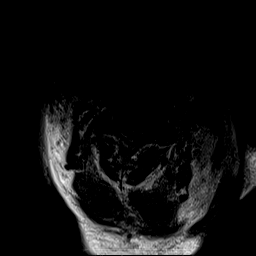
[im 23/33]
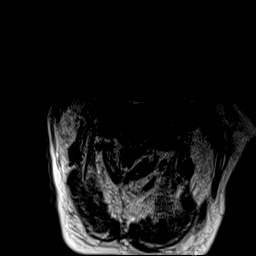
[im 28/33]
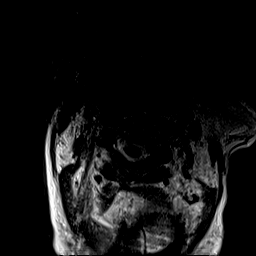
[im 33/33]
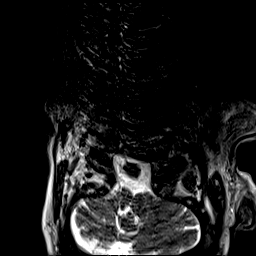

[Series 6: T1 · axial · non-contrast · 3.0mm · 0.35mm/px · z∈[-118,-22]mm · 8 of 33 slices shown]
[im 1/33]
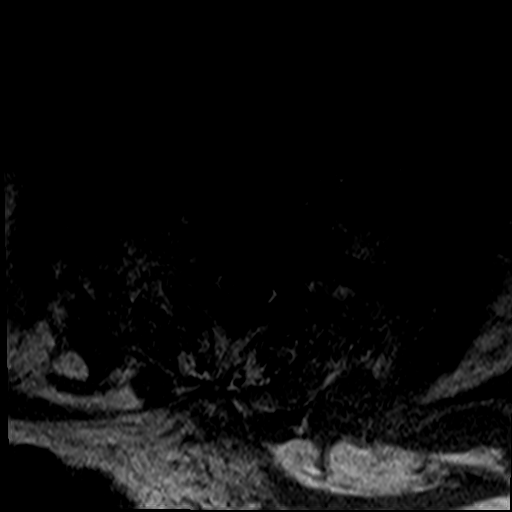
[im 5/33]
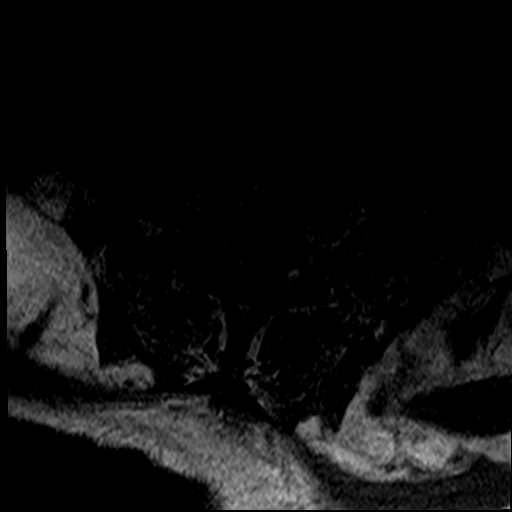
[im 10/33]
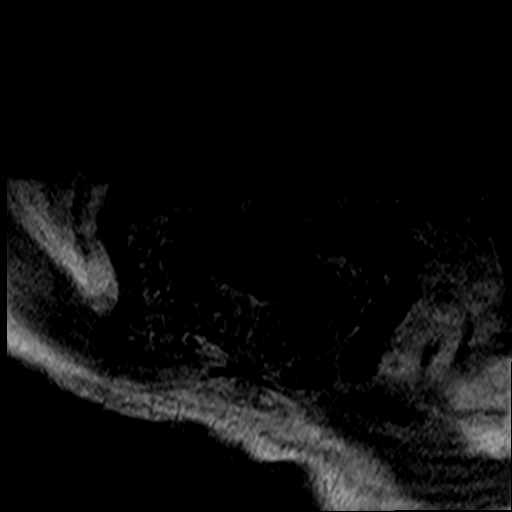
[im 14/33]
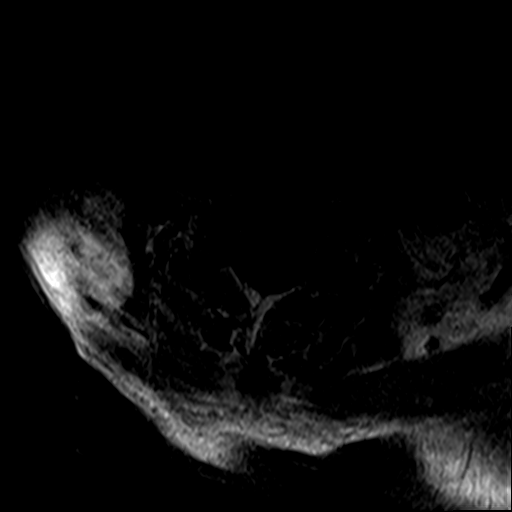
[im 19/33]
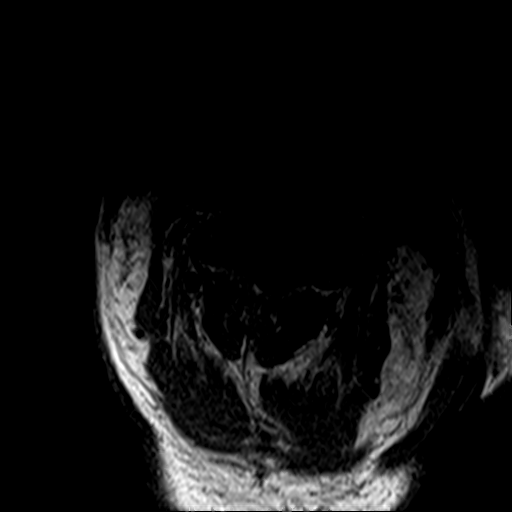
[im 23/33]
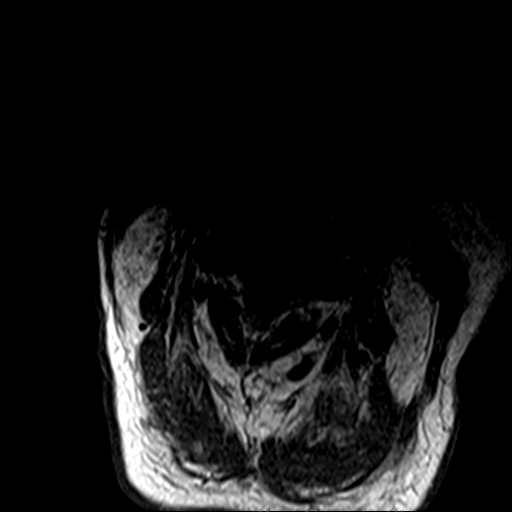
[im 28/33]
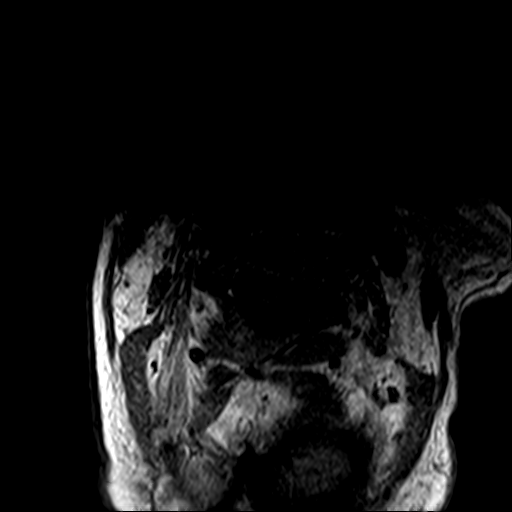
[im 33/33]
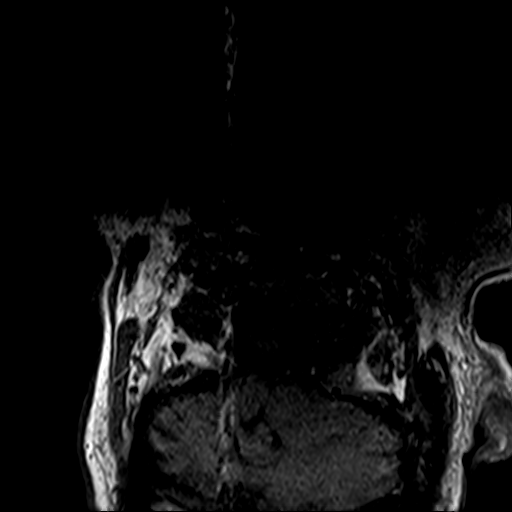

[Series 8: T1 post-contrast · axial · 3.0mm · 0.35mm/px · z∈[-118,-91]mm · 3 of 33 slices shown]
[im 1/33]
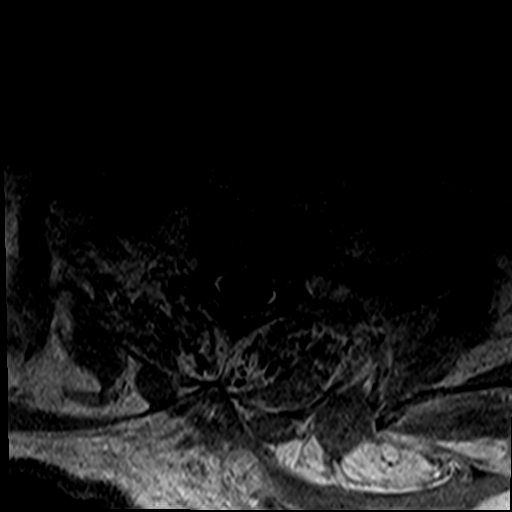
[im 5/33]
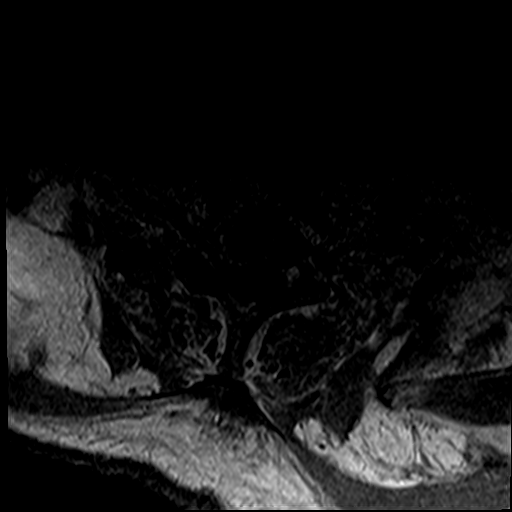
[im 10/33]
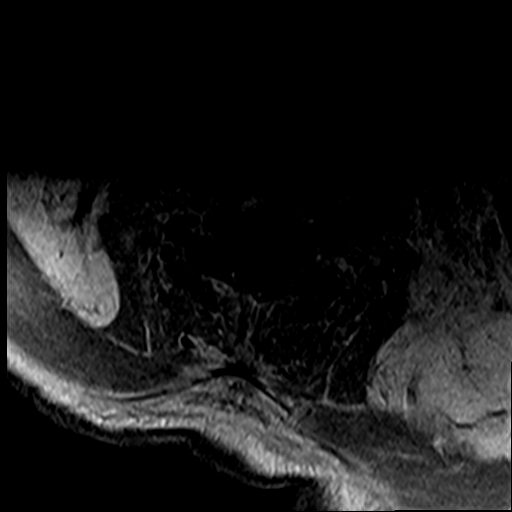

[27 of 48 positions shown; findings below may reference images not displayed]

FINDINGS: Alignment: Examination moderately degraded by motion artifact.

Straightening of the normal cervical lordosis. No significant
listhesis.

Vertebrae: Vertebral body height maintained without acute or chronic
fracture. Bone marrow signal intensity somewhat diffusely decreased
on T1 weighted imaging, nonspecific, but most commonly related to
anemia, smoking, or obesity. Single 8 mm T1/T2 hyperintense lesion
partially visualized within the left lateral mass of C1,
nonspecific, but favored to reflect an atypical hemangioma. No other
discrete or worrisome osseous lesions. No other abnormal marrow
edema or enhancement.

Cord: Signal intensity within the cervical spinal cord is grossly
within normal limits on this motion degraded exam. No convincing
cord signal change or abnormal enhancement.

Posterior Fossa, vertebral arteries, paraspinal tissues:
Craniocervical junction within normal limits. Diffuse edema noted
within the posterior paraspinous soft tissues of the partially
visualized upper back, mild specific, but could be related to
overall volume status. No visible loculated collections.
Prevertebral soft tissues within normal limits. Normal flow voids
seen within the vertebral arteries bilaterally.

Disc levels:

C2-C3: Left eccentric disc bulge with associated left-sided uncinate
spurring. Mild bilateral facet hypertrophy. No significant spinal
stenosis. Moderate to severe left C3 foraminal stenosis. Right
neural foramina remains patent.

C3-C4: Disc bulge with bilateral uncovertebral hypertrophy.
Left-sided facet degeneration. Mild spinal stenosis. Severe
bilateral C4 foraminal narrowing.

C4-C5: Disc bulge with uncovertebral hypertrophy. Secondary
flattening and partial effacement of the ventral thecal sac,
slightly asymmetric to the right. Superimposed facet degeneration.
Resultant moderate spinal stenosis. Severe left worse than right C5
foraminal narrowing.

C5-C6: Degenerative intervertebral disc space narrowing with diffuse
disc bulge, with uncovertebral and endplate spurring. Posterior disc
osteophyte flattens and effaces the ventral thecal sac. Moderate
spinal stenosis with mild cord flattening, but no definite cord
signal changes. Superimposed mild facet hypertrophy. Severe right
worse than left C6 foraminal narrowing.

C6-C7: Diffuse disc bulge with uncovertebral and endplate spurring,
asymmetric to the right. Flattening and effacement of the ventral
thecal sac. Resultant moderate to severe spinal stenosis. Thecal sac
measures 7 mm in AP diameter. Mild cord flattening without definite
cord signal changes. Severe right worse than left C7 foraminal
narrowing.

C7-T1: Grossly negative interspace. No visible spinal stenosis.
Foramina appear patent.
IMPRESSION: 1. Technically limited exam due to extensive motion artifact.
2. Normal MRI appearance of the cervical spinal cord. No cord signal
changes to suggest myelopathy. No evidence for metastatic disease
within the cervical spine. Multilevel cervical spondylosis with
resultant diffuse spinal stenosis at C3-4 through C6-7, moderate to
severe in nature at the C6-7 level. Associated severe bilateral C4
through C7 foraminal stenosis.
3. Diffuse edema within the posterior paraspinous soft tissues of
the partially visualized upper back, nonspecific, but could be
related to overall volume status.

## 2020-09-22 MED ORDER — GADOBUTROL 1 MMOL/ML IV SOLN
7.5000 mL | Freq: Once | INTRAVENOUS | Status: AC | PRN
  Administered 2020-09-23: 7.5 mL via INTRAVENOUS

## 2020-09-22 MED ORDER — HEPARIN BOLUS VIA INFUSION
2500.0000 [IU] | Freq: Once | INTRAVENOUS | Status: AC
  Administered 2020-09-22: 2500 [IU] via INTRAVENOUS
  Filled 2020-09-22: qty 2500

## 2020-09-22 MED ORDER — VITAMIN B-12 1000 MCG PO TABS
1000.0000 ug | ORAL_TABLET | Freq: Every day | ORAL | Status: DC
  Administered 2020-09-23 – 2020-09-27 (×4): 1000 ug via ORAL
  Filled 2020-09-22 (×5): qty 1

## 2020-09-22 NOTE — Consult Note (Addendum)
Neurology Consultation Reason for Consult: Spell concerning for seizure 2 days prior to admission Requesting Physician: Nolberto Hanlon  CC: Progressive vision loss and an episode of loss of consciousness  History is obtained from: Primary team, chart review and patient and daughter at bedside.  HPI: Keith Reynolds is a 80 y.o. male with a past medical history of recently diagnosed colon cancer (C/B diarrhea, hypokalemia, hyponatremia, recent weight loss, and acute urinary retention), hypertension, hyperlipidemia, diabetes, obesity (BMI 31.32).  Neurology is asked to: For an episode of loss of consciousness that occurred 2 days prior to his current admission (admitted 6/10, event occurred approximately 6/8).  He had been working in rehab to recover from bilateral lower extremity weakness; his last prior admission was on 5/19 and due to bilateral leg pain and an acute fall with his legs giving out on him.  Weakness was attributed to electrolyte derangements, anemia, and general medical condition.  He was found to have acute urinary retention for which Foley catheter was placed and was felt to be secondary to bladder outlet obstruction secondary to his malignancy.  He had bilateral lower extremity edema with lower extremity ultrasound negative for DVT and echocardiogram showing a normal EF of 60 to 65% with grade 1 diastolic dysfunction.  Unfortunately he will not making much progress in physical therapy, although he felt like he was walking further and further each day with physical therapy his daughter feels like his bed mobility and ability to assist with transfers was actually declining throughout his stay.  His most recent presentation was in the setting of fever to 101.2 with O2 saturations of 80% on room air.  He has been have bilateral pulmonary emboli and met sepsis criteria for which he was treated with fluids, cefepime, Flagyl.  Daughter was present for this episode and patient given a very  similar history.  They describe that he had just been walking with physical therapy and had sat down for the rest.  He denies feeling any prodrome but knows he lost consciousness.  Daughter feels episode lasted for about 2 minutes and was associated with slumping over with some bilateral mild tremor in his hands and stiffening of his legs.  Immediately on regaining consciousness he was back to his normal self without any postevent confusion.  There was no clear incontinence (though note Foley catheter at baseline and chronic diarrhea), no tongue bite.  He has no history of seizures, no family history of seizures, no history of meningitis/cerebritis, no history of significant TBI/loss of consciousness secondary to head strike.  He is additionally concerned that his vision has been gradually declining over the past few weeks at least.  He reports he is initially able to read writing on paper fairly normally, then noticed he could only tell that words were there, and now cannot make out any writing at all but can only see that there is a piece of paper.  Similarly he is having a great deal of trouble reading clocks because he is unable to resolve the hands of the clock, can typically only resolve 1 of the 2 or 3 hands.   ROS: All other review of systems was negative except as noted in the HPI.  Notably he denies any double vision, he does note that he has been having some floaters that have been stable going from top to bottom for many months now.  Both patient and daughter do note he has had some longstanding trouble with the left leg attributed to  childhood sports injury, but he has had worsening bilateral lower extremity symptoms in the past few weeks.  He denies any loss of sensation when being cleaned  Past Medical History:  Diagnosis Date   Acute urinary retention 09/04/2020   Chronic venous stasis 05/28/2017   Colon cancer ([place])    reported by patient and wife   Diarrhea 09/04/2020   Fracture    right  forearm, tibia/fibula   Gait instability 05/28/2017   Heart murmur, systolic 0/34/7425   Hernia, abdominal    History of diabetes mellitus 05/28/2017   Hypertension    Hypokalemia 08/29/2020   Hyponatremia 09/04/2020   Malnutrition of moderate degree 08/30/2020   Obesity (BMI 30-39.9) 05/28/2017   Pressure injury of skin 08/30/2020   Past Surgical History:  Procedure Laterality Date   COLONOSCOPY WITH PROPOFOL N/A 07/19/2020   Procedure: COLONOSCOPY WITH PROPOFOL;  Surgeon: Lesly Rubenstein, MD;  Location: [facility] ENDOSCOPY;  Service: Endoscopy;  Laterality: N/A;  C-19 07/18/2020 AM   COLONOSCOPY WITH PROPOFOL N/A 07/22/2020   Procedure: COLONOSCOPY WITH PROPOFOL;  Surgeon: Lesly Rubenstein, MD;  Location: [facility] ENDOSCOPY;  Service: Endoscopy;  Laterality: N/A;   EYE SURGERY     FRACTURE SURGERY     right forearm, tibia/fibula   HERNIA REPAIR     TONSILLECTOMY     VASECTOMY     Current Outpatient Medications  Medication Instructions   acetaminophen (TYLENOL) 650 mg, Oral, Every 4 hours PRN   cyclobenzaprine (FLEXERIL) 5 mg, Oral, 3 times daily PRN   loperamide (IMODIUM) 2 mg, Oral, Every 4 hours PRN   magnesium oxide (MAG-OX) 400 mg, Oral, Daily   Magnesium 250 mg, Oral, 2 times daily   Multiple Vitamin (MULTIVITAMIN WITH MINERALS) TABS tablet 1 tablet, Oral, Daily   nystatin (MYCOSTATIN/NYSTOP) powder Topical, 2 times daily   potassium chloride SA (KLOR-CON) 20 MEQ tablet 40 mEq, Oral, Daily   promethazine (PHENERGAN) 12.5 mg, Oral, Every 6 hours PRN   tamsulosin (FLOMAX) 0.4 mg, Oral, Daily   traZODone (DESYREL) 25 mg, Oral, At bedtime PRN    Current Facility-Administered Medications:    acetaminophen (TYLENOL) tablet 650 mg, 650 mg, Oral, Q6H PRN **OR** acetaminophen (TYLENOL) suppository 650 mg, 650 mg, Rectal, Q6H PRN, Athena Masse, MD   ceFEPIme (MAXIPIME) 2 g in sodium chloride 0.9 % 100 mL IVPB, 2 g, Intravenous, Q8H, Athena Masse, MD, Stopped at 09/22/20 0604    Chlorhexidine Gluconate Cloth 2 % PADS 6 each, 6 each, Topical, Daily, Judd Gaudier V, MD, 6 each at 09/21/20 1739   heparin ADULT infusion 100 units/mL (25000 units/274m), 1,950 Units/hr, Intravenous, Continuous, Amery, Sahar, MD, Last Rate: 16.5 mL/hr at 09/22/20 0640, 1,650 Units/hr at 09/22/20 0640   HYDROcodone-acetaminophen (NORCO/VICODIN) 5-325 MG per tablet 1-2 tablet, 1-2 tablet, Oral, Q4H PRN, DAthena Masse MD   MEDLINE mouth rinse, 15 mL, Mouth Rinse, BID, DAthena Masse MD   metroNIDAZOLE (FLAGYL) IVPB 500 mg, 500 mg, Intravenous, Q8H, DJudd GaudierV, MD, Last Rate: 100 mL/hr at 09/22/20 0619, 500 mg at 09/22/20 0619   ondansetron (ZOFRAN) tablet 4 mg, 4 mg, Oral, Q6H PRN **OR** ondansetron (ZOFRAN) injection 4 mg, 4 mg, Intravenous, Q6H PRN, DAthena Masse MD   Family History  Problem Relation Age of Onset   Heart attack Father    Diabetes Sister    Social History:  reports that he quit smoking about 37 years ago. He smoked an average of 0.25 packs per day. He has  never used smokeless tobacco. He reports that he does not drink alcohol and does not use drugs.  Exam: Current vital signs: BP (!) 123/59 (BP Location: Left Arm)   Pulse 82   Temp (!) 97.5 F (36.4 C) (Oral)   Resp 18   Ht 5' 7"  (1.702 m)   Wt 90.7 kg   SpO2 96%   BMI 31.32 kg/m  Vital signs in last 24 hours: Temp:  [97.5 F (36.4 C)-98.8 F (37.1 C)] 97.5 F (36.4 C) (06/12 1149) Pulse Rate:  [82-97] 82 (06/12 1149) Resp:  [16-20] 18 (06/12 1149) BP: (109-126)/(53-66) 123/59 (06/12 1149) SpO2:  [93 %-96 %] 96 % (06/12 1149)   Physical Exam  Constitutional: Appears pale and chronically ill Psych: Affect appropriate to situation, at times mildly anxious, sometimes perseverative/repetitive Eyes: No scleral injection, mild conjunctival discharge bilaterally HENT: No oropharyngeal obstruction. Reduced head turn to the right compared to the left, both passively and actively.  Able to touch his  chin to his chest without difficulty. MSK: no joint deformities.  No tenderness to palpation throughout his spinal column Cardiovascular: Normal rate and regular rhythm.  Respiratory: Effort normal, non-labored breathing GI: Soft.  No distension. There is no tenderness.  Skin: Warm dry and intact visible skin. Significant left lower extremity edema compared to the right  Neuro: Mental Status: Patient is awake, alert, oriented to person, place, month, year, and situation. Speech is slow at times with delayed latency of responses.  Additionally he seems to have some word finding difficulty in casual speech, for example struggling to describe a sensation of glare in his vision.  He is unable to name stethoscope but daughter reports is not what he would know at baseline.  Cranial Nerves: II: Visual Fields are full to finger counting, however acuity is difficult to test given he has difficulty keeping his eyes open.  He does struggle to resolve the hands of the clock, resolving the other hand which is at approximately 3 PM but failing to resolve the minute hand or the second hand. Pupils are equal, round, and reactive to light, 2 mm to 1.5 mm, very challenging to visualize given that he closes his eyes as soon as any light is shined.  While he denies pain with light application, he is clearly unable to tolerate a funduscopic examination given he barely tolerated a pupillary examination III,IV, VI: EOMI diploplia.  V: Facial sensation is symmetric to temperature VII: Facial movement is symmetric.  VIII: hearing is intact to voice X: Uvula elevates symmetrically XI: Shoulder shrug is symmetric, significant head turn above under HENT.   XII: tongue is midline without atrophy or fasciculations.  Motor: 5/5 strength throughout the upper extremities.   Lower extremities 2/5 bilateral hip flexion,  4+/5 right knee flexion/extension, 5/5 right foot dorsiflexion/plantarflexion 4/5 left knee  flexion/extension, 5/5 left foot dorsiflexion/plantarflexion Sensory: Sensation is symmetric to light touch and temperature in the arms and legs.  Poor cold temperature sensation throughout Deep Tendon Reflexes: 2+ and symmetric in the biceps, reports clinically absent patellar reflexes, but I am able to obtain an right patellar reflex.  He has some guarding on the left patella that makes it difficult to accurately assess Plantars: Left toe is upgoing, right is downgoing Cerebellar: Finger-nose is intact bilaterally, he is unable to perform heel-to-shin given hip flexor weakness  I have reviewed labs in epic and the results pertinent to this consultation are: Sodium on admission 122, calcium 7.6 with albumin of 2.1 corrects to  normal (9.1) CBC notable for leukocytosis to 17.1, hemoglobin 9.6 PT 17.7 INR 1.5 UA with large leukocytes, moderate hemoglobin, many bacteria, greater than 50 WBCs; culture with multiple species Urine sodium 25, urine osmolality 592  Lab Results  Component Value Date   VITAMINB12 303 08/29/2020      I have reviewed the images obtained:  CT abdomen pelvis 09/20/2020 personally reviewed, agree with radiology "Large complex sigmoid colonic mass again noted compatible with colon cancer. No evidence of bowel obstruction. Mild bladder wall thickening with apparent mucosal enhancement concerning for possible cystitis. Recommend clinical correlation. Foley catheter in place. Large anterior bladder wall diverticulum. Mild bilateral hydronephrosis, right greater than left, slightly improved since prior study. [Addendum... The abnormal bladder wall thickening is most pronounced superiorly where the bladder wall is inseparable from the large sigmoid colonic mass. Given the history, colovesical fistula is likely.] Prostate enlargement. Bilateral pleural effusions, left greater than right. Compressive atelectasis in the left lower lobe. Diffuse coronary artery disease.   Aortic atherosclerosis."  Lower extremity duplex 09/21/2020: Positive for DVT in the left lower extremity with nonocclusive thrombus in the common femoral vein, saphenofemoral junction, profundus femoris, proximal femoral vein, and calf veins. Thrombus in the proximal femoral vein appears nearly occlusive, though mid and distal femoral vein are patent.   No evidence of DVT in the right lower extremity.  Impression: Regarding the patient's syncopal event, given there was no postictal state despite 2 minutes of tremulous activity, seizure is very unlikely.  It is difficult to assess the etiology of his vision loss given his poor tolerance of eye examination; in particular I am unable to perform a funduscopic examination at bedside with the patient barely tolerating pupillary evaluation.  I am concerned about his bilateral lower extremity weakness with urinary retention and fecal incontinence.  Certainly this could all be attributed to his abdominal mass, general medical condition and debility. Nevertheless, upgoing toe on his left leg is concerning for an upper motor neuron localization. And given report of worsening mobility while in rehab even prior to his acute sepsis presentation, pan neuraxis imaging to rule out CNS metastatic disease is reasonable.    Recommendations: -MRI brain, cervical spine, thoracic spine with and without contrast -CK -B12 repeat level -B12 1000 mcg daily PO given prior low normal level, goal > 400 -Neurology will continue to follow  [place] [phone] [facility] coverage for [facility] is from 8 AM to 4 AM in-house and 4 PM to 8 PM by telephone/video. 8 PM to 8 AM emergent questions or overnight urgent questions should be addressed to Teleneurology On-call or [facility] neurohospitalist; contact information can be found on AMION

## 2020-09-22 NOTE — Progress Notes (Addendum)
No C PROGRESS NOTE    Keith Reynolds  XKG:[id number] DOB: [birth year] DOA: 09/20/2020 PCP: Sofie Hartigan, MD    Brief Narrative:  Keith Reynolds is a 80 y.o. male with medical history significant for DM, HTN, recently diagnosed colorectal cancer, with known colovesical fistula with indwelling Foley, recently hospitalized from 5/19-5/25 for persistent diarrhea and severe hyponatremia, improved and discharged to SNF who was in his usual state of health until the morning of admission when he had an outpatient doctor's appointment however by the afternoon he was noted to be lethargic at the facility, febrile at 101.2 with O2 sats 80% on room air.  Requiring NRB for transport with EMS.  Patient has been having continued watery diarrhea and poor appetite but no vomiting.  He has suprapubic abdominal pain with mild to moderate intensity, nonradiating.  Has some cough and shortness of breath but no chest pain.  He has generalized weakness. ED course: On arrival, tachycardic at 113, tachypneic at 22 with O2 sat of 95 on nonrebreather.  Afebrile with BP 117/60  CTA chest with bilateral pulmonary emboli without evidence of right heart strain, moderate left pleural effusion among other findings CT abdomen and pelvis with large complex sigmoid colonic mass compatible with colon cancer without evidence of bowel obstruction, colovesical fistula, mild bilateral hydronephrosis, improved from prior study among other findings  6/11- c/o sob. No cp.  6/12-daughter at bedside.  Today she reports patient had seizure-like activity 2 days prior to coming to the hospital which was witnessed by PT at his SNF.  No seizure activity has been noted since admission.  She reports he seems to be doing better. Pt somewhat sleepy during my exam. He thinks he is feeling better.    Consultants:    Procedures:   Antimicrobials:  Cefepime,  metronidazole, vancomycin   Subjective: Denies cp, abd pain.    Objective: Vitals:   09/21/20 1602 09/21/20 1927 09/22/20 0411 09/22/20 0731  BP: (!) 118/59 (!) 109/53 126/66 119/65  Pulse: 97 88 92 88  Resp: 16 17 19 17   Temp: 98.5 F (36.9 C) 98 F (36.7 C) 97.9 F (36.6 C) 97.7 F (36.5 C)  TempSrc: Oral Oral  Oral  SpO2: 93% 93% 94% 95%  Weight:      Height:        Intake/Output Summary (Last 24 hours) at 09/22/2020 0809 Last data filed at 09/22/2020 0640 Gross per 24 hour  Intake 1670.51 ml  Output 1100 ml  Net 570.51 ml   Filed Weights   09/20/20 1705  Weight: 90.7 kg    Examination: Sleepy, calm Decrease bs , no wheezing Regular s1/s2, no gallop Soft benign +bs +edema L>>R Mood and affect appropriate in current setting   Data Reviewed: I have personally reviewed following labs and imaging studies  CBC: Recent Labs  Lab 09/20/20 1710 09/21/20 0500 09/22/20 0203  WBC 17.1* 16.9* 11.5*  NEUTROABS 14.2*  --   --   HGB 9.6* 9.2* 8.4*  HCT 28.7* 28.1* 25.8*  MCV 82.9 82.9 83.5  PLT 210 209 702   Basic Metabolic Panel: Recent Labs  Lab 09/20/20 1710 09/21/20 0500 09/22/20 0203  NA 122* 124*  125* 124*  K 3.9 3.4* 3.7  CL 88* 89* 91*  CO2 27 28 30   GLUCOSE 172* 145* 180*  BUN 29* 22 25*  CREATININE 0.47* 0.46* 0.47*  CALCIUM 7.6* 7.7* 7.4*   GFR: Estimated Creatinine Clearance: 79.1 mL/min (A) (by C-G formula based on  SCr of 0.47 mg/dL (L)). Liver Function Tests: Recent Labs  Lab 09/20/20 1710  AST 15  ALT 9  ALKPHOS 96  BILITOT 1.4*  PROT 5.0*  ALBUMIN 2.1*   No results for input(s): LIPASE, AMYLASE in the last 168 hours. No results for input(s): AMMONIA in the last 168 hours. Coagulation Profile: Recent Labs  Lab 09/20/20 1710 09/21/20 0500  INR 1.5* 1.4*   Cardiac Enzymes: No results for input(s): CKTOTAL, CKMB, CKMBINDEX, TROPONINI in the last 168 hours. BNP (last 3 results) No results for input(s): PROBNP in the last 8760 hours. HbA1C: No results for input(s): HGBA1C in the last  72 hours. CBG: No results for input(s): GLUCAP in the last 168 hours. Lipid Profile: No results for input(s): CHOL, HDL, LDLCALC, TRIG, CHOLHDL, LDLDIRECT in the last 72 hours. Thyroid Function Tests: No results for input(s): TSH, T4TOTAL, FREET4, T3FREE, THYROIDAB in the last 72 hours. Anemia Panel: No results for input(s): VITAMINB12, FOLATE, FERRITIN, TIBC, IRON, RETICCTPCT in the last 72 hours. Sepsis Labs: Recent Labs  Lab 09/20/20 1711 09/20/20 1910 09/21/20 0500  PROCALCITON  --   --  0.35  LATICACIDVEN 1.0 1.0  --     Recent Results (from the past 240 hour(s))  Blood Culture (routine x 2)     Status: None (Preliminary result)   Collection Time: 09/20/20  5:11 PM   Specimen: BLOOD  Result Value Ref Range Status   Specimen Description BLOOD BLOOD RIGHT FOREARM  Final   Special Requests   Final    BOTTLES DRAWN AEROBIC AND ANAEROBIC Blood Culture results may not be optimal due to an inadequate volume of blood received in culture bottles   Culture   Final    NO GROWTH < 24 HOURS Performed at [facility], [address] [place], [place] [address]    Report Status PENDING  Incomplete  Blood Culture (routine x 2)     Status: None (Preliminary result)   Collection Time: 09/20/20  5:11 PM   Specimen: BLOOD  Result Value Ref Range Status   Specimen Description BLOOD RIGHT ANTECUBITAL  Final   Special Requests   Final    BOTTLES DRAWN AEROBIC AND ANAEROBIC Blood Culture results may not be optimal due to an inadequate volume of blood received in culture bottles   Culture   Final    NO GROWTH < 24 HOURS Performed at [facility], [address], [place], [place] [address]    Report Status PENDING  Incomplete  Resp Panel by RT-PCR (Flu A&B, Covid) Nasopharyngeal Swab     Status: None   Collection Time: 09/20/20  5:18 PM   Specimen: Nasopharyngeal Swab; Nasopharyngeal(NP) swabs in vial transport medium  Result Value Ref Range Status   SARS Coronavirus 2  by RT PCR NEGATIVE NEGATIVE Final    Comment: (NOTE) SARS-CoV-2 target nucleic acids are NOT DETECTED.  The SARS-CoV-2 RNA is generally detectable in upper respiratory specimens during the acute phase of infection. The lowest concentration of SARS-CoV-2 viral copies this assay can detect is 138 copies/mL. A negative result does not preclude SARS-Cov-2 infection and should not be used as the sole basis for treatment or other patient management decisions. A negative result may occur with  improper specimen collection/handling, submission of specimen other than nasopharyngeal swab, presence of viral mutation(s) within the areas targeted by this assay, and inadequate number of viral copies(<138 copies/mL). A negative result must be combined with clinical observations, patient history, and epidemiological information. The expected  result is Negative.  Fact Sheet for Patients:  EntrepreneurPulse.com.au  Fact Sheet for Healthcare Providers:  IncredibleEmployment.be  This test is no t yet approved or cleared by the [place] FDA and  has been authorized for detection and/or diagnosis of SARS-CoV-2 by FDA under an Emergency Use Authorization (EUA). This EUA will remain  in effect (meaning this test can be used) for the duration of the COVID-19 declaration under Section 564(b)(1) of the Act, 21 U.S.C.section 360bbb-3(b)(1), unless the authorization is terminated  or revoked sooner.       Influenza A by PCR NEGATIVE NEGATIVE Final   Influenza B by PCR NEGATIVE NEGATIVE Final    Comment: (NOTE) The Xpert Xpress SARS-CoV-2/FLU/RSV plus assay is intended as an aid in the diagnosis of influenza from Nasopharyngeal swab specimens and should not be used as a sole basis for treatment. Nasal washings and aspirates are unacceptable for Xpert Xpress SARS-CoV-2/FLU/RSV testing.  Fact Sheet for Patients: EntrepreneurPulse.com.au  Fact  Sheet for Healthcare Providers: IncredibleEmployment.be  This test is not yet approved or cleared by the [place] FDA and has been authorized for detection and/or diagnosis of SARS-CoV-2 by FDA under an Emergency Use Authorization (EUA). This EUA will remain in effect (meaning this test can be used) for the duration of the COVID-19 declaration under Section 564(b)(1) of the Act, 21 U.S.C. section 360bbb-3(b)(1), unless the authorization is terminated or revoked.  Performed at [facility], [address] [place], [place] [address]          Radiology Studies: CT Angio Chest PE W/Cm &/Or Wo Cm  Result Date: 09/20/2020 CLINICAL DATA:  Lethargic.  Low O2 sats. EXAM: CT ANGIOGRAPHY CHEST WITH CONTRAST TECHNIQUE: Multidetector CT imaging of the chest was performed using the standard protocol during bolus administration of intravenous contrast. Multiplanar CT image reconstructions and MIPs were obtained to evaluate the vascular anatomy. CONTRAST:  142mL OMNIPAQUE IOHEXOL 350 MG/ML SOLN COMPARISON:  None. FINDINGS: Cardiovascular: Pulmonary emboli are seen in the right upper and lower lobe pulmonary arterial branches and also in the left upper lobe. No evidence of right heart strain. Diffuse coronary artery calcifications and moderate aortic calcifications. Tortuous aorta. No aneurysm. Mediastinum/Nodes: No mediastinal, hilar, or axillary adenopathy. Trachea and esophagus are unremarkable. Thyroid unremarkable. Lungs/Pleura: Moderate left pleural effusion and small right pleural effusion. Compressive atelectasis in the left lower lobe. Minimal right base atelectasis. Upper Abdomen: Imaging into the upper abdomen demonstrates no acute findings. Musculoskeletal: Abnormal soft tissue noted in the left lateral chest wall measuring up to 12.5 cm in AP dimension on image 80 of series 4. This may reflect large left lateral chest wall hematoma. This conceivably also could  reflect asymmetric edema/anasarca as similar finding was seen on prior abdominal CT from 09/01/2020.No acute bony abnormality. Review of the MIP images confirms the above findings. IMPRESSION: Pulmonary emboli bilaterally, most notable in the right lung particularly right lower lobe. No evidence of right heart strain. Moderate left pleural effusion and small right pleural effusion. Compressive atelectasis in the left lower lobe. Diffuse coronary artery calcifications. Aortic Atherosclerosis (ICD10-I70.0). Critical Value/emergent results were called by telephone at the time of interpretation on 09/20/2020 at 7:36 pm to provider [facility] , who verbally acknowledged these results. Electronically Signed   By: Rolm Baptise M.D.   On: 09/20/2020 19:40   CT Abdomen Pelvis W Contrast  Addendum Date: 09/20/2020   ADDENDUM REPORT: 09/20/2020 19:42 ADDENDUM: After discussing the case with Dr. Cheri Fowler, there is clinical concern for  possible colovesical fistula. The abnormal bladder wall thickening is most pronounced superiorly where the bladder wall is inseparable from the large sigmoid colonic mass. Given the history, colovesical fistula is likely. Electronically Signed   By: Rolm Baptise M.D.   On: 09/20/2020 19:42   Result Date: 09/20/2020 CLINICAL DATA:  Lethargic. Fever. Abdominal abscess/infection expected EXAM: CT ABDOMEN AND PELVIS WITH CONTRAST TECHNIQUE: Multidetector CT imaging of the abdomen and pelvis was performed using the standard protocol following bolus administration of intravenous contrast. CONTRAST:  13mL OMNIPAQUE IOHEXOL 350 MG/ML SOLN COMPARISON:  09/01/2020 FINDINGS: Lower chest: Bilateral pleural effusions, left larger than right. Compressive atelectasis in the lung bases. Heart is borderline in size. Diffuse coronary artery calcifications and distal thoracic aortic calcifications. Hepatobiliary: No focal hepatic abnormality. Gallbladder unremarkable. Pancreas: No focal abnormality or ductal  dilatation. Spleen: No focal abnormality.  Normal size. Adrenals/Urinary Tract: Foley catheter within the bladder. Bladder wall is irregular and thickened. There appears to be mucosal enhancement. Appearance is concerning for possible cystitis. Large anterior bladder wall diverticulum noted. Mild bilateral hydronephrosis, right greater than left, slightly improved since prior study. Stomach/Bowel: Complex irregular mass again seen in the proximal to mid sigmoid colon, unchanged since recent study compatible with. No bowel obstruction. Stomach and small bowel decompressed, grossly unremarkable. Colonic malignancy Vascular/Lymphatic: Aortic atherosclerosis. No evidence of aneurysm or adenopathy. Reproductive: Prostate enlargement Other: No free fluid or free air. Musculoskeletal: No acute bony abnormality. Mild compression fractures through the inferior L3 vertebral body is similar to prior study. IMPRESSION: Large complex sigmoid colonic mass again noted compatible with colon cancer. No evidence of bowel obstruction. Mild bladder wall thickening with apparent mucosal enhancement concerning for possible cystitis. Recommend clinical correlation. Foley catheter in place. Large anterior bladder wall diverticulum. Mild bilateral hydronephrosis, right greater than left, slightly improved since prior study. Prostate enlargement. Bilateral pleural effusions, left greater than right. Compressive atelectasis in the left lower lobe. Diffuse coronary artery disease.  Aortic atherosclerosis. Electronically Signed: By: Rolm Baptise M.D. On: 09/20/2020 19:32   US Venous Img Lower Bilateral (DVT)  Result Date: 09/21/2020 CLINICAL DATA:  Swelling, bilateral pulmonary emboli EXAM: BILATERAL LOWER EXTREMITY VENOUS DOPPLER ULTRASOUND TECHNIQUE: Gray-scale sonography with graded compression, as well as color Doppler and duplex ultrasound were performed to evaluate the lower extremity deep venous systems from the level of the common  femoral vein and including the common femoral, femoral, profunda femoral, popliteal and calf veins including the posterior tibial, peroneal and gastrocnemius veins when visible. The superficial great saphenous vein was also interrogated. Spectral Doppler was utilized to evaluate flow at rest and with distal augmentation maneuvers in the common femoral, femoral and popliteal veins. COMPARISON:  Ultrasound 08/29/2020 FINDINGS: RIGHT LOWER EXTREMITY Common Femoral Vein: No evidence of thrombus. Normal compressibility, respiratory phasicity and response to augmentation. Saphenofemoral Junction: No evidence of thrombus. Normal compressibility and flow on color Doppler imaging. Profunda Femoral Vein: No evidence of thrombus. Normal compressibility and flow on color Doppler imaging. Femoral Vein: No evidence of thrombus. Normal compressibility, respiratory phasicity and response to augmentation. Popliteal Vein: No evidence of thrombus. Normal compressibility, respiratory phasicity and response to augmentation. Calf Veins: No evidence of thrombus. Normal compressibility and flow on color Doppler imaging. Superficial Great Saphenous Vein: No evidence of thrombus. Normal compressibility. Venous Reflux:  None. Other Findings:  Lower extremity edema. LEFT LOWER EXTREMITY Common Femoral Vein: Nonocclusive thrombus is present. Normal respiratory phasicity. Saphenofemoral Junction: Nonocclusive thrombus is present. Profunda Femoral Vein: Nonocclusive thrombus is present. Femoral Vein:  Nearly occlusive thrombus in the proximal femoral vein. The mid and distal femoral vein is patent. Popliteal Vein: No evidence of thrombus. Calf Veins: Nonocclusive thrombus is present in the anterior and posterior tibial veins and the peroneal vein without compressibility. Superficial Great Saphenous Vein: Patent. Venous Reflux:  Not applicable. Other Findings:  Lower extremity edema. IMPRESSION: Positive for DVT in the left lower extremity with  nonocclusive thrombus in the common femoral vein, saphenofemoral junction, profundus femoris, proximal femoral vein, and calf veins. Thrombus in the proximal femoral vein appears nearly occlusive, though mid and distal femoral vein are patent. No evidence of DVT in the right lower extremity. Electronically Signed   By: Maurine Simmering   On: 09/21/2020 11:40   DG Chest Port 1 View  Result Date: 09/20/2020 CLINICAL DATA:  Lethargy and possible sepsis, initial encounter EXAM: PORTABLE CHEST 1 VIEW COMPARISON:  None. FINDINGS: Cardiac shadow is enlarged. Aortic calcifications are noted. Left-sided pleural effusion and underlying atelectasis/infiltrate is seen. Right lung is clear. Mild central vascular congestion is noted. No bony abnormality is noted. IMPRESSION: Left-sided effusion with underlying atelectasis/infiltrate. Mild vascular congestion. Electronically Signed   By: Inez Catalina M.D.   On: 09/20/2020 17:42        Scheduled Meds:  Chlorhexidine Gluconate Cloth  6 each Topical Daily   heparin  2,500 Units Intravenous Once   mouth rinse  15 mL Mouth Rinse BID   Continuous Infusions:  ceFEPime (MAXIPIME) IV Stopped (09/22/20 0604)   heparin 1,650 Units/hr (09/22/20 0640)   metronidazole 500 mg (09/22/20 0938)    Assessment & Plan:   Principal Problem:   Bilateral pulmonary embolism ([place]) Active Problems:   Diabetes mellitus, type II ([place])   Hypertension   Hyponatremia   Diarrhea   Colorectal cancer ([place])   Colovesical fistula   Indwelling Foley catheter present   Acute respiratory failure with hypoxia ([place])   Sepsis ([place])   80 year old male with history of DM, HTN, recently diagnosed colorectal cancer, with known colovesical fistula with indwelling Foley, recently hospitalized from 5/19-5/25 for persistent diarrhea and severe hyponatremia, improved and discharged to SNF sent in for evaluation of lethargy, fever and sats 80% on room air requiring NRB for transportation.        Bilateral pulmonary embolism (H[facility])   Acute respiratory failure with hypoxia ([place]) -Patient presents with shortness of breath, tachypnea with O2 sat 80% requiring NRB to maintain sats in the mid 90s -CTA chest with bilateral pulmonary emboli without evidence of right heart strain, moderate left pleural effusion among other findings 6/12- continue on iv heparin Patient declined thrombectomy on admission Continue O2 supplementation to keep O2 sats above 92% Once oxygenation improves will switch to Eliquis Can f/u with hematology as outpt May consider thoracentesis in a.m. by IR, will discuss with pt  Hyponatremia - Suspect hypovolemic in the setting of ongoing diarrhea, likely related to colon cancer 6/12- will dc ivf as Na 124.will keep fluid restricted 1L and see how he does. Monitor sodium level     Sepsis (HCC)   Indwelling Foley catheter present - Patient with fever, tachycardia, hypoxia, lethargy, leukocytosis though with normal lactic acid 6/12 UA positive  Urine culture with multiple species  Wbc improving Procalcitonin improving No definet infiltrate on cxr Will continue empirical abx tx with iv cefepime and flagyl F/u bcx  Seizure- reported by daughter today , witnessed by PT at SNF prior to admission.  Could be due to PE/hypoxia/hyponatremia. No hx/o seizure per daughter May  need EEG Will consult neurology, spoke to Dr. Curly [place]     Diabetes mellitus, type II ([place]) BG stable RISS     Hypertension Normotensive, hold bp meds     Diarrhea   Colorectal cancer ([place])   Colovesical fistula Foley care Skin care Ck c.diff   Generalized weakness/physical deconditioning - Nutritionist evaluation and PT eval     DVT prophylaxis: IV heparin Code Status: DNR Family Communication: Daughter at bedside Disposition Plan:  Status is: Inpatient  Remains inpatient appropriate because:Inpatient level of care appropriate due to severity of illness  Dispo: The patient is  from: Home              Anticipated d/c is to: Home              Patient currently is not medically stable to d/c.   Difficult to place patient No   Pt still requiring 4L 02, still receiving iv meds, needs PT/OT          LOS: 2 days   Time spent: 45 minutes with more than 50% on [place], MD [facility] Pager [phone]  If 7PM-7AM, please contact night-coverage 09/22/2020, 8:09 AM

## 2020-09-22 NOTE — Progress Notes (Addendum)
Pt scrotum with increased swelling. Flushed foley catheter with normal saline and a large volume (582ml) of sedimented urine was released. Will continue to monitor.

## 2020-09-22 NOTE — Progress Notes (Signed)
Pt complaining of increased pain in scrotal area. Dr Kurtis Bushman made aware. Dr Kurtis Bushman requested that pts scrotom be elevated. Elevated pts scrotom on a towel.Will continue to monitor.

## 2020-09-22 NOTE — Consult Note (Signed)
ANTICOAGULATION CONSULT NOTE - Initial Consult  Pharmacy Consult for heparin infusion Indication: pulmonary embolus  Allergies  Allergen Reactions   Antihistamines, Diphenhydramine-Type    Bactrim [Sulfamethoxazole-Trimethoprim] Nausea Only   Chamomile Other (See Comments)   Diphenhydramine Hcl Other (See Comments)    dizziness   Gabapentin    Levomenol    Pseudoephedrine Hcl Other (See Comments)    dizziness   Rosuvastatin    Triprolidine Hcl Other (See Comments)    dizziness    Patient Measurements: Height: 5\' 7"  (170.2 cm) Weight: 90.7 kg (199 lb 15.3 oz) IBW/kg (Calculated) : 66.1 Heparin Dosing Weight: 85 kg   Vital Signs: Temp: 97.9 F (36.6 C) (06/12 0411) Temp Source: Oral (06/11 1927) BP: 126/66 (06/12 0411) Pulse Rate: 92 (06/12 0411)  Labs: Recent Labs    09/20/20 1710 09/20/20 1710 09/20/20 1910 09/21/20 0500 09/21/20 1613 09/22/20 0203 09/22/20 0613  HGB 9.6*  --   --  9.2*  --  8.4*  --   HCT 28.7*  --   --  28.1*  --  25.8*  --   PLT 210  --   --  209  --  202  --   APTT 39*  --   --   --   --   --   --   LABPROT 17.7*  --   --  16.7*  --   --   --   INR 1.5*  --   --  1.4*  --   --   --   HEPARINUNFRC  --    < >  --  <0.10* 0.69 <0.10* <0.10*  CREATININE 0.47*  --   --  0.46*  --  0.47*  --   TROPONINIHS 9  --  7  --   --   --   --    < > = values in this interval not displayed.     Estimated Creatinine Clearance: 79.1 mL/min (A) (by C-G formula based on SCr of 0.47 mg/dL (L)).   Medical History: Past Medical History:  Diagnosis Date   Acute urinary retention 09/04/2020   Chronic venous stasis 05/28/2017   Colon cancer ([place])    reported by patient and wife   Diarrhea 09/04/2020   Fracture    right forearm, tibia/fibula   Gait instability 05/28/2017   Heart murmur, systolic 2/69/4854   Hernia, abdominal    History of diabetes mellitus 05/28/2017   Hypertension    Hypokalemia 08/29/2020   Hyponatremia 09/04/2020   Malnutrition of  moderate degree 08/30/2020   Obesity (BMI 30-39.9) 05/28/2017   Pressure injury of skin 08/30/2020    Medications:  No prior anticoagulation noted  Assessment: 80 y.o. male presents from a long-term care facility via EMS complaining of of shortness. Found to have bilateral pulmonary emboli on CT. Pharmacy has been consulted for initiation and management of heparin infusion for PE.  Baseline aPTT 36, INR 1.5   6/11 0500 HL <0.10, rate increased to 1650 units/hr 6/11 1613 HL 0.69, therapeutic x 1 6/12 0203 HL  < 0.1, subtherapeutic 6/12 0613 HL  < 0.1, subtherapeutic   Goal of Therapy:  Heparin level 0.3-0.7 units/ml Monitor platelets by anticoagulation protocol: Yes   Plan:  6/12:  6/12 @ 0203:  HL = < 0.1 Lab had difficulty with this sample - results were posted late and original result was < 0.01 so lab tech respun sample.  Previous level (1613) was 0.69 but has fallen to , 0.1  despite no interruption in infusion.   Will repeat HL @ 0530 to ensure accuracy.   6/12:  HL @ 0613 = < 0.1 , appears to valid Will order Heparin 2500 units IV X 1 and increase drip rate to 1950 units/hr.  Will order HL 8 hrs after rate change.   Rakayla Ricklefs D 09/22/2020 7:08 AM

## 2020-09-22 NOTE — Consult Note (Signed)
ANTICOAGULATION CONSULT NOTE   Pharmacy Consult for heparin infusion Indication: pulmonary embolus  Allergies  Allergen Reactions   Antihistamines, Diphenhydramine-Type    Bactrim [Sulfamethoxazole-Trimethoprim] Nausea Only   Chamomile Other (See Comments)   Diphenhydramine Hcl Other (See Comments)    dizziness   Gabapentin    Levomenol    Pseudoephedrine Hcl Other (See Comments)    dizziness   Rosuvastatin    Triprolidine Hcl Other (See Comments)    dizziness    Patient Measurements: Height: 5\' 7"  (170.2 cm) Weight: 90.7 kg (199 lb 15.3 oz) IBW/kg (Calculated) : 66.1 Heparin Dosing Weight: 85 kg   Vital Signs: Temp: 98.4 F (36.9 C) (06/12 1522) Temp Source: Oral (06/12 1522) BP: 129/65 (06/12 1522) Pulse Rate: 88 (06/12 1522)  Labs: Recent Labs    09/20/20 1710 09/20/20 1910 09/21/20 0500 09/21/20 1613 09/22/20 0203 09/22/20 0613 09/22/20 1620  HGB 9.6*  --  9.2*  --  8.4*  --   --   HCT 28.7*  --  28.1*  --  25.8*  --   --   PLT 210  --  209  --  202  --   --   APTT 39*  --   --   --   --   --   --   LABPROT 17.7*  --  16.7*  --   --   --   --   INR 1.5*  --  1.4*  --   --   --   --   HEPARINUNFRC  --   --  <0.10*   < > <0.10* <0.10* 0.67  CREATININE 0.47*  --  0.46*  --  0.47*  --   --   CKTOTAL  --   --   --   --   --   --  9*  TROPONINIHS 9 7  --   --   --   --   --    < > = values in this interval not displayed.    Estimated Creatinine Clearance: 79.1 mL/min (A) (by C-G formula based on SCr of 0.47 mg/dL (L)).   Medical History: Past Medical History:  Diagnosis Date   Acute urinary retention 09/04/2020   Chronic venous stasis 05/28/2017   Colon cancer ([place])    reported by patient and wife   Diarrhea 09/04/2020   Fracture    right forearm, tibia/fibula   Gait instability 05/28/2017   Heart murmur, systolic 07/20/8117   Hernia, abdominal    History of diabetes mellitus 05/28/2017   Hypertension    Hypokalemia 08/29/2020   Hyponatremia  09/04/2020   Malnutrition of moderate degree 08/30/2020   Obesity (BMI 30-39.9) 05/28/2017   Pressure injury of skin 08/30/2020    Medications:  No prior anticoagulation noted  Assessment: 80 y.o. male presents from a long-term care facility via EMS complaining of of shortness. Found to have bilateral pulmonary emboli on CT. Pharmacy has been consulted for initiation and management of heparin infusion for PE.  Baseline aPTT 36, INR 1.5   6/11 0500 HL <0.10, rate increased to 1650 units/hr 6/11 1613 HL 0.69, therapeutic x 1 6/12 0203 HL  < 0.1, subtherapeutic 6/12 0613 HL  < 0.1, subtherapeutic, rate increased to 1950 units/hr 6/12 1620 HL 0.67 therapeutic x1  This afternoon, heparin level is in therapeutic range at 0.67 after infusion rate increase this morning. Hgb and platelets trended down this morning. No issues with the infusion or overt bleeding noted per RN.  Goal of Therapy:  Heparin level 0.3-0.7 units/ml Monitor platelets by anticoagulation protocol: Yes   Plan:  Continue heparin infusion at 1950 units/hr Check heparin level in 8 hours Monitor CBC, daily heparin level  Continue to monitor for signs/symptoms of bleeding F/u transition to oral anticoagulant   Brendolyn Patty, PharmD Clinical Pharmacist  09/22/2020   5:33 PM

## 2020-09-22 NOTE — Consult Note (Signed)
ANTICOAGULATION CONSULT NOTE - Initial Consult  Pharmacy Consult for heparin infusion Indication: pulmonary embolus  Allergies  Allergen Reactions   Antihistamines, Diphenhydramine-Type    Bactrim [Sulfamethoxazole-Trimethoprim] Nausea Only   Chamomile Other (See Comments)   Diphenhydramine Hcl Other (See Comments)    dizziness   Gabapentin    Levomenol    Pseudoephedrine Hcl Other (See Comments)    dizziness   Rosuvastatin    Triprolidine Hcl Other (See Comments)    dizziness    Patient Measurements: Height: 5\' 7"  (170.2 cm) Weight: 90.7 kg (199 lb 15.3 oz) IBW/kg (Calculated) : 66.1 Heparin Dosing Weight: 85 kg   Vital Signs: Temp: 97.9 F (36.6 C) (06/12 0411) Temp Source: Oral (06/11 1927) BP: 126/66 (06/12 0411) Pulse Rate: 92 (06/12 0411)  Labs: Recent Labs    09/20/20 1710 09/20/20 1910 09/21/20 0500 09/21/20 1613 09/22/20 0203  HGB 9.6*  --  9.2*  --  8.4*  HCT 28.7*  --  28.1*  --  25.8*  PLT 210  --  209  --  202  APTT 39*  --   --   --   --   LABPROT 17.7*  --  16.7*  --   --   INR 1.5*  --  1.4*  --   --   HEPARINUNFRC  --   --  <0.10* 0.69 <0.10*  CREATININE 0.47*  --  0.46*  --  0.47*  TROPONINIHS 9 7  --   --   --      Estimated Creatinine Clearance: 79.1 mL/min (A) (by C-G formula based on SCr of 0.47 mg/dL (L)).   Medical History: Past Medical History:  Diagnosis Date   Acute urinary retention 09/04/2020   Chronic venous stasis 05/28/2017   Colon cancer ([place])    reported by patient and wife   Diarrhea 09/04/2020   Fracture    right forearm, tibia/fibula   Gait instability 05/28/2017   Heart murmur, systolic 9/73/5329   Hernia, abdominal    History of diabetes mellitus 05/28/2017   Hypertension    Hypokalemia 08/29/2020   Hyponatremia 09/04/2020   Malnutrition of moderate degree 08/30/2020   Obesity (BMI 30-39.9) 05/28/2017   Pressure injury of skin 08/30/2020    Medications:  No prior anticoagulation noted  Assessment: 80 y.o.  male presents from a long-term care facility via EMS complaining of of shortness. Found to have bilateral pulmonary emboli on CT. Pharmacy has been consulted for initiation and management of heparin infusion for PE.  Baseline aPTT 36, INR 1.5   6/11 0500 HL <0.10, rate increased to 1650 units/hr 6/11 1613 HL 0.69, therapeutic x 1 6/12 0203 HL  < 0.1, subtherapeutic  Goal of Therapy:  Heparin level 0.3-0.7 units/ml Monitor platelets by anticoagulation protocol: Yes   Plan:  6/12:  6/12 @ 0203:  HL = < 0.1 Lab had difficulty with this sample - results were posted late and original result was < 0.01 so lab tech respun sample.  Previous level (1613) was 0.69 but has fallen to , 0.1 despite no interruption in infusion.   Will repeat HL @ 0530 to ensure accuracy.   Jiovanni Heeter D 09/22/2020 5:24 AM

## 2020-09-23 ENCOUNTER — Encounter: Payer: Self-pay | Admitting: Internal Medicine

## 2020-09-23 DIAGNOSIS — I6389 Other cerebral infarction: Secondary | ICD-10-CM

## 2020-09-23 DIAGNOSIS — Z7189 Other specified counseling: Secondary | ICD-10-CM

## 2020-09-23 DIAGNOSIS — C19 Malignant neoplasm of rectosigmoid junction: Secondary | ICD-10-CM

## 2020-09-23 LAB — CBC
HCT: 27.4 % — ABNORMAL LOW (ref 39.0–52.0)
Hemoglobin: 9 g/dL — ABNORMAL LOW (ref 13.0–17.0)
MCH: 27.6 pg (ref 26.0–34.0)
MCHC: 32.8 g/dL (ref 30.0–36.0)
MCV: 84 fL (ref 80.0–100.0)
Platelets: 207 K/uL (ref 150–400)
RBC: 3.26 MIL/uL — ABNORMAL LOW (ref 4.22–5.81)
RDW: 16.3 % — ABNORMAL HIGH (ref 11.5–15.5)
WBC: 7.7 K/uL (ref 4.0–10.5)
nRBC: 0 % (ref 0.0–0.2)

## 2020-09-23 LAB — BASIC METABOLIC PANEL
Anion gap: 3 — ABNORMAL LOW (ref 5–15)
BUN: 24 mg/dL — ABNORMAL HIGH (ref 8–23)
CO2: 30 mmol/L (ref 22–32)
Calcium: 7.6 mg/dL — ABNORMAL LOW (ref 8.9–10.3)
Chloride: 90 mmol/L — ABNORMAL LOW (ref 98–111)
Creatinine, Ser: 0.43 mg/dL — ABNORMAL LOW (ref 0.61–1.24)
GFR, Estimated: >60 mL/min (ref >60)
Glucose, Bld: 170 mg/dL — ABNORMAL HIGH (ref 70–99)
Potassium: 3.9 mmol/L (ref 3.5–5.1)
Sodium: 123 mmol/L — ABNORMAL LOW (ref 135–145)

## 2020-09-23 LAB — HEPARIN LEVEL (UNFRACTIONATED)
Heparin Unfractionated: <0.1 IU/mL — ABNORMAL LOW (ref 0.30–0.70)
Heparin Unfractionated: 0.13 [IU]/mL — ABNORMAL LOW (ref 0.30–0.70)
Heparin Unfractionated: 0.25 IU/mL — ABNORMAL LOW (ref 0.30–0.70)

## 2020-09-23 LAB — BRAIN NATRIURETIC PEPTIDE: B Natriuretic Peptide: 257.4 pg/mL — ABNORMAL HIGH (ref 0.0–100.0)

## 2020-09-23 LAB — VITAMIN B12: Vitamin B-12: 310 pg/mL (ref 180–914)

## 2020-09-23 MED ORDER — HEPARIN BOLUS VIA INFUSION
2500.0000 [IU] | Freq: Once | INTRAVENOUS | Status: AC
  Administered 2020-09-23: 2500 [IU] via INTRAVENOUS
  Filled 2020-09-23: qty 2500

## 2020-09-23 MED ORDER — FUROSEMIDE 10 MG/ML IJ SOLN
20.0000 mg | Freq: Once | INTRAMUSCULAR | Status: AC
  Administered 2020-09-23: 20 mg via INTRAVENOUS
  Filled 2020-09-23: qty 2

## 2020-09-23 NOTE — Consult Note (Signed)
[facility] Nurse Consult Note: Reason for Consult: Consult requested for buttocks/sacrum and right heel. Wound type: Sacrum/bilat buttocks with chronic Stage 3 pressure injuries; scattered across the affected location in patchy areas separated by narrow bridges of skin. Entire area is approx 7X5X.3cm, 80% red, 20% yellow, mod amt tan drainage, no odor or fluctuance. Right heel with dark red-purple deep tissue pressure injury; .3X.3cm Pressure Injury POA: Yes Dressing procedure/placement/frequency: Float heels to reduce pressure. Topical treatment orders provided for bedside nurses to perform as follows: Apply piece of Aquacel to buttocks/sacrum wounds Q day, then cover with foam dressing.  ( Change foam dressing Q 3 days or PRN soiling.) Please re-consult if further assistance is needed.  Thank-you,  Julien Girt MSN, [place], [place], [place], [place]

## 2020-09-23 NOTE — Evaluation (Signed)
Physical Therapy Evaluation Patient Details Name: Keith Reynolds MRN: [id number] DOB: 10-05-1940 Today's Date: 09/23/2020   History of Present Illness  Keith Reynolds is a 80 y.o. male with medical history significant for DM, HTN, recently diagnosed colorectal cancer, with known colovesical fistula with indwelling Foley, recently hospitalized from 5/19-5/25 for persistent diarrhea and severe hyponatremia, improved and discharged to SNF who was in his usual state of health until the morning of admission when he had an outpatient doctor's appointment however by the afternoon he was noted to be lethargic at the facility, febrile at 101.2 with O2 sats 80% on room air.  Requiring [facility] for transport with EMS.  Patient has been having continued watery diarrhea and poor appetite but no vomiting.  He has suprapubic abdominal pain with mild to moderate intensity, nonradiating.  Has some cough and shortness of breath but no chest pain.  He has generalized weakness.  Clinical Impression  Patient received in bed, daughter, [name] at bedside. Patient agreeable to PT assessment.  Patient is globally weak in B LEs. He has minimal movement in bed of B LEs. Requires mod assist for rolling in bed and assist to reach bed rails. Patient is open to continuing LE strengthening exercises and mobility as able at this time. Will continue to follow patient for improved strength and functional independence.     Follow Up Recommendations SNF    Equipment Recommendations  None recommended by PT    Recommendations for Other Services       Precautions / Restrictions Precautions Precautions: Fall Restrictions Weight Bearing Restrictions: No      Mobility  Bed Mobility Overal bed mobility: Needs Assistance Bed Mobility: Rolling Rolling: Mod assist         General bed mobility comments: declined attempt to sit on side of bed.    Transfers                    Ambulation/Gait                 Stairs            Wheelchair Mobility    Modified Rankin (Stroke Patients Only)       Balance                                             Pertinent Vitals/Pain Pain Assessment: No/denies pain    Home Living Family/patient expects to be discharged to:: [place]: Walker - 4 wheels      Prior Function           Comments: was walking 1x per day at facility with assistance.     Hand Dominance        Extremity/Trunk Assessment   Upper Extremity Assessment Upper Extremity Assessment: Defer to OT evaluation    Lower Extremity Assessment Lower Extremity Assessment: Generalized weakness       Communication   Communication: HOH  Cognition Arousal/Alertness: Awake/alert Behavior During Therapy: WFL for tasks assessed/performed Overall Cognitive Status: Within Functional Limits for tasks assessed  General Comments      Exercises     Assessment/Plan    PT Assessment Patient needs continued PT services  PT Problem List Decreased strength;Decreased activity tolerance;Decreased balance;Decreased mobility;Decreased coordination;Decreased knowledge of use of DME;Decreased safety awareness;Decreased knowledge of precautions;Obesity       PT Treatment Interventions Therapeutic exercise;Functional mobility training;Therapeutic activities;Patient/family education    PT Goals (Current goals can be found in the Care Plan section)  Acute Rehab PT Goals Patient Stated Goal: to discuss comfort care, but open to PT for exercises and mobility as able at this time. PT Goal Formulation: With patient/family Time For Goal Achievement: 10/07/20 Potential to Achieve Goals: Fair    Frequency Min 2X/week   Barriers to discharge        Co-evaluation               AM-PAC PT "6 Clicks" Mobility  Outcome Measure Help needed turning from  your back to your side while in a flat bed without using bedrails?: A Lot Help needed moving from lying on your back to sitting on the side of a flat bed without using bedrails?: A Lot Help needed moving to and from a bed to a chair (including a wheelchair)?: Total Help needed standing up from a chair using your arms (e.g., wheelchair or bedside chair)?: Total Help needed to walk in hospital room?: Total Help needed climbing 3-5 steps with a railing? : Total 6 Click Score: 8    End of Session Equipment Utilized During Treatment: Oxygen Activity Tolerance: Patient limited by lethargy Patient left: in bed;with bed alarm set;with call bell/phone within reach;with family/visitor present Nurse Communication: Mobility status PT Visit Diagnosis: Muscle weakness (generalized) (M62.81);Other abnormalities of gait and mobility (R26.89);Adult, failure to thrive (R62.7)    Time: 1110-1134 PT Time Calculation (min) (ACUTE ONLY): 24 min   Charges:   PT Evaluation $PT Eval Moderate Complexity: 1 Mod PT Treatments $Therapeutic Activity: 8-22 mins        Charlotte Brafford, PT, GCS 09/23/20,11:43 AM

## 2020-09-23 NOTE — Consult Note (Addendum)
ANTICOAGULATION CONSULT NOTE   Pharmacy Consult for heparin infusion Indication: pulmonary embolus  Allergies  Allergen Reactions   Antihistamines, Diphenhydramine-Type    Bactrim [Sulfamethoxazole-Trimethoprim] Nausea Only   Chamomile Other (See Comments)   Diphenhydramine Hcl Other (See Comments)    dizziness   Gabapentin    Levomenol    Pseudoephedrine Hcl Other (See Comments)    dizziness   Rosuvastatin    Triprolidine Hcl Other (See Comments)    dizziness    Patient Measurements: Height: 5\' 7"  (170.2 cm) Weight: 90.7 kg (199 lb 15.3 oz) IBW/kg (Calculated) : 66.1 Heparin Dosing Weight: 85 kg   Vital Signs: Temp: 98.4 F (36.9 C) (06/12 1933) Temp Source: Oral (06/12 1933) BP: 125/66 (06/12 1933) Pulse Rate: 89 (06/12 1933)  Labs: Recent Labs    09/20/20 1710 09/20/20 1910 09/21/20 0500 09/21/20 1613 09/22/20 0203 09/22/20 0613 09/22/20 1620 09/23/20 0101  HGB 9.6*  --  9.2*  --  8.4*  --   --   --   HCT 28.7*  --  28.1*  --  25.8*  --   --   --   PLT 210  --  209  --  202  --   --   --   APTT 39*  --   --   --   --   --   --   --   LABPROT 17.7*  --  16.7*  --   --   --   --   --   INR 1.5*  --  1.4*  --   --   --   --   --   HEPARINUNFRC  --   --  <0.10*   < > <0.10* <0.10* 0.67 <0.10*  CREATININE 0.47*  --  0.46*  --  0.47*  --   --   --   CKTOTAL  --   --   --   --   --   --  9*  --   TROPONINIHS 9 7  --   --   --   --   --   --    < > = values in this interval not displayed.     Estimated Creatinine Clearance: 79.1 mL/min (A) (by C-G formula based on SCr of 0.47 mg/dL (L)).   Medical History: Past Medical History:  Diagnosis Date   Acute urinary retention 09/04/2020   Chronic venous stasis 05/28/2017   Colon cancer ([place])    reported by patient and wife   Diarrhea 09/04/2020   Fracture    right forearm, tibia/fibula   Gait instability 05/28/2017   Heart murmur, systolic 7/93/9030   Hernia, abdominal    History of diabetes mellitus  05/28/2017   Hypertension    Hypokalemia 08/29/2020   Hyponatremia 09/04/2020   Malnutrition of moderate degree 08/30/2020   Obesity (BMI 30-39.9) 05/28/2017   Pressure injury of skin 08/30/2020    Medications:  No prior anticoagulation noted  Assessment: 80 y.o. male presents from a long-term care facility via EMS complaining of of shortness. Found to have bilateral pulmonary emboli on CT. Pharmacy has been consulted for initiation and management of heparin infusion for PE.  Baseline aPTT 36, INR 1.5   6/11 0500 HL <0.10, rate increased to 1650 units/hr 6/11 1613 HL 0.69, therapeutic x 1 6/12 0203 HL  < 0.1, subtherapeutic 6/12 0613 HL  < 0.1, subtherapeutic, rate increased to 1950 units/hr 6/12 1620 HL 0.67 therapeutic x1 6/13 0139 HL  <  0.1, subtherapeutic   This afternoon, heparin level is in therapeutic range at 0.67 after infusion rate increase this morning. Hgb and platelets trended down this morning. No issues with the infusion or overt bleeding noted per RN.  Goal of Therapy:  Heparin level 0.3-0.7 units/ml Monitor platelets by anticoagulation protocol: Yes   Plan:  6/13:  HL @ 0139 = < 0.1 RN states pt went for MRI from 1045 pm - 1230 but she switched his tubing to MRI tubing so there should not have been interruption in heparin drip.  Will order Heparin 2500 units IV X 1 bolus and increase drip rate to 2250 units/hr.  Will recheck HL 8 hrs after rate change.   Grasiela Jonsson D Clinical Pharmacist  09/23/2020   3:13 AM

## 2020-09-23 NOTE — Progress Notes (Signed)
[place]  This patient is currently enrolled in [facility] outpatient-based Palliative Care. Will continue to follow for disposition.  Please call with any outpatient palliative questions or concerns.  Thank you, Lorelee Market, LPN [facility] Liaison [phone]

## 2020-09-23 NOTE — Progress Notes (Addendum)
[facility]) [facility] Liaison note:  Received request from Quinn Axe, NP to meet with patient, his wife, Stanton Kidney and his daughter, [name] to explain Hospice services. Discussed services provided by hospice.   Patient/family plan to discuss information related to hospice services and decide if they would like to proceed. They would also like to speak with Loletha Grayer, LCSW TOC manager to decide if patient would like to go home, SNF or ALF at discharge. Notified TOC manager.   Please let me know if you have any questions or concerns.   Thank you for the opportunity to participate in this patient's care.  Bobbie "Loren Racer, RN, BSN [facility] Liaison [phone]

## 2020-09-23 NOTE — Progress Notes (Signed)
Neurology Progress Note  Patient ID: Keith Reynolds is a 80 y.o. with PMHx of recently diagnosed colon cancer (C/B diarrhea, hypokalemia, hyponatremia, recent weight loss, and acute urinary retention), hypertension, hyperlipidemia, diabetes, obesity (BMI 31.32).  Major interval events/Subjective: - Concerns for increasing scrotal edema overnight - Family and patient considering transition to comfort care - Subjectively feels that vision and condition overall stable  Current Facility-Administered Medications:    acetaminophen (TYLENOL) tablet 650 mg, 650 mg, Oral, Q6H PRN **OR** acetaminophen (TYLENOL) suppository 650 mg, 650 mg, Rectal, Q6H PRN, Athena Masse, MD   ceFEPIme (MAXIPIME) 2 g in sodium chloride 0.9 % 100 mL IVPB, 2 g, Intravenous, Q8H, Judd Gaudier V, MD, Last Rate: 200 mL/hr at 09/22/20 2136, 2 g at 09/22/20 2136   Chlorhexidine Gluconate Cloth 2 % PADS 6 each, 6 each, Topical, Daily, Judd Gaudier V, MD, 6 each at 09/22/20 1015   heparin ADULT infusion 100 units/mL (25000 units/272mL), 2,250 Units/hr, Intravenous, Continuous, Amery, Sahar, MD, Last Rate: 22.5 mL/hr at 09/23/20 0334, 2,250 Units/hr at 09/23/20 0334   HYDROcodone-acetaminophen (NORCO/VICODIN) 5-325 MG per tablet 1-2 tablet, 1-2 tablet, Oral, Q4H PRN, Athena Masse, MD, 2 tablet at 09/22/20 2130   MEDLINE mouth rinse, 15 mL, Mouth Rinse, BID, Judd Gaudier V, MD, 15 mL at 09/23/20 0844   metroNIDAZOLE (FLAGYL) IVPB 500 mg, 500 mg, Intravenous, Q8H, Judd Gaudier V, MD, Last Rate: 100 mL/hr at 09/23/20 0614, 500 mg at 09/23/20 0614   ondansetron (ZOFRAN) tablet 4 mg, 4 mg, Oral, Q6H PRN **OR** ondansetron (ZOFRAN) injection 4 mg, 4 mg, Intravenous, Q6H PRN, Athena Masse, MD   vitamin B-12 (CYANOCOBALAMIN) tablet 1,000 mcg, 1,000 mcg, Oral, Daily, Torben Soloway L, MD, 1,000 mcg at 09/23/20 0844   Exam: Vitals:   09/23/20 0403 09/23/20 0802  BP: 130/67 126/73  Pulse: 86 88  Resp: 18 16  Temp: 97.9 F  (36.6 C) 98.1 F (36.7 C)  SpO2: 96% 96%   Gen: In bed, comfortable  Resp: non-labored breathing, no grossly audible wheezing Cardiac: Perfusing extremities well  Abd: soft, nt  Neuro: MS: Awake, alert, conversant though continues to be slightly difficult to follow at times with possible paraphasic errors versus very tangential speech.  Oriented to self, situation, month, year CN: pupils equal and round 1.5 mm, tracking examiner in all directions, face symmetric.  Regarding his visual acuity, today he is able to make out both the our hand and the minute hand on the clock (which he states is better than normal for him), though he cannot distinguish the minute hand from the hour hand he is able to correctly identify the 2 possible times it could be.  He is unable to distinguish the second hand Motor: Mild failure to fully supinate bilateral upper extremities but no pronator drift.  Remains formally 2/5 bilateral hip flexion but very slightly and briefly antigravity today  Pertinent Data:  Lab Results  Component Value Date   HGBA1C 7.1 (H) 09/01/2020   Lab Results  Component Value Date   CHOL 148 05/28/2017   HDL 40 05/28/2017   LDLCALC 85 05/28/2017   TRIG 114 05/28/2017   CHOLHDL 3.7 05/28/2017     CK 9  Lab Results  Component Value Date   VITAMINB12 310 09/22/2020     Basic Metabolic Panel: Recent Labs  Lab 09/20/20 1710 09/21/20 0500 09/22/20 0203  NA 122* 124*  125* 124*  K 3.9 3.4* 3.7  CL 88* 89* 91*  CO2  27 28 30   GLUCOSE 172* 145* 180*  BUN 29* 22 25*  CREATININE 0.47* 0.46* 0.47*  CALCIUM 7.6* 7.7* 7.4*    CBC: Recent Labs  Lab 09/20/20 1710 09/21/20 0500 09/22/20 0203 09/23/20 0537  WBC 17.1* 16.9* 11.5* 7.7  NEUTROABS 14.2*  --   --   --   HGB 9.6* 9.2* 8.4* 9.0*  HCT 28.7* 28.1* 25.8* 27.4*  MCV 82.9 82.9 83.5 84.0  PLT 210 209 202 207    Coagulation Studies: Recent Labs    09/20/20 1710 09/21/20 0500  LABPROT 17.7* 16.7*  INR 1.5*  1.4*     6/12 MRI brain W/WO personally reviewed  Small area of restricted diffusion, T2 shine through vs. Subacute infarct  No abnormal enhancement  6/12 MRI C- and T- spine W/WO personally reviewed There is missing degradation but within these limits there is no significant spinal cord stenosis or clear evidence of an inflammatory process  08/29/2020 ECHO:  1. Left ventricular ejection fraction, by estimation, is 60 to 65%. The  left ventricle has normal function. The left ventricle has no regional  wall motion abnormalities. Left ventricular diastolic parameters are  consistent with Grade I diastolic  dysfunction (impaired relaxation).   2. Right ventricular systolic function is normal. The right ventricular  size is normal.   3. The mitral valve is normal in structure. No evidence of mitral valve  regurgitation.   4. The aortic valve is grossly normal. Aortic valve regurgitation is not  visualized.  - Normal biatrial sizes  Impression: 80 year old man with colon cancer and concern for colovesicular fistula presenting with sepsis.  Neurology asked to consult regarding a loss of consciousness episode which based on history provided by the daughter is not concerning for seizure.    Given lower extremity weakness, bowel and bladder concerns, additional imaging was obtained.  MRI brain, C and T-spine were overall reassuring although there is concern for potential subacute infarct.  Discussed with patient and family at bedside that this is too small to explain any of his symptoms given its location, but would recommend completing stroke work-up as below to ensure appropriate secondary prevention strategies.  Suspect all his symptoms are in fact related to the intra-abdominal process.  They did bring up that they may transition to comfort care, in which case additional work-up and medications would not be indicated.  Additionally there were complaints for subacute vision loss over the weeks,  though reassuringly vision is actually improved today compared to yesterday's exam.  Given this trajectory of his symptoms, outpatient ophthalmology examination is the most appropriate next step.  Given that vision substantially contributes to quality of life, would recommend ophthalmology evaluation even if the patient transitions to comfort care  Recommendations:  #Likely subacute infarct < 1 mm in the mid left frontal white matter, incidental  -Etiology is most likely small vessel disease given risk factors and location -No indication for antiplatelet agents given patient is on therapeutic anticoagulation.  If anticoagulation is stopped, recommend aspirin 81 mg daily if within goals of care. -Check lipid panel, initiate statin if needed for LDL goal less than 70 if within goals of care -Carotid ultrasound, may be canceled if patient's goals of care transition. -Patient has had a recent echocardiogram as above, unless there is medical concern for worsening cardiac function no need to repeat from a primary neurological perspective  #Low normal B12 -Continue 1000 mcg B12 daily for goal B12 greater than 400  #Vision complaints  -Ophthalmology evaluation  on an outpatient basis  Neurology will follow up the lipid panel and carotid ultrasound if not canceled by the primary team; otherwise we will be available on an as-needed basis going forward.  Please reconsult if new questions or concerns arise.  Lesleigh Noe MD-PhD [facility] [phone]  [facility] coverage for [facility] is from 8 AM to 4 AM in-house and 4 PM to 8 PM by telephone/video. 8 PM to 8 AM emergent questions or overnight urgent questions should be addressed to Teleneurology On-call or [facility] neurohospitalist; contact information can be found on AMION  Greater than 25 minutes were spent in care of this patient today, of which over 50% was at bedside discussing the results and plan as documented above.

## 2020-09-23 NOTE — Care Management Important Message (Signed)
Important Message  Patient Details  Name: Keith Reynolds MRN: [id number] Date of Birth: Oct 02, 1940   Medicare Important Message Given:  N/A - LOS <3 / Initial given by admissions  Initial Medicare IM reviewed with Liam Rogers, spouse by Coralie Common, Patient Access Associate on 09/22/2020 at 8:04am.     Dannette Barbara 09/23/2020, 9:14 AM

## 2020-09-23 NOTE — Evaluation (Signed)
Occupational Therapy Evaluation Patient Details Name: Keith Reynolds MRN: [id number] DOB: 07/22/1940 Today's Date: 09/23/2020    History of Present Illness Keith Reynolds is a 80 y.o. male with medical history significant for DM, HTN, recently diagnosed colorectal cancer, with known colovesical fistula with indwelling Foley, recently hospitalized from 5/19-5/25 for persistent diarrhea and severe hyponatremia, improved and discharged to SNF who was in his usual state of health until the morning of admission when he had an outpatient doctor's appointment however by the afternoon he was noted to be lethargic at the facility, febrile at 101.2 with O2 sats 80% on room air.  Requiring [facility] for transport with EMS.  Patient has been having continued watery diarrhea and poor appetite but no vomiting.  He has suprapubic abdominal pain with mild to moderate intensity, nonradiating.  Has some cough and shortness of breath but no chest pain.  He has generalized weakness.   Clinical Impression   [name] presents to OT with generalized weakness and limited endurance that impacts his engagement in self-care tasks.  Pt presents from skilled nursing facility, where he has experienced decrease in strength/endurance and increased pain recently.  Pt and his family able to participate in goal setting with OT, discussing they still have decisions to make re: hospice and discharge plans.  OT provided education re: OT role and plan of care, as well as assistance for collaborative goal-setting and chronic pain management strategies.  OT provided education re: strategies to manage pain and optimize comfort, including distraction, meaningful occupations, visualization, and deep breathing techniques.  Pt politely declined OOB assessment due to fatigue, but was receptive to education provided.  Anticipate pt requires grossly min-mod assist for seated UB ADLs and max-total assist for ADLs involving functional mobility.  Mr.  Devonshire will continue to benefit from skilled OT services in acute setting to address pain management, functional strengthening, and safety and independence in self-care tasks.  Recommend 24-hour assistance upon discharge (to include SNF, hospice, or home with 24-hour care).    Follow Up Recommendations  SNF    Equipment Recommendations  Other (comment) (TBD depending on discharge location)    Recommendations for Other Services       Precautions / Restrictions Precautions Precautions: Fall Restrictions Weight Bearing Restrictions: No      Mobility Bed Mobility               General bed mobility comments: declined attempt to sit on side of bed.    Transfers                      Balance Overall balance assessment: Needs assistance                                         ADL either performed or assessed with clinical judgement   ADL Overall ADL's : Needs assistance/impaired                                       General ADL Comments: Pt requires min-mod assist for seated UB ADLs due to generalized weakness, anticipate max-total for ADLs involving functional mobility (not assessed due to pt decline)     Vision Patient Visual Report: Blurring of vision Vision Assessment?: Vision impaired- to be further tested in functional context  Perception     Praxis      Pertinent Vitals/Pain Pain Assessment: No/denies pain Pain Location: pt denied current pain, but was receptive to education re: chronic pain management strategies Pain Intervention(s): Utilized relaxation techniques     Hand Dominance     Extremity/Trunk Assessment Upper Extremity Assessment Upper Extremity Assessment: Generalized weakness   Lower Extremity Assessment Lower Extremity Assessment: Generalized weakness       Communication     Cognition Arousal/Alertness: Lethargic Behavior During Therapy: WFL for tasks assessed/performed Overall  Cognitive Status: Within Functional Limits for tasks assessed                                 General Comments: lethargic, but able to answer all questions appropriately with extended time   General Comments       Exercises Other Exercises Other Exercises: provided education re: OT role and plan of care, self care, collaborative goal setting, DME recommendations, pain management/relaxation strategies   Shoulder Instructions      Home Living Family/patient expects to be discharged to:: Skilled nursing facility Living Arrangements: Spouse/significant other;Children Available Help at Discharge: Family;Available 24 hours/day;Skilled Nursing Facility                                    Prior Functioning/Environment Level of Independence: Needs assistance  Gait / Transfers Assistance Needed: Prior to admission, was walking 1x/day at facility with assistance ADL's / Homemaking Assistance Needed: Has had rapid decline in independence/engagement in self-care tasks, currently receiving assistance for all ADLs   Comments: Enjoys walking, history, volunteering at museums        OT Problem List: Decreased strength;Decreased range of motion;Decreased activity tolerance;Impaired balance (sitting and/or standing);Impaired vision/perception;Decreased coordination;Decreased safety awareness;Decreased knowledge of use of DME or AE;Decreased knowledge of precautions;Pain;Increased edema      OT Treatment/Interventions: Self-care/ADL training;Therapeutic exercise;Energy conservation;DME and/or AE instruction;Therapeutic activities;Visual/perceptual remediation/compensation;Patient/family education;Balance training    OT Goals(Current goals can be found in the care plan section) Acute Rehab OT Goals Patient Stated Goal: to be comfortable OT Goal Formulation: With patient/family Time For Goal Achievement: 10/07/20 Potential to Achieve Goals: Fair  OT Frequency: Min  1X/week   Barriers to D/C: Decreased caregiver support  pt's family at risk for injury without assistance after discharge       Co-evaluation              AM-PAC OT "6 Clicks" Daily Activity     Outcome Measure Help from another person eating meals?: A Little Help from another person taking care of personal grooming?: A Lot Help from another person toileting, which includes using toliet, bedpan, or urinal?: A Lot Help from another person bathing (including washing, rinsing, drying)?: A Lot Help from another person to put on and taking off regular upper body clothing?: A Little Help from another person to put on and taking off regular lower body clothing?: A Lot 6 Click Score: 14   End of Session    Activity Tolerance: Patient limited by lethargy Patient left: in bed;with call bell/phone within reach;with bed alarm set;with family/visitor present  OT Visit Diagnosis: Muscle weakness (generalized) (M62.81);Low vision, both eyes (H54.2)                Time: 6384-6659 OT Time Calculation (min): 14 min Charges:  OT General Charges $OT Visit: 1 Visit  Jeneen Montgomery, OTR/L 09/23/20, 4:33 PM

## 2020-09-23 NOTE — Progress Notes (Signed)
No C PROGRESS NOTE    Keith Reynolds  WNI:[id number] DOB: 19-Jan-1941 DOA: 09/20/2020 PCP: Sofie Hartigan, MD    Brief Narrative:  Keith Reynolds is a 80 y.o. male with medical history significant for DM, HTN, recently diagnosed colorectal cancer, with known colovesical fistula with indwelling Foley, recently hospitalized from 5/19-5/25 for persistent diarrhea and severe hyponatremia, improved and discharged to SNF who was in his usual state of health until the morning of admission when he had an outpatient doctor's appointment however by the afternoon he was noted to be lethargic at the facility, febrile at 101.2 with O2 sats 80% on room air.  Requiring NRB for transport with EMS.  Patient has been having continued watery diarrhea and poor appetite but no vomiting.  He has suprapubic abdominal pain with mild to moderate intensity, nonradiating.  Has some cough and shortness of breath but no chest pain.  He has generalized weakness. ED course: On arrival, tachycardic at 113, tachypneic at 22 with O2 sat of 95 on nonrebreather.  Afebrile with BP 117/60  CTA chest with bilateral pulmonary emboli without evidence of right heart strain, moderate left pleural effusion among other findings CT abdomen and pelvis with large complex sigmoid colonic mass compatible with colon cancer without evidence of bowel obstruction, colovesical fistula, mild bilateral hydronephrosis, improved from prior study among other findings  6/11- c/o sob. No cp.  6/12-daughter at bedside.  Today she reports patient had seizure-like activity 2 days prior to coming to the hospital which was witnessed by PT at his SNF.  No seizure activity has been noted since admission.  She reports he seems to be doing better. Pt somewhat sleepy during my exam. He thinks he is feeling better.  6/13 at bedside.  C/o scrotum swelling. MRI results reviewed with pt and daughter.  Palliative care was consulted per daughter's request. He is  considering comfort measures and hospice.  Consultants:    Procedures:   Antimicrobials:  Cefepime,  metronidazole, vancomycin   Subjective: Feels sob little better, no even close to baseline. Still on 4L [place]  Objective: Vitals:   09/22/20 1522 09/22/20 1933 09/23/20 0403 09/23/20 0802  BP: 129/65 125/66 130/67 126/73  Pulse: 88 89 86 88  Resp: 17 18 18 16   Temp: 98.4 F (36.9 C) 98.4 F (36.9 C) 97.9 F (36.6 C) 98.1 F (36.7 C)  TempSrc: Oral Oral  Oral  SpO2: 95% 94% 96% 96%  Weight:      Height:        Intake/Output Summary (Last 24 hours) at 09/23/2020 0821 Last data filed at 09/23/2020 0819 Gross per 24 hour  Intake 483.18 ml  Output 2550 ml  Net -2066.82 ml   Filed Weights   09/20/20 1705  Weight: 90.7 kg    Examination: Calm, pale, tired and frail looking' Decrease bs no wheezing, scattered rales  Regular s1/s2 no gallop Soft benign +bs Mild edema b/l   Data Reviewed: I have personally reviewed following labs and imaging studies  CBC: Recent Labs  Lab 09/20/20 1710 09/21/20 0500 09/22/20 0203 09/23/20 0537  WBC 17.1* 16.9* 11.5* 7.7  NEUTROABS 14.2*  --   --   --   HGB 9.6* 9.2* 8.4* 9.0*  HCT 28.7* 28.1* 25.8* 27.4*  MCV 82.9 82.9 83.5 84.0  PLT 210 209 202 381   Basic Metabolic Panel: Recent Labs  Lab 09/20/20 1710 09/21/20 0500 09/22/20 0203  NA 122* 124*  125* 124*  K 3.9 3.4* 3.7  CL 88* 89* 91*  CO2 27 28 30   GLUCOSE 172* 145* 180*  BUN 29* 22 25*  CREATININE 0.47* 0.46* 0.47*  CALCIUM 7.6* 7.7* 7.4*   GFR: Estimated Creatinine Clearance: 79.1 mL/min (A) (by C-G formula based on SCr of 0.47 mg/dL (L)). Liver Function Tests: Recent Labs  Lab 09/20/20 1710  AST 15  ALT 9  ALKPHOS 96  BILITOT 1.4*  PROT 5.0*  ALBUMIN 2.1*   No results for input(s): LIPASE, AMYLASE in the last 168 hours. No results for input(s): AMMONIA in the last 168 hours. Coagulation Profile: Recent Labs  Lab 09/20/20 1710 09/21/20 0500   INR 1.5* 1.4*   Cardiac Enzymes: Recent Labs  Lab 09/22/20 1620  CKTOTAL 9*   BNP (last 3 results) No results for input(s): PROBNP in the last 8760 hours. HbA1C: No results for input(s): HGBA1C in the last 72 hours. CBG: No results for input(s): GLUCAP in the last 168 hours. Lipid Profile: No results for input(s): CHOL, HDL, LDLCALC, TRIG, CHOLHDL, LDLDIRECT in the last 72 hours. Thyroid Function Tests: No results for input(s): TSH, T4TOTAL, FREET4, T3FREE, THYROIDAB in the last 72 hours. Anemia Panel: Recent Labs    09/22/20 1620  VITAMINB12 310   Sepsis Labs: Recent Labs  Lab 09/20/20 1711 09/20/20 1910 09/21/20 0500  PROCALCITON  --   --  0.35  LATICACIDVEN 1.0 1.0  --     Recent Results (from the past 240 hour(s))  Blood Culture (routine x 2)     Status: None (Preliminary result)   Collection Time: 09/20/20  5:11 PM   Specimen: BLOOD  Result Value Ref Range Status   Specimen Description BLOOD BLOOD RIGHT FOREARM  Final   Special Requests   Final    BOTTLES DRAWN AEROBIC AND ANAEROBIC Blood Culture results may not be optimal due to an inadequate volume of blood received in culture bottles   Culture   Final    NO GROWTH 3 DAYS Performed at [facility], [address], [place], [place] [address]    Report Status PENDING  Incomplete  Blood Culture (routine x 2)     Status: None (Preliminary result)   Collection Time: 09/20/20  5:11 PM   Specimen: BLOOD  Result Value Ref Range Status   Specimen Description BLOOD RIGHT ANTECUBITAL  Final   Special Requests   Final    BOTTLES DRAWN AEROBIC AND ANAEROBIC Blood Culture results may not be optimal due to an inadequate volume of blood received in culture bottles   Culture   Final    NO GROWTH 3 DAYS Performed at [facility], [address], [place], [place] [address]    Report Status PENDING  Incomplete  Resp Panel by RT-PCR (Flu A&B, Covid) Nasopharyngeal Swab     Status: None   Collection  Time: 09/20/20  5:18 PM   Specimen: Nasopharyngeal Swab; Nasopharyngeal(NP) swabs in vial transport medium  Result Value Ref Range Status   SARS Coronavirus 2 by RT PCR NEGATIVE NEGATIVE Final    Comment: (NOTE) SARS-CoV-2 target nucleic acids are NOT DETECTED.  The SARS-CoV-2 RNA is generally detectable in upper respiratory specimens during the acute phase of infection. The lowest concentration of SARS-CoV-2 viral copies this assay can detect is 138 copies/mL. A negative result does not preclude SARS-Cov-2 infection and should not be used as the sole basis for treatment or other patient management decisions. A negative result may occur with  improper specimen collection/handling, submission of specimen other than  nasopharyngeal swab, presence of viral mutation(s) within the areas targeted by this assay, and inadequate number of viral copies(<138 copies/mL). A negative result must be combined with clinical observations, patient history, and epidemiological information. The expected result is Negative.  Fact Sheet for Patients:  EntrepreneurPulse.com.au  Fact Sheet for Healthcare Providers:  IncredibleEmployment.be  This test is no t yet approved or cleared by the [place] FDA and  has been authorized for detection and/or diagnosis of SARS-CoV-2 by FDA under an Emergency Use Authorization (EUA). This EUA will remain  in effect (meaning this test can be used) for the duration of the COVID-19 declaration under Section 564(b)(1) of the Act, 21 U.S.C.section 360bbb-3(b)(1), unless the authorization is terminated  or revoked sooner.       Influenza A by PCR NEGATIVE NEGATIVE Final   Influenza B by PCR NEGATIVE NEGATIVE Final    Comment: (NOTE) The Xpert Xpress SARS-CoV-2/FLU/RSV plus assay is intended as an aid in the diagnosis of influenza from Nasopharyngeal swab specimens and should not be used as a sole basis for treatment. Nasal washings  and aspirates are unacceptable for Xpert Xpress SARS-CoV-2/FLU/RSV testing.  Fact Sheet for Patients: EntrepreneurPulse.com.au  Fact Sheet for Healthcare Providers: IncredibleEmployment.be  This test is not yet approved or cleared by the [place] FDA and has been authorized for detection and/or diagnosis of SARS-CoV-2 by FDA under an Emergency Use Authorization (EUA). This EUA will remain in effect (meaning this test can be used) for the duration of the COVID-19 declaration under Section 564(b)(1) of the Act, 21 U.S.C. section 360bbb-3(b)(1), unless the authorization is terminated or revoked.  Performed at [facility], [address], [place], [place] [address]   Urine culture     Status: Abnormal   Collection Time: 09/21/20  5:01 AM   Specimen: In/Out Cath Urine  Result Value Ref Range Status   Specimen Description   Final    IN/OUT CATH URINE Performed at [facility], [address] [place], [place] [address]    Special Requests   Final    NONE Performed at [facility], [address] [place], [place] [address]    Culture MULTIPLE SPECIES PRESENT, SUGGEST RECOLLECTION (A)  Final   Report Status 09/22/2020 FINAL  Final  C Difficile Quick Screen w PCR reflex     Status: None   Collection Time: 09/22/20  9:00 AM   Specimen: STOOL  Result Value Ref Range Status   C Diff antigen NEGATIVE NEGATIVE Final   C Diff toxin NEGATIVE NEGATIVE Final   C Diff interpretation No C. difficile detected.  Final    Comment: Performed at [facility], [address], [place], [place] [address]         Radiology Studies: MR BRAIN W WO CONTRAST  Result Date: 09/23/2020 CLINICAL DATA:  Initial evaluation for metastatic disease evaluation. EXAM: MRI HEAD WITHOUT AND WITH CONTRAST TECHNIQUE: Multiplanar, multiecho pulse sequences of the brain and surrounding structures were obtained without and with  intravenous contrast. CONTRAST:  7.57mL GADAVIST GADOBUTROL 1 MMOL/ML IV SOLN COMPARISON:  None available. FINDINGS: Brain: Diffuse prominence of the CSF containing spaces compatible with generalized cerebral atrophy. 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 No associated FLAIR or susceptibility artifact. 9 mm focus of diffusion abnormality seen involving the subcortical aspect of the left frontal centrum semi ovale (series 2,  image 39). Associated FLAIR signal abnormality without definite ADC correlate. No associated enhancement. Finding favored to reflect an evolving subacute white matter infarct. No associated hemorrhage or mass effect. No other diffusion abnormality to suggest acute or subacute ischemia. Gray-white matter differentiation otherwise maintained. No other areas of encephalomalacia to suggest chronic cortical infarction. No foci of susceptibility artifact to suggest acute or chronic intracranial hemorrhage. No mass lesion, midline shift or mass effect. Mild ventricular prominence related global parenchymal volume loss without hydrocephalus. No extra-axial fluid collection. Pituitary gland and suprasellar region within normal limits. Midline structures intact and within normal limits. No abnormal enhancement or evidence for intracranial metastatic disease. Vascular: Major intracranial vascular flow voids are maintained. Skull and upper cervical spine: Craniocervical junction within normal limits. Bone marrow signal intensity within normal limits. Probable small benign hemangioma noted within the left lateral mass of C1. No other focal marrow replacing lesions. No scalp soft tissue abnormality. Sinuses/Orbits: Patient status post bilateral ocular lens replacement. Globes and orbital soft tissues demonstrate no acute finding. Mild scattered  mucosal thickening noted within the ethmoidal air cells. Paranasal sinuses are otherwise largely clear. Small bilateral mastoid effusions. Visualized nasopharynx is unremarkable. Inner ear structures grossly normal. Other: None. IMPRESSION: 1. 9 mm focus of diffusion abnormality involving the subcortical aspect of the left frontal centrum semi ovale, favored to reflect an evolving subacute white matter infarct. No associated hemorrhage or mass effect. 2. No other acute intracranial abnormality. No evidence for intracranial metastatic disease. 3. 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 Electronically Signed   By: Jeannine Boga M.D.   On: 09/23/2020 01:35   MR CERVICAL SPINE W WO CONTRAST  Result Date: 09/23/2020 CLINICAL DATA:  Initial evaluation for myelopathy. History of colorectal cancer. EXAM: MRI CERVICAL SPINE WITHOUT AND WITH CONTRAST TECHNIQUE: Multiplanar and multiecho pulse sequences of the cervical spine, to include the craniocervical junction and cervicothoracic junction, were obtained without and with intravenous contrast. CONTRAST:  7.4mL GADAVIST GADOBUTROL 1 MMOL/ML IV SOLN COMPARISON:  None available. FINDINGS: Alignment: Examination moderately degraded by motion artifact. Straightening of the normal cervical lordosis. No significant listhesis. Vertebrae: Vertebral body height maintained without acute or chronic fracture. Bone marrow signal intensity somewhat diffusely decreased on T1 weighted imaging, nonspecific, but most commonly related to anemia, smoking, or obesity. Single 8 mm T1/T2 hyperintense lesion partially visualized within the left lateral mass of C1, nonspecific, but favored to reflect an atypical hemangioma. No other discrete or worrisome osseous lesions. No other abnormal marrow edema or enhancement. Cord: Signal intensity within the  cervical spinal cord is grossly within normal limits on this motion degraded exam. No convincing cord signal change or abnormal enhancement. Posterior Fossa, vertebral arteries, paraspinal tissues: Craniocervical junction within normal limits. Diffuse edema noted within the posterior paraspinous soft tissues of the partially visualized upper back, mild specific, but could be related to overall volume status. No visible loculated collections. Prevertebral soft tissues within normal limits. Normal flow voids seen within the vertebral arteries bilaterally. Disc levels: C2-C3: Left eccentric disc bulge with associated left-sided uncinate spurring. Mild bilateral facet hypertrophy. No significant spinal stenosis. Moderate to severe left C3 foraminal stenosis. Right neural foramina remains patent. C3-C4: Disc bulge with bilateral uncovertebral hypertrophy. Left-sided facet degeneration. Mild spinal stenosis. Severe bilateral C4 foraminal narrowing. C4-C5: Disc bulge with uncovertebral hypertrophy. Secondary flattening and partial effacement of the ventral thecal sac, slightly asymmetric to the  right. Superimposed facet degeneration. Resultant moderate spinal stenosis. Severe left worse than right C5 foraminal narrowing. C5-C6: Degenerative intervertebral disc space narrowing with diffuse disc bulge, with uncovertebral and endplate spurring. Posterior disc osteophyte flattens and effaces the ventral thecal sac. Moderate spinal stenosis with mild cord flattening, but no definite cord signal changes. Superimposed mild facet hypertrophy. Severe right worse than left C6 foraminal narrowing. C6-C7: Diffuse disc bulge with uncovertebral and endplate spurring, asymmetric to the right. Flattening and effacement of the ventral thecal sac. Resultant moderate to severe spinal stenosis. Thecal sac measures 7 mm in AP diameter. Mild cord flattening without definite cord signal changes. Severe right worse than left C7 foraminal  narrowing. C7-T1: Grossly negative interspace. No visible spinal stenosis. Foramina appear patent. IMPRESSION: 1. Technically limited exam due to extensive motion artifact. 2. Normal MRI appearance of the cervical spinal cord. No cord signal changes to suggest myelopathy. No evidence for metastatic disease within the cervical spine. Multilevel cervical spondylosis with resultant diffuse spinal stenosis at C3-4 through C6-7, moderate to severe in nature at the C6-7 level. Associated severe bilateral C4 through C7 foraminal stenosis. 3. Diffuse edema within the posterior paraspinous soft tissues of the partially visualized upper back, nonspecific, but could be related to overall volume status. Electronically Signed   By: Jeannine Boga M.D.   On: 09/23/2020 01:53   MR THORACIC SPINE W WO CONTRAST  Result Date: 09/23/2020 CLINICAL DATA:  Initial evaluation for myelopathy. History of colorectal cancer. EXAM: MRI THORACIC WITHOUT AND WITH CONTRAST TECHNIQUE: Multiplanar and multiecho pulse sequences of the thoracic spine were obtained without and with intravenous contrast. CONTRAST:  7.94mL GADAVIST GADOBUTROL 1 MMOL/ML IV SOLN COMPARISON:  None available. FINDINGS: Alignment: Mild dextroscoliosis with exaggeration of the normal thoracic kyphosis. No listhesis. Vertebrae: Mild chronic height loss with associated Schmorl's node deformity noted at the superior endplate of L1. Vertebral body height otherwise maintained without acute fracture. Underlying bone marrow signal intensity mildly decreased on T1 weighted imaging, nonspecific, but most commonly related to anemia, smoking, or obesity. Few scattered T1/T2 hyperintense lesions noted, most characteristic of a small benign hemangiomata. Largest of these measures 7 mm at the T6 vertebral body. No other discrete or worrisome osseous lesions. No abnormal marrow edema or enhancement. Cord: Signal intensity within the thoracic spinal cord is grossly within normal  limits on this technically limited exam. No convincing cord signal abnormality or abnormal enhancement. Paraspinal and other soft tissues: Mild diffuse edema within the posterior paraspinous soft tissues of the upper back. Large layering bilateral pleural effusions. Paraspinous soft tissues demonstrate no other acute finding. Disc levels: No significant disc pathology seen within the thoracic spine for age. No significant disc bulge or focal disc herniation. Mild for age lower thoracic facet hypertrophy. No significant spinal stenosis. Foramina appear patent. No impingement. IMPRESSION: 1. Technically limited exam due to motion artifact. 2. Grossly normal MRI appearance of the thoracic spinal cord. No cord signal changes to suggest myelopathy. No evidence for metastatic disease. 3. No significant disc pathology or stenosis. No evidence for neural impingement. 4. Large layering bilateral pleural effusions. 5. Mild diffuse edema within the posterior paraspinous soft tissues of the upper back, nonspecific, but could be related to overall volume status. Electronically Signed   By: Jeannine Boga M.D.   On: 09/23/2020 02:12   US Venous Img Lower Bilateral (DVT)  Result Date: 09/21/2020 CLINICAL DATA:  Swelling, bilateral pulmonary emboli EXAM: BILATERAL LOWER EXTREMITY VENOUS DOPPLER ULTRASOUND TECHNIQUE: Gray-scale sonography with graded compression,  as well as color Doppler and duplex ultrasound were performed to evaluate the lower extremity deep venous systems from the level of the common femoral vein and including the common femoral, femoral, profunda femoral, popliteal and calf veins including the posterior tibial, peroneal and gastrocnemius veins when visible. The superficial great saphenous vein was also interrogated. Spectral Doppler was utilized to evaluate flow at rest and with distal augmentation maneuvers in the common femoral, femoral and popliteal veins. COMPARISON:  Ultrasound 08/29/2020  FINDINGS: RIGHT LOWER EXTREMITY Common Femoral Vein: No evidence of thrombus. Normal compressibility, respiratory phasicity and response to augmentation. Saphenofemoral Junction: No evidence of thrombus. Normal compressibility and flow on color Doppler imaging. Profunda Femoral Vein: No evidence of thrombus. Normal compressibility and flow on color Doppler imaging. Femoral Vein: No evidence of thrombus. Normal compressibility, respiratory phasicity and response to augmentation. Popliteal Vein: No evidence of thrombus. Normal compressibility, respiratory phasicity and response to augmentation. Calf Veins: No evidence of thrombus. Normal compressibility and flow on color Doppler imaging. Superficial Great Saphenous Vein: No evidence of thrombus. Normal compressibility. Venous Reflux:  None. Other Findings:  Lower extremity edema. LEFT LOWER EXTREMITY Common Femoral Vein: Nonocclusive thrombus is present. Normal respiratory phasicity. Saphenofemoral Junction: Nonocclusive thrombus is present. Profunda Femoral Vein: Nonocclusive thrombus is present. Femoral Vein: Nearly occlusive thrombus in the proximal femoral vein. The mid and distal femoral vein is patent. Popliteal Vein: No evidence of thrombus. Calf Veins: Nonocclusive thrombus is present in the anterior and posterior tibial veins and the peroneal vein without compressibility. Superficial Great Saphenous Vein: Patent. Venous Reflux:  Not applicable. Other Findings:  Lower extremity edema. IMPRESSION: Positive for DVT in the left lower extremity with nonocclusive thrombus in the common femoral vein, saphenofemoral junction, profundus femoris, proximal femoral vein, and calf veins. Thrombus in the proximal femoral vein appears nearly occlusive, though mid and distal femoral vein are patent. No evidence of DVT in the right lower extremity. Electronically Signed   By: Maurine Simmering   On: 09/21/2020 11:40        Scheduled Meds:  Chlorhexidine Gluconate Cloth  6  each Topical Daily   mouth rinse  15 mL Mouth Rinse BID   vitamin B-12  1,000 mcg Oral Daily   Continuous Infusions:  ceFEPime (MAXIPIME) IV 2 g (09/22/20 2136)   heparin 2,250 Units/hr (09/23/20 0334)   metronidazole 500 mg (09/23/20 3295)    Assessment & Plan:   Principal Problem:   Bilateral pulmonary embolism (HCC) Active Problems:   Diabetes mellitus, type II ([place])   Hypertension   Hyponatremia   Diarrhea   Colorectal cancer ([place])   Colovesical fistula   Indwelling Foley catheter present   Acute respiratory failure with hypoxia ([place])   Sepsis ([place])   80 year old male with history of DM, HTN, recently diagnosed colorectal cancer, with known colovesical fistula with indwelling Foley, recently hospitalized from 5/19-5/25 for persistent diarrhea and severe hyponatremia, improved and discharged to SNF sent in for evaluation of lethargy, fever and sats 80% on room air requiring NRB for transportation.       Bilateral pulmonary embolism (HCC)   Acute respiratory failure with hypoxia ([place]) -Patient presents with shortness of breath, tachypnea with O2 sat 80% requiring NRB to maintain sats in the mid 90s -CTA chest with bilateral pulmonary emboli without evidence of right heart strain, moderate left pleural effusion among other findings Patient declined thrombectomy on admission 6/13- on 4L [place], will try to wean as tolerated.  Will give lasix 20mg  iv x1  Continue oxygen supplementation to keep O2 sat above 92%  Once oxygenation improves will switch to Eliquis  Can follow-up with hematology as outpatient if not considering hospice or comfort measures  Will hold off on thoracentesis for now as patient is considering comfort measures   Hyponatremia - Suspect hypovolemic in the setting of ongoing diarrhea, likely related to colon cancer 6/12- will dc ivf as Na 124.will keep fluid restricted 1L and see how he does. 6/13 Sodium down more. Appears to be mildly volume overload, will  give low dose lasix and monitor Na level .     Sepsis ([place])   Indwelling Foley catheter present - Patient with fever, tachycardia, hypoxia, lethargy, leukocytosis though with normal lactic acid 6/12 UA positive  Urine culture with multiple species  6/13 leukocytosis improved Procalcitonin improving No definite infiltrate on chest x-ray Will continue empiric antibiotics with IV cefepime and Flagyl Follow-up blood cultures   ?Seizure Progressive vision loss Bilateral LE weakness- reported by daughter that it witnessed by PT at SNF prior to admission.  Could be due to PE/hypoxia/hyponatremia. No hx/o seizure per daughter 6/13- neurology was consulted, input was appreciated.  Had MRI showing subacute infarct <64mm mid left frontal white matter. Etiology likely small vessel disease given her risk factor and location No indication for antiplatelets agent given patient being on therapeutic anticoagulation, if anticoagulation is stopped recommend aspirin 81 mg daily Goal LDL less than 70 Will cancel carotid ultrasound at this time since patient is thinking about transitioning to comfort care Found with low to normal B12, on B12 1000 MCG daily If need to can follow-up ophthalmology for evaluation as outpatient basis      Diabetes mellitus, type II ([place]) BG stable Continue riss     Hypertension Normotensive.  Monitor giving lasix.     Diarrhea   Colorectal cancer ([place])   Colovesical fistula Foley care Skin care 6/13C diff negative   Generalized weakness/physical deconditioning - Nutritionist evaluation and PT eval     DVT prophylaxis: IV heparin Code Status: DNR Family Communication: Daughter at bedside Disposition Plan:  Status is: Inpatient  Remains inpatient appropriate because:Inpatient level of care appropriate due to severity of illness  Dispo: The patient is from: Home              Anticipated d/c is to: Home              Patient currently is not medically  stable to d/c.   Difficult to place patient No   Continue full scope of treatment until pt and family decide if they want hospice. Hospice will meet later to talk to family         LOS: 3 days   Time spent: 45 minutes with more than 50% on [place], MD [facility] Pager [phone]  If 7PM-7AM, please contact night-coverage 09/23/2020, 8:21 AM

## 2020-09-23 NOTE — Consult Note (Signed)
ANTICOAGULATION CONSULT NOTE   Pharmacy Consult for heparin infusion Indication: pulmonary embolus  Allergies  Allergen Reactions   Antihistamines, Diphenhydramine-Type    Bactrim [Sulfamethoxazole-Trimethoprim] Nausea Only   Chamomile Other (See Comments)   Diphenhydramine Hcl Other (See Comments)    dizziness   Gabapentin    Levomenol    Pseudoephedrine Hcl Other (See Comments)    dizziness   Rosuvastatin    Triprolidine Hcl Other (See Comments)    dizziness    Patient Measurements: Height: 5\' 7"  (170.2 cm) Weight: 90.7 kg (199 lb 15.3 oz) IBW/kg (Calculated) : 66.1 Heparin Dosing Weight: 85 kg   Vital Signs: Temp: 97.9 F (36.6 C) (06/13 1214) Temp Source: Oral (06/13 1214) BP: 138/67 (06/13 1214) Pulse Rate: 90 (06/13 1214)  Labs: Recent Labs    09/20/20 1710 09/20/20 1910 09/21/20 0500 09/21/20 1613 09/22/20 0203 09/22/20 0613 09/22/20 1620 09/23/20 0101 09/23/20 0537 09/23/20 1204  HGB 9.6*  --  9.2*  --  8.4*  --   --   --  9.0*  --   HCT 28.7*  --  28.1*  --  25.8*  --   --   --  27.4*  --   PLT 210  --  209  --  202  --   --   --  207  --   APTT 39*  --   --   --   --   --   --   --   --   --   LABPROT 17.7*  --  16.7*  --   --   --   --   --   --   --   INR 1.5*  --  1.4*  --   --   --   --   --   --   --   HEPARINUNFRC  --   --  <0.10*   < > <0.10*   < > 0.67 <0.10*  --  0.13*  CREATININE 0.47*  --  0.46*  --  0.47*  --   --   --   --  0.43*  CKTOTAL  --   --   --   --   --   --  9*  --   --   --   TROPONINIHS 9 7  --   --   --   --   --   --   --   --    < > = values in this interval not displayed.     Estimated Creatinine Clearance: 79.1 mL/min (A) (by C-G formula based on SCr of 0.43 mg/dL (L)).   Medical History: Past Medical History:  Diagnosis Date   Acute urinary retention 09/04/2020   Chronic venous stasis 05/28/2017   Colon cancer ([place])    reported by patient and wife   Diarrhea 09/04/2020   Fracture    right forearm,  tibia/fibula   Gait instability 05/28/2017   Heart murmur, systolic 0/86/5784   Hernia, abdominal    History of diabetes mellitus 05/28/2017   Hypertension    Hypokalemia 08/29/2020   Hyponatremia 09/04/2020   Malnutrition of moderate degree 08/30/2020   Obesity (BMI 30-39.9) 05/28/2017   Pressure injury of skin 08/30/2020    Medications:  No prior anticoagulation noted Heparin Dosing Weight: 85 kg   Assessment: 80 y.o. male presents from a long-term care facility via EMS complaining of of shortness. Found to have bilateral pulmonary emboli on CT. Pharmacy has been  consulted for initiation and management of heparin infusion for PE.  Baseline aPTT 36, INR 1.5   6/11 0500 HL <0.10, rate increased to 1650 units/hr 6/11 1613 HL 0.69, therapeutic x 1 6/12 0203 HL  < 0.1, subtherapeutic 6/12 0613 HL  < 0.1, subtherapeutic, rate increased to 1950 units/hr 6/12 1620 HL 0.67 therapeutic x1 6/13 0139 HL  < 0.1, subtherapeutic (Per RN, pt to MRI 1045 pm - 1230 & switched to MRI tubing so potential interruption in heparin drip. ) 6/13 1204 HL = 0.13; subtherapeutic  Goal of Therapy:  Heparin level 0.3-0.7 units/ml Monitor platelets by anticoagulation protocol: Yes   Plan:  6/13:  HL @ 1204 = 0.13 Will order Heparin 2500 units IV X 1 bolus and increase drip rate to 2550 units/hr.  Will recheck HL 8 hrs after rate change.   Lorna Dibble Clinical Pharmacist  09/23/2020   2:10 PM

## 2020-09-23 NOTE — Consult Note (Signed)
Consultation Note Date: 09/23/2020   Patient Name: Keith Reynolds  DOB: [birth year]  MRN: [id number]  Age / Sex: 80 y.o., male  PCP: Sofie Hartigan, MD Referring Physician: Nolberto Hanlon, MD  Reason for Consultation: Establishing goals of care and Psychosocial/spiritual support  HPI/Patient Profile: 80 y.o. male  with past medical history of DM, HTN, recent diagnosis colorectal cancer with known colovesicular fistula and an indwelling Foley catheter, hospitalized 5/19 325 discharged to SNF, admitted on 09/20/2020 with bilateral PE.   Clinical Assessment and Goals of Care: I have reviewed medical records including EPIC notes, labs and imaging, received report from RN, assessed the patient.  Mr. Sporer is lying quietly in bed.  He appears acutely/chronically ill and frail.  He is alert and oriented, able to make his needs known.  His daughter, Arin Peral, is at bedside.  I introduced Palliative Medicine as specialized medical care for people living with serious illness. It focuses on providing relief from the symptoms and stress of a serious illness. The goal is to improve quality of life for both the patient and the family.  We discussed a brief life review of the patient and then focused on their current illness. The natural disease trajectory and expectations at EOL were discussed.   They share that he has been seeing the oncology specialist at [facility] who shares that there is no surgical option, and that he is too weak for chemotherapy.  The Kirkendall family shares that Mr. Borboa has 4 days left in short-term rehab.    I attempted to elicit values and goals of care important to the patient.  We talked about options, such as returning to short-term rehab moving into her private pay day, long-term care paid upfront, home with home health and private pay aids.  Mr. Luginbill wisely asks about his ability to regain  meaningful functional status, his ability to overcome his cancer diagnosis, especially in light of his age.  The difference between aggressive medical intervention and comfort care was considered in light of the patient's goals of care.   Mr. Albrecht states that he would consider comfort measures, he is interested in talking to hospice liaison about residential hospice.  He understands that this would mean stopping blood thinner, no further testing or treatment.  He understands and repeats to me that he would be going to "let nature take its course".  Daughter [name] states that the family could abide Mr. Budney's choice for comfort measures, residential hospice.  Advanced directives, concepts specific to code status, were considered and discussed.  Mr. Volden endorses DNR.      Hospice and Palliative Care services outpatient were explained and offered.  Provider choice offered, family chooses [facility].  Hospice representative notified, and plans to meet with patient and family today.  Discussed the importance of continued conversation with family and the medical providers regarding overall plan of care and treatment options, ensuring decisions are within the context of the patient's values and GOCs.    Questions and concerns were  addressed. The family was encouraged to call with questions or concerns.  PMT will continue to support holistically.  Conference with attending, bedside nursing staff, transition of care team, hospice representative related to patient condition, needs, goals of care.    HCPOA  NEXT OF KIN    SUMMARY OF RECOMMENDATIONS   At this point treat the treatable but no CPR or intubation Considering comfort care, residential hospice in [place] PMT to follow-up 6/14   Code Status/Advance Care Planning: DNR  Symptom Management:  Per hospitalist, no additional needs at this time.  Palliative Prophylaxis:  Frequent Pain Assessment and Turn  Reposition  Additional Recommendations (Limitations, Scope, Preferences): Considering residential hospice  Psycho-social/Spiritual:  Desire for further Chaplaincy support:no Additional Recommendations: Caregiving  Support/Resources and Education on Hospice  Prognosis:  < 2 weeks, would be expected if patient and family desire to focus on comfort and dignity, let nature take its course.  Based on new diagnosis of colorectal cancer, acute bilateral PE.  Discharge Planning:  To be determined, considering residential hospice with [facility]       Primary Diagnoses: Present on Admission:  Hypertension  Hyponatremia  Diarrhea   I have reviewed the medical record, interviewed the patient and family, and examined the patient. The following aspects are pertinent.  Past Medical History:  Diagnosis Date   Acute urinary retention 09/04/2020   Chronic venous stasis 05/28/2017   Colon cancer ([place])    reported by patient and wife   Diarrhea 09/04/2020   Fracture    right forearm, tibia/fibula   Gait instability 05/28/2017   Heart murmur, systolic 5/62/1308   Hernia, abdominal    History of diabetes mellitus 05/28/2017   Hypertension    Hypokalemia 08/29/2020   Hyponatremia 09/04/2020   Malnutrition of moderate degree 08/30/2020   Obesity (BMI 30-39.9) 05/28/2017   Pressure injury of skin 08/30/2020   Social History   Socioeconomic History   Marital status: Married    Spouse name: Not on file   Number of children: Not on file   Years of education: Not on file   Highest education level: Not on file  Occupational History   Not on file  Tobacco Use   Smoking status: Former    Packs/day: 0.25    Pack years: 0.00    Types: Cigarettes    Quit date: 04/14/1983    Years since quitting: 37.4   Smokeless tobacco: Never  Vaping Use   Vaping Use: Never used  Substance and Sexual Activity   Alcohol use: No   Drug use: No   Sexual activity: Not on file  Other Topics Concern    Not on file  Social History Narrative   Not on file   Social Determinants of Health   Financial Resource Strain: Not on file  Food Insecurity: Not on file  Transportation Needs: Not on file  Physical Activity: Not on file  Stress: Not on file  Social Connections: Not on file   Family History  Problem Relation Age of Onset   Heart attack Father    Diabetes Sister    Scheduled Meds:  Chlorhexidine Gluconate Cloth  6 each Topical Daily   mouth rinse  15 mL Mouth Rinse BID   vitamin B-12  1,000 mcg Oral Daily   Continuous Infusions:  ceFEPime (MAXIPIME) IV 2 g (09/23/20 0700)   heparin 2,250 Units/hr (09/23/20 0334)   metronidazole 500 mg (09/23/20 1308)   PRN Meds:.acetaminophen **OR** acetaminophen, HYDROcodone-acetaminophen, ondansetron **OR** ondansetron (ZOFRAN) IV  Medications Prior to Admission:  Prior to Admission medications   Medication Sig Start Date End Date Taking? Authorizing Provider  acetaminophen (TYLENOL) 325 MG tablet Take 2 tablets (650 mg total) by mouth every 4 (four) hours as needed for mild pain or moderate pain (or Fever >/= 101). 09/04/20  Yes Dwyane Dee, MD  cyclobenzaprine (FLEXERIL) 10 MG tablet Take 0.5 tablets (5 mg total) by mouth 3 (three) times daily as needed for muscle spasms. 09/04/20  Yes Dwyane Dee, MD  Magnesium 250 MG TABS Take 250 mg by mouth 2 (two) times daily.   Yes [provider]  magnesium oxide (MAG-OX) 400 MG tablet Take 400 mg by mouth daily.   Yes [provider]  Multiple Vitamin (MULTIVITAMIN WITH MINERALS) TABS tablet Take 1 tablet by mouth daily. 09/05/20  Yes Dwyane Dee, MD  nystatin (MYCOSTATIN/NYSTOP) powder Apply topically 2 (two) times daily. 09/04/20  Yes Dwyane Dee, MD  potassium chloride SA (KLOR-CON) 20 MEQ tablet Take 40 mEq by mouth daily.   Yes [provider]  promethazine (PHENERGAN) 12.5 MG tablet Take 1 tablet (12.5 mg total) by mouth every 6 (six) hours as needed for  nausea. 09/04/20  Yes Dwyane Dee, MD  loperamide (IMODIUM) 2 MG capsule Take 1 capsule (2 mg total) by mouth every 4 (four) hours as needed for diarrhea or loose stools. Patient not taking: Reported on 09/20/2020 09/04/20   Dwyane Dee, MD  tamsulosin (FLOMAX) 0.4 MG CAPS capsule Take 1 capsule (0.4 mg total) by mouth daily. 09/05/20   Dwyane Dee, MD  traZODone (DESYREL) 50 MG tablet Take 0.5 tablets (25 mg total) by mouth at bedtime as needed for sleep. 09/04/20   Dwyane Dee, MD   Allergies  Allergen Reactions   Antihistamines, Diphenhydramine-Type    Bactrim [Sulfamethoxazole-Trimethoprim] Nausea Only   Chamomile Other (See Comments)   Diphenhydramine Hcl Other (See Comments)    dizziness   Gabapentin    Levomenol    Pseudoephedrine Hcl Other (See Comments)    dizziness   Rosuvastatin    Triprolidine Hcl Other (See Comments)    dizziness   Review of Systems  Physical Exam  Vital Signs: BP 138/67 (BP Location: Left Arm)   Pulse 90   Temp 97.9 F (36.6 C) (Oral)   Resp 17   Ht 5\' 7"  (1.702 m)   Wt 90.7 kg   SpO2 95%   BMI 31.32 kg/m  Pain Scale: 0-10   Pain Score: 0-No pain   SpO2: SpO2: 95 % O2 Device:SpO2: 95 % O2 Flow Rate: .O2 Flow Rate (L/min): 4 L/min  IO: Intake/output summary:  Intake/Output Summary (Last 24 hours) at 09/23/2020 1311 Last data filed at 09/23/2020 0940 Gross per 24 hour  Intake 723.18 ml  Output 2550 ml  Net -1826.82 ml    LBM: Last BM Date: 09/22/20 (per patient report) Baseline Weight: Weight: 90.7 kg Most recent weight: Weight: 90.7 kg     Palliative Assessment/Data:   Flowsheet Rows    Flowsheet Row Most Recent Value  Intake Tab   Referral Department Hospitalist  Unit at Time of Referral Cardiac/Telemetry Unit  Palliative Care Primary Diagnosis Pulmonary  Date Notified 09/21/20  Palliative Care Type New Palliative care  Reason for referral Clarify Goals of Care  Date of Admission 09/20/20  Date first seen by  Palliative Care 09/23/20  # of days Palliative referral response time 2 Day(s)  # of days IP prior to Palliative referral 1  Clinical Assessment  Palliative Performance Scale Score 40%  Pain Max last 24 hours Not able to report  Pain Min Last 24 hours Not able to report  Dyspnea Max Last 24 Hours Not able to report  Dyspnea Min Last 24 hours Not able to report  Psychosocial & Spiritual Assessment   Palliative Care Outcomes        Time In: 0930 Time Out: 1040 Time Total: 70 minutes  Greater than 50%  of this time was spent counseling and coordinating care related to the above assessment and plan.  Signed by: Drue Novel, NP   Please contact Palliative Medicine Team phone at [phone] for questions and concerns.  For individual provider: See Shea Evans

## 2020-09-24 DIAGNOSIS — N321 Vesicointestinal fistula: Secondary | ICD-10-CM

## 2020-09-24 LAB — CBC
HCT: 29.6 % — ABNORMAL LOW (ref 39.0–52.0)
Hemoglobin: 9.7 g/dL — ABNORMAL LOW (ref 13.0–17.0)
MCH: 26.9 pg (ref 26.0–34.0)
MCHC: 32.8 g/dL (ref 30.0–36.0)
MCV: 82.2 fL (ref 80.0–100.0)
Platelets: 227 10*3/uL (ref 150–400)
RBC: 3.6 MIL/uL — ABNORMAL LOW (ref 4.22–5.81)
RDW: 16.1 % — ABNORMAL HIGH (ref 11.5–15.5)
WBC: 10 10*3/uL (ref 4.0–10.5)
nRBC: 0 % (ref 0.0–0.2)

## 2020-09-24 LAB — LIPID PANEL
Cholesterol: 73 mg/dL (ref 0–200)
HDL: 32 mg/dL — ABNORMAL LOW (ref >40)
LDL Cholesterol: 28 mg/dL (ref 0–99)
Total CHOL/HDL Ratio: 2.3 RATIO
Triglycerides: 65 mg/dL (ref <150)
VLDL: 13 mg/dL (ref 0–40)

## 2020-09-24 LAB — BASIC METABOLIC PANEL
Anion gap: 6 (ref 5–15)
BUN: 21 mg/dL (ref 8–23)
CO2: 31 mmol/L (ref 22–32)
Calcium: 7.7 mg/dL — ABNORMAL LOW (ref 8.9–10.3)
Chloride: 89 mmol/L — ABNORMAL LOW (ref 98–111)
Creatinine, Ser: 0.48 mg/dL — ABNORMAL LOW (ref 0.61–1.24)
GFR, Estimated: >60 mL/min (ref >60)
Glucose, Bld: 174 mg/dL — ABNORMAL HIGH (ref 70–99)
Potassium: 4 mmol/L (ref 3.5–5.1)
Sodium: 126 mmol/L — ABNORMAL LOW (ref 135–145)

## 2020-09-24 LAB — HEPARIN LEVEL (UNFRACTIONATED)
Heparin Unfractionated: 0.36 IU/mL (ref 0.30–0.70)
Heparin Unfractionated: 0.38 IU/mL (ref 0.30–0.70)

## 2020-09-24 MED ORDER — HEPARIN BOLUS VIA INFUSION
1300.0000 [IU] | INTRAVENOUS | Status: AC
  Administered 2020-09-24: 1300 [IU] via INTRAVENOUS
  Filled 2020-09-24: qty 1300

## 2020-09-24 NOTE — Plan of Care (Signed)
Patient meeting LDL goal < 70, no indication for statin Lab Results  Component Value Date   CHOL 73 09/24/2020   HDL 32 (L) 09/24/2020   LDLCALC 28 09/24/2020   TRIG 65 09/24/2020   CHOLHDL 2.3 09/24/2020   Given ongoing goals of care discussion, primary team has cancelled carotid duplex   Neurology will be available as needed, please do consult if new questions or concerns arise.   Lesleigh Noe MD-PhD [facility] [phone]  [facility] coverage for [facility] is from 8 AM to 4 AM in-house and 4 PM to 8 PM by telephone/video. 8 PM to 8 AM emergent questions or overnight urgent questions should be addressed to Teleneurology On-call or [facility] neurohospitalist; contact information can be found on AMION

## 2020-09-24 NOTE — Consult Note (Signed)
ANTICOAGULATION CONSULT NOTE   Pharmacy Consult for heparin infusion Indication: pulmonary embolus  Allergies  Allergen Reactions   Antihistamines, Diphenhydramine-Type    Bactrim [Sulfamethoxazole-Trimethoprim] Nausea Only   Chamomile Other (See Comments)   Diphenhydramine Hcl Other (See Comments)    dizziness   Gabapentin    Levomenol    Pseudoephedrine Hcl Other (See Comments)    dizziness   Rosuvastatin    Triprolidine Hcl Other (See Comments)    dizziness    Patient Measurements: Height: 5\' 7"  (170.2 cm) Weight: 96.3 kg (212 lb 4.9 oz) IBW/kg (Calculated) : 66.1 Heparin Dosing Weight: 85 kg   Vital Signs: Temp: 97.6 F (36.4 C) (06/14 0509) Temp Source: Oral (06/14 0509) BP: 146/78 (06/14 0700) Pulse Rate: 86 (06/14 0700)  Labs: Recent Labs    09/22/20 0203 09/22/20 4034 09/22/20 1620 09/23/20 0101 09/23/20 0537 09/23/20 1204 09/23/20 2255 09/24/20 0449 09/24/20 0949  HGB 8.4*  --   --   --  9.0*  --   --  9.7*  --   HCT 25.8*  --   --   --  27.4*  --   --  29.6*  --   PLT 202  --   --   --  207  --   --  227  --   HEPARINUNFRC <0.10*   < > 0.67   < >  --  0.13* 0.25*  --  0.36  CREATININE 0.47*  --   --   --   --  0.43*  --  0.48*  --   CKTOTAL  --   --  9*  --   --   --   --   --   --    < > = values in this interval not displayed.     Estimated Creatinine Clearance: 81.5 mL/min (A) (by C-G formula based on SCr of 0.48 mg/dL (L)).   Medical History: Past Medical History:  Diagnosis Date   Acute urinary retention 09/04/2020   Chronic venous stasis 05/28/2017   Colon cancer ([place])    reported by patient and wife   Diarrhea 09/04/2020   Fracture    right forearm, tibia/fibula   Gait instability 05/28/2017   Heart murmur, systolic 7/42/5956   Hernia, abdominal    History of diabetes mellitus 05/28/2017   Hypertension    Hypokalemia 08/29/2020   Hyponatremia 09/04/2020   Malnutrition of moderate degree 08/30/2020   Obesity (BMI 30-39.9) 05/28/2017    Pressure injury of skin 08/30/2020    Medications:  No prior anticoagulation noted Heparin Dosing Weight: 85 kg   Assessment: 80 y.o. male presents from a long-term care facility via EMS complaining of shortness of breathe. Found to have bilateral pulmonary emboli on CT. Pharmacy has been consulted for initiation and management of heparin infusion for PE. Per MD, patient has some hematuria reported 6/14. Onc-heme to see patient.   6/13 0139 HL  < 0.1, subtherapeutic (Per RN, pt to MRI 1045 pm - 1230 & switched to MRI tubing so potential interruption in heparin drip. ) 6/13 1204 HL = 0.13; subtherapeutic 6/13 2255 HL = 0.25, subtherapeutic 6/14 0949 HL = 0.36, therapeutic x 1  Goal of Therapy:  Heparin level 0.3-0.7 units/ml Monitor platelets by anticoagulation protocol: Yes   Plan:  Heparin level therapeutic. Will continue heparin infusion at 2700 units/hr. Recheck heparin level in 8 hours. CBC daily while on heparin. Follow up with transition to PO anticoagulation.   [place]  Candidate at [facility]  09/24/2020 10:48 AM

## 2020-09-24 NOTE — Progress Notes (Signed)
Palliative: Mr. Mia is lying quietly in bed.  He appears acutely/chronically ill and frail.  He will make an somewhat keep eye contact.  He is alert and oriented, able to make his needs known.  His daughter, Albertus Chiarelli, is at bedside.  Mr. Kimberlin and [name] share that he initially understood that he would have more control over her life and death once he entered hospice.  He now understands that in the state of [place] we can do nothing to bring death sooner.  [name] states that they have decided to forego short-term rehab and going directly to private pay long-term care.  As we are talking, nursing staff enters sharing that Mr. Madole needs to be assisted with personal care prior to upcoming meeting with transition of care team.  He agrees for [place] and to continue conversation in the hallway.  [name] and I talk at length about Mr. Mose's goals.  She clarifies their position about going to long term care under private pay.  We talked at length about outpatient palliative services and outpatient hospice services.  We talked about comfort care being only when a person is admitted to residential hospice, 2 weeks or less.  We talked about prognosis with permission, as [name] is stating that they will reevaluate their choices in 6 months.  I share a diagram of the chronic illness pathway, what is normal and expected.  I share how this changes for those with cancer.  I share that it would not be surprising if Mr. Bonfield has 3 months or less.  Conference with attending, bedside nursing staff, transition of care team, and house hospice representative related to patient condition, needs, goals of care, disposition, outpatient palliative services.  Plan: Transition to long-term care under private pay, [facility] facility of choice.  Outpatient palliative services to follow, provider of choice [facility].  Anticipate transitioning to "treat the treatable" hospice care within the next  month.  35 minutes  Quinn Axe, NP Palliative medicine team Team phone [phone] Greater than 50% of this time was spent counseling and coordinating care related to the above assessment and plan.

## 2020-09-24 NOTE — Consult Note (Signed)
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 Found to be tachycardic, tachypneic, and hypoxic in 80%. CTA chest showed bilateral pulmonary emboli most notable in RLL without evidence of heart strain. Moderate left pleural effusion and small right pleural effusion. Patient reported LLE swelling x 3 days. [place] positive for nonocclusive DVT in common femoral vein, saphenofemoral junction, profundus femoris, proximal femoral vein, and calf veins. Thrombus in proximal femoral vein was nearly occlusive. Right leg negative for DVT. Declined thrombectomy. Heparin drip was started. Oxygen was weaned to 4L via [place].   He has had 40+ lb weight loss over past few months along with diarrhea. CT showed masslike area in sigmoid colon with thickening of small bowel suspicious for colonic neoplasm. He underwent colonoscopy on 07/22/20 to descending colon which showed a fungating, completely obstructing large mass circumferential involving 100% of lumen circumference. Submucosal non obstructing medium sided mass in the rectum. Patho: descending colon mass- sessile serrated polyp; no evidence of malignancy. 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  08/06/20- CT chest was negative for thoracic metastatic disease.  08/13/20- MRI Pelvis- sigmoid colon mass measures 15 cm with abutment and likely invasion of adjacent loop of small bowel and anterior aspect of the urinary bladder concerning for mucinous pathology. No evidence of metastatic disease in pelvis.  08/29/20- hospitalized for severe electrolyte abnormalities and discharged to SNF. Nearly bedbound at that time  and only able to get to chair with 2 person assist.  5/22- ER for acute urinary retention. Foley was placed. Patient began noticing stool visible in foley catheter suggestive of colovesical fistula.   Patient has met with palliative care and discussed supportive care, hospice, vs palliative treatment.   Today, patient who is accompanied by his daughter state that patient has no history of blood clots. Never been on anticoagulation. Increased weakness and nearly bedbound over past month. He was seen at [facility] after workup at [facility] for diarrhea and weight loss. Imaging as above showed mass suspicious for malignancy. He wasseen by Dr. Maxie Better, GI oncology on 09/19/20. Patient was unable to tolerate exam due to weakness. MD felt patient was not a candidate for treatment due to poor perofrmance status. Supportive care vs rehab to improve performance status was recommended. Follow up was left prn.     Objective:  Vitals:   09/24/20 1732 09/24/20 2005  BP: 137/64 131/70  Pulse: 89 93  Resp: (!) 22 16  Temp: (!) 97.5 F (36.4 C) 98 F (36.7 C)  SpO2: 96% 95%    Body mass index is 33.25 kg/m.  Intake/Output Summary (Last 24 hours) at 09/24/2020 2223 Last data filed at 09/24/2020 1819 Gross per 24 hour  Intake 1793.09 ml  Output 3950 ml  Net -2156.91 ml    Ill appearing, weak, in hospital bed. Hard time maintaining eye contact.   Sclerae unicteric  Oropharynx shows no thrush or other lesions  No cervical or supraclavicular adenopathy  Lungs no rales or wheezes--auscultated anterolaterally; diminished bilaterally  Heart regular rate and rhythm  Abdomen rounded; nontender. Foley in place.   Neuro nonfocal.   LLE edema 2-3+  Denies pain.     CBG (last  3)  No results for input(s): GLUCAP in the last 72 hours.   Labs:  Lab Results  Component Value Date   WBC 10.0 09/24/2020   HGB 9.7 (L) 09/24/2020   HCT 29.6 (L) 09/24/2020   MCV 82.2 09/24/2020   PLT 227 09/24/2020   NEUTROABS 14.2 (H)  09/20/2020   Urine Studies No results for input(s): UHGB, CRYS in the last 72 hours.  Invalid input(s): UACOL, UAPR, USPG, UPH, UTP, UGL, UKET, UBIL, UNIT, UROB, [place], UEPI, UWBC, [place], [place], [place], [place], [place]  Basic Metabolic Panel: Recent Labs  Lab 09/20/20 1710 09/21/20 0500 09/22/20 0203 09/23/20 1204 09/24/20 0449  NA 122* 124*  125* 124* 123* 126*  K 3.9 3.4* 3.7 3.9 4.0  CL 88* 89* 91* 90* 89*  CO2 _0 GLUCOSE 172* 145* 180* 170* 174*  BUN 29* 22 25* 24* 21  CREATININE 0.47* 0.46* 0.47* 0.43* 0.48*  CALCIUM 7.6* 7.7* 7.4* 7.6* 7.7*   GFR Estimated Creatinine Clearance: 81.5 mL/min (A) (by C-G formula based on SCr of 0.48 mg/dL (L)). Liver Function Tests: Recent Labs  Lab 09/20/20 1710  AST 15  ALT 9  ALKPHOS 96  BILITOT 1.4*  PROT 5.0*  ALBUMIN 2.1*   No results for input(s): LIPASE, AMYLASE in the last 168 hours. No results for input(s): AMMONIA in the last 168 hours. Coagulation profile Recent Labs  Lab 09/20/20 1710 09/21/20 0500  INR 1.5* 1.4*    CBC: Recent Labs  Lab 09/20/20 1710 09/21/20 0500 09/22/20 0203 09/23/20 0537 09/24/20 0449  WBC 17.1* 16.9* 11.5* 7.7 10.0  NEUTROABS 14.2*  --   --   --   --   HGB 9.6* 9.2* 8.4* 9.0* 9.7*  HCT 28.7* 28.1* 25.8* 27.4* 29.6*  MCV 82.9 82.9 83.5 84.0 82.2  PLT 210 209 202 207 227   Cardiac Enzymes: Recent Labs  Lab 09/22/20 1620  CKTOTAL 9*   BNP: Invalid input(s): POCBNP CBG: No results for input(s): GLUCAP in the last 168 hours. D-Dimer No results for input(s): DDIMER in the last 72 hours. Hgb A1c No results for input(s): HGBA1C in the last 72 hours. Lipid Profile Recent Labs    09/24/20 0449  CHOL 73  HDL 32*  LDLCALC 28  TRIG 65  CHOLHDL 2.3   Thyroid function studies No results for input(s): TSH, T4TOTAL, T3FREE, THYROIDAB in the last 72 hours.  Invalid input(s): FREET3 Anemia work up Recent Labs    09/22/20 1620  VITAMINB12 310   Microbiology Recent  Results (from the past 240 hour(s))  Blood Culture (routine x 2)     Status: None (Preliminary result)   Collection Time: 09/20/20  5:11 PM   Specimen: BLOOD  Result Value Ref Range Status   Specimen Description BLOOD BLOOD RIGHT FOREARM  Final   Special Requests   Final    BOTTLES DRAWN AEROBIC AND ANAEROBIC Blood Culture results may not be optimal due to an inadequate volume of blood received in culture bottles   Culture   Final    NO GROWTH 4 DAYS Performed at [facility], [address] [place], [place] [address]    Report Status PENDING  Incomplete  Blood Culture (routine x 2)     Status: None (Preliminary result)   Collection Time: 09/20/20  5:11 PM   Specimen: BLOOD  Result Value Ref Range Status   Specimen Description BLOOD RIGHT ANTECUBITAL  Final   Special Requests   Final  BOTTLES DRAWN AEROBIC AND ANAEROBIC Blood Culture results may not be optimal due to an inadequate volume of blood received in culture bottles   Culture   Final    NO GROWTH 4 DAYS Performed at [facility], [address], [place], [place] [address]    Report Status PENDING  Incomplete  Resp Panel by RT-PCR (Flu A&B, Covid) Nasopharyngeal Swab     Status: None   Collection Time: 09/20/20  5:18 PM   Specimen: Nasopharyngeal Swab; Nasopharyngeal(NP) swabs in vial transport medium  Result Value Ref Range Status   SARS Coronavirus 2 by RT PCR NEGATIVE NEGATIVE Final    Comment: (NOTE) SARS-CoV-2 target nucleic acids are NOT DETECTED.  The SARS-CoV-2 RNA is generally detectable in upper respiratory specimens during the acute phase of infection. The lowest concentration of SARS-CoV-2 viral copies this assay can detect is 138 copies/mL. A negative result does not preclude SARS-Cov-2 infection and should not be used as the sole basis for treatment or other patient management decisions. A negative result may occur with  improper specimen collection/handling, submission of specimen  other than nasopharyngeal swab, presence of viral mutation(s) within the areas targeted by this assay, and inadequate number of viral copies(<138 copies/mL). A negative result must be combined with clinical observations, patient history, and epidemiological information. The expected result is Negative.  Fact Sheet for Patients:  EntrepreneurPulse.com.au  Fact Sheet for Healthcare Providers:  IncredibleEmployment.be  This test is no t yet approved or cleared by the [place] FDA and  has been authorized for detection and/or diagnosis of SARS-CoV-2 by FDA under an Emergency Use Authorization (EUA). This EUA will remain  in effect (meaning this test can be used) for the duration of the COVID-19 declaration under Section 564(b)(1) of the Act, 21 U.S.C.section 360bbb-3(b)(1), unless the authorization is terminated  or revoked sooner.       Influenza A by PCR NEGATIVE NEGATIVE Final   Influenza B by PCR NEGATIVE NEGATIVE Final    Comment: (NOTE) The Xpert Xpress SARS-CoV-2/FLU/RSV plus assay is intended as an aid in the diagnosis of influenza from Nasopharyngeal swab specimens and should not be used as a sole basis for treatment. Nasal washings and aspirates are unacceptable for Xpert Xpress SARS-CoV-2/FLU/RSV testing.  Fact Sheet for Patients: EntrepreneurPulse.com.au  Fact Sheet for Healthcare Providers: IncredibleEmployment.be  This test is not yet approved or cleared by the [place] FDA and has been authorized for detection and/or diagnosis of SARS-CoV-2 by FDA under an Emergency Use Authorization (EUA). This EUA will remain in effect (meaning this test can be used) for the duration of the COVID-19 declaration under Section 564(b)(1) of the Act, 21 U.S.C. section 360bbb-3(b)(1), unless the authorization is terminated or revoked.  Performed at [facility], [address],  [place], [place] [address]   Urine culture     Status: Abnormal   Collection Time: 09/21/20  5:01 AM   Specimen: In/Out Cath Urine  Result Value Ref Range Status   Specimen Description   Final    IN/OUT CATH URINE Performed at [facility], [address] [place], [place] [address]    Special Requests   Final    NONE Performed at [facility], [address] [place], [place] [address]    Culture MULTIPLE SPECIES PRESENT, SUGGEST RECOLLECTION (A)  Final   Report Status 09/22/2020 FINAL  Final  C Difficile Quick Screen w PCR reflex     Status: None   Collection Time: 09/22/20  9:00 AM   Specimen: STOOL  Result Value Ref Range Status   C Diff antigen NEGATIVE NEGATIVE Final   C Diff toxin NEGATIVE NEGATIVE Final   C Diff interpretation No C. difficile detected.  Final    Comment: Performed at [facility], [address] [place], [place] [address]      Studies:  MR BRAIN W WO CONTRAST  Result Date: 09/23/2020 CLINICAL DATA:  Initial evaluation for metastatic disease evaluation. EXAM: MRI HEAD WITHOUT AND WITH CONTRAST TECHNIQUE: Multiplanar, multiecho pulse sequences of the brain and surrounding structures were obtained without and with intravenous contrast. CONTRAST:  7.59m GADAVIST GADOBUTROL 1 MMOL/ML IV SOLN COMPARISON:  None available. FINDINGS: Brain: Diffuse prominence of the CSF containing spaces compatible with generalized cerebral atrophy. 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 No associated FLAIR or susceptibility artifact. 9 mm focus of diffusion abnormality seen involving the subcortical aspect of the left frontal centrum semi ovale (series 2, image 39). Associated FLAIR signal abnormality without definite ADC correlate. No associated enhancement. Finding favored to reflect an  evolving subacute white matter infarct. No associated hemorrhage or mass effect. No other diffusion abnormality to suggest acute or subacute ischemia. Gray-white matter differentiation otherwise maintained. No other areas of encephalomalacia to suggest chronic cortical infarction. No foci of susceptibility artifact to suggest acute or chronic intracranial hemorrhage. No mass lesion, midline shift or mass effect. Mild ventricular prominence related global parenchymal volume loss without hydrocephalus. No extra-axial fluid collection. Pituitary gland and suprasellar region within normal limits. Midline structures intact and within normal limits. No abnormal enhancement or evidence for intracranial metastatic disease. Vascular: Major intracranial vascular flow voids are maintained. Skull and upper cervical spine: Craniocervical junction within normal limits. Bone marrow signal intensity within normal limits. Probable small benign hemangioma noted within the left lateral mass of C1. No other focal marrow replacing lesions. No scalp soft tissue abnormality. Sinuses/Orbits: Patient status post bilateral ocular lens replacement. Globes and orbital soft tissues demonstrate no acute finding. Mild scattered mucosal thickening noted within the ethmoidal air cells. Paranasal sinuses are otherwise largely clear. Small bilateral mastoid effusions. Visualized nasopharynx is unremarkable. Inner ear structures grossly normal. Other: None. IMPRESSION: 1. 9 mm focus of diffusion abnormality involving the subcortical aspect of the left frontal centrum semi ovale, favored to reflect an evolving subacute white matter infarct. No associated hemorrhage or mass effect. 2. No other acute intracranial abnormality. No evidence for intracranial metastatic disease. 3. 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  Electronically Signed   By: BJeannine BogaM.D.   On: 09/23/2020 01:35   MR CERVICAL SPINE W WO CONTRAST  Result Date: 09/23/2020 CLINICAL DATA:  Initial evaluation for myelopathy. History of colorectal cancer. EXAM: MRI CERVICAL SPINE WITHOUT AND WITH CONTRAST TECHNIQUE: Multiplanar and multiecho pulse sequences of the cervical spine, to include the craniocervical junction and cervicothoracic junction, were obtained without and with intravenous contrast. CONTRAST:  7.559mGADAVIST GADOBUTROL 1 MMOL/ML IV SOLN COMPARISON:  None available. FINDINGS: Alignment: Examination moderately degraded by motion artifact. Straightening of the normal cervical lordosis. No significant listhesis. Vertebrae: Vertebral body height maintained without acute or chronic fracture. Bone marrow signal intensity somewhat diffusely decreased on T1 weighted imaging, nonspecific, but most commonly related to anemia, smoking, or obesity. Single  8 mm T1/T2 hyperintense lesion partially visualized within the left lateral mass of C1, nonspecific, but favored to reflect an atypical hemangioma. No other discrete or worrisome osseous lesions. No other abnormal marrow edema or enhancement. Cord: Signal intensity within the cervical spinal cord is grossly within normal limits on this motion degraded exam. No convincing cord signal change or abnormal enhancement. Posterior Fossa, vertebral arteries, paraspinal tissues: Craniocervical junction within normal limits. Diffuse edema noted within the posterior paraspinous soft tissues of the partially visualized upper back, mild specific, but could be related to overall volume status. No visible loculated collections. Prevertebral soft tissues within normal limits. Normal flow voids seen within the vertebral arteries bilaterally. Disc levels: C2-C3: Left eccentric disc bulge with associated left-sided uncinate spurring. Mild bilateral facet hypertrophy. No significant spinal stenosis. Moderate to  severe left C3 foraminal stenosis. Right neural foramina remains patent. C3-C4: Disc bulge with bilateral uncovertebral hypertrophy. Left-sided facet degeneration. Mild spinal stenosis. Severe bilateral C4 foraminal narrowing. C4-C5: Disc bulge with uncovertebral hypertrophy. Secondary flattening and partial effacement of the ventral thecal sac, slightly asymmetric to the right. Superimposed facet degeneration. Resultant moderate spinal stenosis. Severe left worse than right C5 foraminal narrowing. C5-C6: Degenerative intervertebral disc space narrowing with diffuse disc bulge, with uncovertebral and endplate spurring. Posterior disc osteophyte flattens and effaces the ventral thecal sac. Moderate spinal stenosis with mild cord flattening, but no definite cord signal changes. Superimposed mild facet hypertrophy. Severe right worse than left C6 foraminal narrowing. C6-C7: Diffuse disc bulge with uncovertebral and endplate spurring, asymmetric to the right. Flattening and effacement of the ventral thecal sac. Resultant moderate to severe spinal stenosis. Thecal sac measures 7 mm in AP diameter. Mild cord flattening without definite cord signal changes. Severe right worse than left C7 foraminal narrowing. C7-T1: Grossly negative interspace. No visible spinal stenosis. Foramina appear patent. IMPRESSION: 1. Technically limited exam due to extensive motion artifact. 2. Normal MRI appearance of the cervical spinal cord. No cord signal changes to suggest myelopathy. No evidence for metastatic disease within the cervical spine. Multilevel cervical spondylosis with resultant diffuse spinal stenosis at C3-4 through C6-7, moderate to severe in nature at the C6-7 level. Associated severe bilateral C4 through C7 foraminal stenosis. 3. Diffuse edema within the posterior paraspinous soft tissues of the partially visualized upper back, nonspecific, but could be related to overall volume status. Electronically Signed   By: Jeannine Boga M.D.   On: 09/23/2020 01:53   MR THORACIC SPINE W WO CONTRAST  Result Date: 09/23/2020 CLINICAL DATA:  Initial evaluation for myelopathy. History of colorectal cancer. EXAM: MRI THORACIC WITHOUT AND WITH CONTRAST TECHNIQUE: Multiplanar and multiecho pulse sequences of the thoracic spine were obtained without and with intravenous contrast. CONTRAST:  7.35m GADAVIST GADOBUTROL 1 MMOL/ML IV SOLN COMPARISON:  None available. FINDINGS: Alignment: Mild dextroscoliosis with exaggeration of the normal thoracic kyphosis. No listhesis. Vertebrae: Mild chronic height loss with associated Schmorl's node deformity noted at the superior endplate of L1. Vertebral body height otherwise maintained without acute fracture. Underlying bone marrow signal intensity mildly decreased on T1 weighted imaging, nonspecific, but most commonly related to anemia, smoking, or obesity. Few scattered T1/T2 hyperintense lesions noted, most characteristic of a small benign hemangiomata. Largest of these measures 7 mm at the T6 vertebral body. No other discrete or worrisome osseous lesions. No abnormal marrow edema or enhancement. Cord: Signal intensity within the thoracic spinal cord is grossly within normal limits on this technically limited exam. No convincing cord signal abnormality or abnormal  enhancement. Paraspinal and other soft tissues: Mild diffuse edema within the posterior paraspinous soft tissues of the upper back. Large layering bilateral pleural effusions. Paraspinous soft tissues demonstrate no other acute finding. Disc levels: No significant disc pathology seen within the thoracic spine for age. No significant disc bulge or focal disc herniation. Mild for age lower thoracic facet hypertrophy. No significant spinal stenosis. Foramina appear patent. No impingement. IMPRESSION: 1. Technically limited exam due to motion artifact. 2. Grossly normal MRI appearance of the thoracic spinal cord. No cord signal changes to suggest  myelopathy. No evidence for metastatic disease. 3. No significant disc pathology or stenosis. No evidence for neural impingement. 4. Large layering bilateral pleural effusions. 5. Mild diffuse edema within the posterior paraspinous soft tissues of the upper back, nonspecific, but could be related to overall volume status. Electronically Signed   By: Jeannine Boga M.D.   On: 09/23/2020 02:12    Assessment: 80 y.o. male with history of DM, HTN, recently diagnosed at least high grade dysplasia of rectal mass highly icious for colorectal cancer with known colovesical fistula with indwelling foley, currently admitted for bilateral pulmonary embolism, ARF with hypoxia. He continues heparin for anticoagulation. Recommend consulting pulmonology for evaluation of respiratory status and input regarding timing of switching patient to oral anticoagulation.   Invasive component was not identified in biopsy of rectal mass however, radiographically and clinically consistent with colorectal cancer. Suspect patient experienced increased hypercoaguable state due to likely malignancy, progressive weakness, recent hospitalizations, and immobility. Would recommend anticoagulation for at least 6 month to 1 year.   Currently, patient is felt to not be a candidate for systemic treatments or surgery. Agree with recommendation for outpatient palliative care. Should his performance status improve, could re-evaluate.   Plan: will continue to follow through hospitalization at request of patient and his daughter.    Verlon Au, NP 09/24/2020  10:23 PM [facility] at [facility]

## 2020-09-24 NOTE — Progress Notes (Signed)
Made Dr. Kurtis Bushman aware patient is on heparin drip, urine is bloody. Patient also continues to have loose stools, currently nothing order to help slow them down. Will continue to monitor closely

## 2020-09-24 NOTE — Progress Notes (Signed)
Dr. Kurtis Bushman acknowledged text page. Currently no new orders. Will continue to monitor closely

## 2020-09-24 NOTE — Progress Notes (Signed)
No C PROGRESS NOTE    Keith Reynolds  WCH:[id number] DOB: 28-Apr-1940 DOA: 09/20/2020 PCP: Sofie Hartigan, MD    Brief Narrative:  Keith Reynolds is a 80 y.o. male with medical history significant for DM, HTN, recently diagnosed colorectal cancer, with known colovesical fistula with indwelling Foley, recently hospitalized from 5/19-5/25 for persistent diarrhea and severe hyponatremia, improved and discharged to SNF who was in his usual state of health until the morning of admission when he had an outpatient doctor's appointment however by the afternoon he was noted to be lethargic at the facility, febrile at 101.2 with O2 sats 80% on room air.  Requiring NRB for transport with EMS.  Patient has been having continued watery diarrhea and poor appetite but no vomiting.  He has suprapubic abdominal pain with mild to moderate intensity, nonradiating.  Has some cough and shortness of breath but no chest pain.  He has generalized weakness. ED course: On arrival, tachycardic at 113, tachypneic at 22 with O2 sat of 95 on nonrebreather.  Afebrile with BP 117/60  CTA chest with bilateral pulmonary emboli without evidence of right heart strain, moderate left pleural effusion among other findings CT abdomen and pelvis with large complex sigmoid colonic mass compatible with colon cancer without evidence of bowel obstruction, colovesical fistula, mild bilateral hydronephrosis, improved from prior study among other findings  6/12-daughter at bedside.  Today she reports patient had seizure-like activity 2 days prior to coming to the hospital which was witnessed by PT at his SNF.  No seizure activity has been noted since admission.  She reports he seems to be doing better. Pt somewhat sleepy during my exam. He thinks he is feeling better.  6/13 at bedside.  C/o scrotum swelling. MRI results reviewed with pt and daughter.  Palliative care was consulted per daughter's request. He is considering comfort  measures and hospice.   6/14-Per family and pt, they have decided against comfort care and they would like to go to SNF on discharge. [facility] palliative services at [facility] after discharge. This a.m. patient with hematuria.  I asked nursing to flush Foley to see if it clears up as it could be due to trauma versus being on heparin drip.  Daughter requesting hematology consult   Consultants:  Neurology  Procedures:   Antimicrobials:  Cefepime,  Metronidazole Vancomycin discontinued   Subjective: Little less short of breath but not at baseline, still on 4 L nasal cannula at rest.  No chest pain.  Objective: Vitals:   09/24/20 0700 09/24/20 1001 09/24/20 1112 09/24/20 1205  BP: (!) 146/78  115/66   Pulse: 86  89   Resp: 19  20   Temp:   97.8 F (36.6 C)   TempSrc:      SpO2: 92%  97% 94%  Weight:  96.3 kg    Height:        Intake/Output Summary (Last 24 hours) at 09/24/2020 1533 Last data filed at 09/24/2020 1410 Gross per 24 hour  Intake 720 ml  Output 3200 ml  Net -2480 ml   Filed Weights   09/20/20 1705 09/24/20 1001  Weight: 90.7 kg 96.3 kg    Examination: Calm, falls asleep during exam, pale Anteriorly with decreased breath sounds at bases, no wheezing Regular S1-S2 no gallops Soft benign positive bowel sounds No edema Mood and affect appropriate in current setting Awakens and answers questions appropriately  Data Reviewed: I have personally reviewed following labs and imaging studies  CBC: Recent  Labs  Lab 09/20/20 1710 09/21/20 0500 09/22/20 0203 09/23/20 0537 09/24/20 0449  WBC 17.1* 16.9* 11.5* 7.7 10.0  NEUTROABS 14.2*  --   --   --   --   HGB 9.6* 9.2* 8.4* 9.0* 9.7*  HCT 28.7* 28.1* 25.8* 27.4* 29.6*  MCV 82.9 82.9 83.5 84.0 82.2  PLT 210 209 202 207 017   Basic Metabolic Panel: Recent Labs  Lab 09/20/20 1710 09/21/20 0500 09/22/20 0203 09/23/20 1204 09/24/20 0449  NA 122* 124*  125* 124* 123* 126*  K 3.9 3.4* 3.7 3.9 4.0  CL  88* 89* 91* 90* 89*  CO2 27 28 30 30 31   GLUCOSE 172* 145* 180* 170* 174*  BUN 29* 22 25* 24* 21  CREATININE 0.47* 0.46* 0.47* 0.43* 0.48*  CALCIUM 7.6* 7.7* 7.4* 7.6* 7.7*   GFR: Estimated Creatinine Clearance: 81.5 mL/min (A) (by C-G formula based on SCr of 0.48 mg/dL (L)). Liver Function Tests: Recent Labs  Lab 09/20/20 1710  AST 15  ALT 9  ALKPHOS 96  BILITOT 1.4*  PROT 5.0*  ALBUMIN 2.1*   No results for input(s): LIPASE, AMYLASE in the last 168 hours. No results for input(s): AMMONIA in the last 168 hours. Coagulation Profile: Recent Labs  Lab 09/20/20 1710 09/21/20 0500  INR 1.5* 1.4*   Cardiac Enzymes: Recent Labs  Lab 09/22/20 1620  CKTOTAL 9*   BNP (last 3 results) No results for input(s): PROBNP in the last 8760 hours. HbA1C: No results for input(s): HGBA1C in the last 72 hours. CBG: No results for input(s): GLUCAP in the last 168 hours. Lipid Profile: Recent Labs    09/24/20 0449  CHOL 73  HDL 32*  LDLCALC 28  TRIG 65  CHOLHDL 2.3   Thyroid Function Tests: No results for input(s): TSH, T4TOTAL, FREET4, T3FREE, THYROIDAB in the last 72 hours. Anemia Panel: Recent Labs    09/22/20 1620  VITAMINB12 310   Sepsis Labs: Recent Labs  Lab 09/20/20 1711 09/20/20 1910 09/21/20 0500  PROCALCITON  --   --  0.35  LATICACIDVEN 1.0 1.0  --     Recent Results (from the past 240 hour(s))  Blood Culture (routine x 2)     Status: None (Preliminary result)   Collection Time: 09/20/20  5:11 PM   Specimen: BLOOD  Result Value Ref Range Status   Specimen Description BLOOD BLOOD RIGHT FOREARM  Final   Special Requests   Final    BOTTLES DRAWN AEROBIC AND ANAEROBIC Blood Culture results may not be optimal due to an inadequate volume of blood received in culture bottles   Culture   Final    NO GROWTH 4 DAYS Performed at [facility], [address] [place], [place] [address]    Report Status PENDING  Incomplete  Blood Culture (routine x  2)     Status: None (Preliminary result)   Collection Time: 09/20/20  5:11 PM   Specimen: BLOOD  Result Value Ref Range Status   Specimen Description BLOOD RIGHT ANTECUBITAL  Final   Special Requests   Final    BOTTLES DRAWN AEROBIC AND ANAEROBIC Blood Culture results may not be optimal due to an inadequate volume of blood received in culture bottles   Culture   Final    NO GROWTH 4 DAYS Performed at [facility], [address] [place], [place] [address]    Report Status PENDING  Incomplete  Resp Panel by RT-PCR (Flu A&B, Covid) Nasopharyngeal Swab  Status: None   Collection Time: 09/20/20  5:18 PM   Specimen: Nasopharyngeal Swab; Nasopharyngeal(NP) swabs in vial transport medium  Result Value Ref Range Status   SARS Coronavirus 2 by RT PCR NEGATIVE NEGATIVE Final    Comment: (NOTE) SARS-CoV-2 target nucleic acids are NOT DETECTED.  The SARS-CoV-2 RNA is generally detectable in upper respiratory specimens during the acute phase of infection. The lowest concentration of SARS-CoV-2 viral copies this assay can detect is 138 copies/mL. A negative result does not preclude SARS-Cov-2 infection and should not be used as the sole basis for treatment or other patient management decisions. A negative result may occur with  improper specimen collection/handling, submission of specimen other than nasopharyngeal swab, presence of viral mutation(s) within the areas targeted by this assay, and inadequate number of viral copies(<138 copies/mL). A negative result must be combined with clinical observations, patient history, and epidemiological information. The expected result is Negative.  Fact Sheet for Patients:  EntrepreneurPulse.com.au  Fact Sheet for Healthcare Providers:  IncredibleEmployment.be  This test is no t yet approved or cleared by the [place] FDA and  has been authorized for detection and/or diagnosis of SARS-CoV-2 by FDA  under an Emergency Use Authorization (EUA). This EUA will remain  in effect (meaning this test can be used) for the duration of the COVID-19 declaration under Section 564(b)(1) of the Act, 21 U.S.C.section 360bbb-3(b)(1), unless the authorization is terminated  or revoked sooner.       Influenza A by PCR NEGATIVE NEGATIVE Final   Influenza B by PCR NEGATIVE NEGATIVE Final    Comment: (NOTE) The Xpert Xpress SARS-CoV-2/FLU/RSV plus assay is intended as an aid in the diagnosis of influenza from Nasopharyngeal swab specimens and should not be used as a sole basis for treatment. Nasal washings and aspirates are unacceptable for Xpert Xpress SARS-CoV-2/FLU/RSV testing.  Fact Sheet for Patients: EntrepreneurPulse.com.au  Fact Sheet for Healthcare Providers: IncredibleEmployment.be  This test is not yet approved or cleared by the [place] FDA and has been authorized for detection and/or diagnosis of SARS-CoV-2 by FDA under an Emergency Use Authorization (EUA). This EUA will remain in effect (meaning this test can be used) for the duration of the COVID-19 declaration under Section 564(b)(1) of the Act, 21 U.S.C. section 360bbb-3(b)(1), unless the authorization is terminated or revoked.  Performed at [facility], [address] [place], [place] [address]   Urine culture     Status: Abnormal   Collection Time: 09/21/20  5:01 AM   Specimen: In/Out Cath Urine  Result Value Ref Range Status   Specimen Description   Final    IN/OUT CATH URINE Performed at [facility], [address] [place], [place] [address]    Special Requests   Final    NONE Performed at [facility], [address] [place], [place] [address]    Culture MULTIPLE SPECIES PRESENT, SUGGEST RECOLLECTION (A)  Final   Report Status 09/22/2020 FINAL  Final  C Difficile Quick Screen w PCR reflex     Status: None   Collection Time: 09/22/20  9:00  AM   Specimen: STOOL  Result Value Ref Range Status   C Diff antigen NEGATIVE NEGATIVE Final   C Diff toxin NEGATIVE NEGATIVE Final   C Diff interpretation No C. difficile detected.  Final    Comment: Performed at [facility], [address], [place], [place] [address]         Radiology Studies: MR BRAIN W [place]  Result Date: 09/23/2020 CLINICAL DATA:  Initial evaluation for metastatic disease evaluation. EXAM: MRI HEAD WITHOUT AND WITH CONTRAST TECHNIQUE: Multiplanar, multiecho pulse sequences of the brain and surrounding structures were obtained without and with intravenous contrast. CONTRAST:  7.29mL GADAVIST GADOBUTROL 1 MMOL/ML IV SOLN COMPARISON:  None available. FINDINGS: Brain: Diffuse prominence of the CSF containing spaces compatible with generalized cerebral atrophy. 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 No associated FLAIR or susceptibility artifact. 9 mm focus of diffusion abnormality seen involving the subcortical aspect of the left frontal centrum semi ovale (series 2, image 39). Associated FLAIR signal abnormality without definite ADC correlate. No associated enhancement. Finding favored to reflect an evolving subacute white matter infarct. No associated hemorrhage or mass effect. No other diffusion abnormality to suggest acute or subacute ischemia. Gray-white matter differentiation otherwise maintained. No other areas of encephalomalacia to suggest chronic cortical infarction. No foci of susceptibility artifact to suggest acute or chronic intracranial hemorrhage. No mass lesion, midline shift or mass effect. Mild ventricular prominence related global parenchymal volume loss without hydrocephalus. No extra-axial fluid collection. Pituitary gland and suprasellar region within normal limits. Midline  structures intact and within normal limits. No abnormal enhancement or evidence for intracranial metastatic disease. Vascular: Major intracranial vascular flow voids are maintained. Skull and upper cervical spine: Craniocervical junction within normal limits. Bone marrow signal intensity within normal limits. Probable small benign hemangioma noted within the left lateral mass of C1. No other focal marrow replacing lesions. No scalp soft tissue abnormality. Sinuses/Orbits: Patient status post bilateral ocular lens replacement. Globes and orbital soft tissues demonstrate no acute finding. Mild scattered mucosal thickening noted within the ethmoidal air cells. Paranasal sinuses are otherwise largely clear. Small bilateral mastoid effusions. Visualized nasopharynx is unremarkable. Inner ear structures grossly normal. Other: None. IMPRESSION: 1. 9 mm focus of diffusion abnormality involving the subcortical aspect of the left frontal centrum semi ovale, favored to reflect an evolving subacute white matter infarct. No associated hemorrhage or mass effect. 2. No other acute intracranial abnormality. No evidence for intracranial metastatic disease. 3. 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 Electronically Signed   By: Jeannine Boga M.D.   On: 09/23/2020 01:35   MR CERVICAL SPINE W WO CONTRAST  Result Date: 09/23/2020 CLINICAL DATA:  Initial evaluation for myelopathy. History of colorectal cancer. EXAM: MRI CERVICAL SPINE WITHOUT AND WITH CONTRAST TECHNIQUE: Multiplanar and multiecho pulse sequences of the cervical spine, to include the craniocervical junction and cervicothoracic junction, were obtained without and with intravenous contrast. CONTRAST:  7.36mL GADAVIST GADOBUTROL 1 MMOL/ML IV SOLN COMPARISON:  None available. FINDINGS: Alignment: Examination moderately degraded by  motion artifact. Straightening of the normal cervical lordosis. No significant listhesis. Vertebrae: Vertebral body height maintained without acute or chronic fracture. Bone marrow signal intensity somewhat diffusely decreased on T1 weighted imaging, nonspecific, but most commonly related to anemia, smoking, or obesity. Single 8 mm T1/T2 hyperintense lesion partially visualized within the left lateral mass of C1, nonspecific, but favored to reflect an atypical hemangioma. No other discrete or worrisome osseous lesions. No other abnormal marrow edema or enhancement. Cord: Signal intensity within the cervical spinal cord is grossly within normal limits on this motion degraded exam. No convincing cord signal change or abnormal enhancement. Posterior Fossa, vertebral arteries, paraspinal tissues:  Craniocervical junction within normal limits. Diffuse edema noted within the posterior paraspinous soft tissues of the partially visualized upper back, mild specific, but could be related to overall volume status. No visible loculated collections. Prevertebral soft tissues within normal limits. Normal flow voids seen within the vertebral arteries bilaterally. Disc levels: C2-C3: Left eccentric disc bulge with associated left-sided uncinate spurring. Mild bilateral facet hypertrophy. No significant spinal stenosis. Moderate to severe left C3 foraminal stenosis. Right neural foramina remains patent. C3-C4: Disc bulge with bilateral uncovertebral hypertrophy. Left-sided facet degeneration. Mild spinal stenosis. Severe bilateral C4 foraminal narrowing. C4-C5: Disc bulge with uncovertebral hypertrophy. Secondary flattening and partial effacement of the ventral thecal sac, slightly asymmetric to the right. Superimposed facet degeneration. Resultant moderate spinal stenosis. Severe left worse than right C5 foraminal narrowing. C5-C6: Degenerative intervertebral disc space narrowing with diffuse disc bulge, with uncovertebral and  endplate spurring. Posterior disc osteophyte flattens and effaces the ventral thecal sac. Moderate spinal stenosis with mild cord flattening, but no definite cord signal changes. Superimposed mild facet hypertrophy. Severe right worse than left C6 foraminal narrowing. C6-C7: Diffuse disc bulge with uncovertebral and endplate spurring, asymmetric to the right. Flattening and effacement of the ventral thecal sac. Resultant moderate to severe spinal stenosis. Thecal sac measures 7 mm in AP diameter. Mild cord flattening without definite cord signal changes. Severe right worse than left C7 foraminal narrowing. C7-T1: Grossly negative interspace. No visible spinal stenosis. Foramina appear patent. IMPRESSION: 1. Technically limited exam due to extensive motion artifact. 2. Normal MRI appearance of the cervical spinal cord. No cord signal changes to suggest myelopathy. No evidence for metastatic disease within the cervical spine. Multilevel cervical spondylosis with resultant diffuse spinal stenosis at C3-4 through C6-7, moderate to severe in nature at the C6-7 level. Associated severe bilateral C4 through C7 foraminal stenosis. 3. Diffuse edema within the posterior paraspinous soft tissues of the partially visualized upper back, nonspecific, but could be related to overall volume status. Electronically Signed   By: Jeannine Boga M.D.   On: 09/23/2020 01:53   MR THORACIC SPINE W WO CONTRAST  Result Date: 09/23/2020 CLINICAL DATA:  Initial evaluation for myelopathy. History of colorectal cancer. EXAM: MRI THORACIC WITHOUT AND WITH CONTRAST TECHNIQUE: Multiplanar and multiecho pulse sequences of the thoracic spine were obtained without and with intravenous contrast. CONTRAST:  7.99mL GADAVIST GADOBUTROL 1 MMOL/ML IV SOLN COMPARISON:  None available. FINDINGS: Alignment: Mild dextroscoliosis with exaggeration of the normal thoracic kyphosis. No listhesis. Vertebrae: Mild chronic height loss with associated  Schmorl's node deformity noted at the superior endplate of L1. Vertebral body height otherwise maintained without acute fracture. Underlying bone marrow signal intensity mildly decreased on T1 weighted imaging, nonspecific, but most commonly related to anemia, smoking, or obesity. Few scattered T1/T2 hyperintense lesions noted, most characteristic of a small benign hemangiomata. Largest of these measures 7 mm at the T6 vertebral body. No other discrete or worrisome osseous lesions. No abnormal marrow edema or enhancement. Cord: Signal intensity within the thoracic spinal cord is grossly within normal limits on this technically limited exam. No convincing cord signal abnormality or abnormal enhancement. Paraspinal and other soft tissues: Mild diffuse edema within the posterior paraspinous soft tissues of the upper back. Large layering bilateral pleural effusions. Paraspinous soft tissues demonstrate no other acute finding. Disc levels: No significant disc pathology seen within the thoracic spine for age. No significant disc bulge or focal disc herniation. Mild for age lower thoracic facet hypertrophy. No significant spinal stenosis. Foramina appear patent. No  impingement. IMPRESSION: 1. Technically limited exam due to motion artifact. 2. Grossly normal MRI appearance of the thoracic spinal cord. No cord signal changes to suggest myelopathy. No evidence for metastatic disease. 3. No significant disc pathology or stenosis. No evidence for neural impingement. 4. Large layering bilateral pleural effusions. 5. Mild diffuse edema within the posterior paraspinous soft tissues of the upper back, nonspecific, but could be related to overall volume status. Electronically Signed   By: Jeannine Boga M.D.   On: 09/23/2020 02:12        Scheduled Meds:  Chlorhexidine Gluconate Cloth  6 each Topical Daily   mouth rinse  15 mL Mouth Rinse BID   vitamin B-12  1,000 mcg Oral Daily   Continuous Infusions:  ceFEPime  (MAXIPIME) IV 2 g (09/24/20 1420)   heparin 2,700 Units/hr (09/24/20 0945)   metronidazole 500 mg (09/24/20 1453)    Assessment & Plan:   Principal Problem:   Bilateral pulmonary embolism (HCC) Active Problems:   Diabetes mellitus, type II ([place])   Hypertension   Hyponatremia   Diarrhea   Colorectal cancer ([place])   Colovesical fistula   Indwelling Foley catheter present   Acute respiratory failure with hypoxia ([place])   Sepsis ([place])   80 year old male with history of DM, HTN, recently diagnosed colorectal cancer, with known colovesical fistula with indwelling Foley, recently hospitalized from 5/19-5/25 for persistent diarrhea and severe hyponatremia, improved and discharged to SNF sent in for evaluation of lethargy, fever and sats 80% on room air requiring NRB for transportation.       Bilateral pulmonary embolism (HCC)   Acute respiratory failure with hypoxia ([place]) -Patient presents with shortness of breath, tachypnea with O2 sat 80% requiring NRB to maintain sats in the mid 90s -CTA chest with bilateral pulmonary emboli without evidence of right heart strain, moderate left pleural effusion among other findings Patient declined thrombectomy on admission 6/13- on 4L [place], will try to wean as tolerated.  Will give lasix 20mg  iv x1  Continue oxygen supplementation to keep O2 sat above 92%  Once oxygenation improves will switch to Eliquis  Can follow-up with hematology as outpatient if not considering hospice or comfort measures  Will hold off on thoracentesis for now as patient is considering comfort measures   Hyponatremia - Suspect hypovolemic in the setting of ongoing diarrhea, likely related to colon cancer 6/14-lead due to hypovolemia.  Received IV fluids.  Then sodium started dropping. Was given Lasix on 6/13 with improvement of his sodium to 126 today. May need as needed Lasix and fluid restriction. Monitor levels       Sepsis ([place])   Indwelling Foley catheter present -  Patient with fever, tachycardia, hypoxia, lethargy, leukocytosis though with normal lactic acid 6/12 UA positive  Urine culture with multiple species  6/14 leukocytosis improved Procalcitonin was 0.35 No definite infiltrate on chest x-ray Empirically treated for IV cefepime and Flagyl continue to finish 5-day course Follow-up blood cultures to date negative   ?Seizure Progressive vision loss Bilateral LE weakness- reported by daughter that it witnessed by PT at SNF prior to admission.  Could be due to PE/hypoxia/hyponatremia. No hx/o seizure per daughter 6/13- neurology was consulted, input was appreciated.  Had MRI showing subacute infarct <89mm mid left frontal white matter. Etiology likely small vessel disease given her risk factor and location No indication for antiplatelets agent given patient being on therapeutic anticoagulation, if anticoagulation is stopped recommend aspirin 81 mg daily Goal LDL less than 70 Will cancel  carotid ultrasound discountinued since was thinking of comfort care. 6/14 will reorder carotid ultrasound to complete evaluation since want full scope of care Low to normal B12, on B12 1000 MCG daily  Needs to follow-up with ophthalmology for his eye issues as outpatient basis         Diabetes mellitus, type II ([place]) BG stable, continue R-ISS     Hypertension Mostly normotensive Continue to monitor   Hematuria - noted this am by nsg.  On heparin gtt, unsure due to a/c or foley trauma Asked nursing to flush and monitor.  H/h stable this am.      Diarrhea   Colorectal cancer ([place])   Colovesical fistula Foley care Skin care 6/13C diff negative   Generalized weakness/physical deconditioning - Nutritionist evaluation and PT eval     DVT prophylaxis: IV heparin Code Status: DNR Family Communication: Daughter at bedside Disposition Plan:  Status is: Inpatient  Remains inpatient appropriate because:Inpatient level of care appropriate due to  severity of illness  Dispo: The patient is from: Home              Anticipated d/c is to: TBD              Patient currently is not medically stable to d/c.   Difficult to place patient No   Family wants to go to [facility] on discharge. On heparin gtt, hematology consulted, with hematuria. Still not medically stable.        LOS: 4 days   Time spent: 35 minutes with more than 50% on [place], MD [facility] Pager [phone]  If 7PM-7AM, please contact night-coverage 09/24/2020, 3:33 PM

## 2020-09-24 NOTE — Progress Notes (Signed)
[facility]) RN Hospital Liaison note:  Notified by Quinn Axe, NP patient/family request for [facility] palliative services at [facility] after discharge.   [facility] hospital liaison will follow patient for discharge disposition.   Please call with any hospice or outpatient palliative care related questions.   Thank you for the opportunity to participate in this patient's care.  Bobbie "Loren Racer, RN, BSN [facility] Liaison [phone]

## 2020-09-24 NOTE — Consult Note (Signed)
ANTICOAGULATION CONSULT NOTE   Pharmacy Consult for heparin infusion Indication: pulmonary embolus  Allergies  Allergen Reactions   Antihistamines, Diphenhydramine-Type    Bactrim [Sulfamethoxazole-Trimethoprim] Nausea Only   Chamomile Other (See Comments)   Diphenhydramine Hcl Other (See Comments)    dizziness   Gabapentin    Levomenol    Pseudoephedrine Hcl Other (See Comments)    dizziness   Rosuvastatin    Triprolidine Hcl Other (See Comments)    dizziness    Patient Measurements: Height: 5\' 7"  (170.2 cm) Weight: 96.3 kg (212 lb 4.9 oz) IBW/kg (Calculated) : 66.1 Heparin Dosing Weight: 85 kg   Vital Signs: Temp: 97.5 F (36.4 C) (06/14 1732) BP: 137/64 (06/14 1732) Pulse Rate: 89 (06/14 1732)  Labs: Recent Labs    09/22/20 0203 09/22/20 1950 09/22/20 1620 09/23/20 0101 09/23/20 0537 09/23/20 1204 09/23/20 2255 09/24/20 0449 09/24/20 0949 09/24/20 1754  HGB 8.4*  --   --   --  9.0*  --   --  9.7*  --   --   HCT 25.8*  --   --   --  27.4*  --   --  29.6*  --   --   PLT 202  --   --   --  207  --   --  227  --   --   HEPARINUNFRC <0.10*   < > 0.67   < >  --  0.13* 0.25*  --  0.36 0.38  CREATININE 0.47*  --   --   --   --  0.43*  --  0.48*  --   --   CKTOTAL  --   --  9*  --   --   --   --   --   --   --    < > = values in this interval not displayed.     Estimated Creatinine Clearance: 81.5 mL/min (A) (by C-G formula based on SCr of 0.48 mg/dL (L)).   Medical History: Past Medical History:  Diagnosis Date   Acute urinary retention 09/04/2020   Chronic venous stasis 05/28/2017   Colon cancer ([place])    reported by patient and wife   Diarrhea 09/04/2020   Fracture    right forearm, tibia/fibula   Gait instability 05/28/2017   Heart murmur, systolic 9/32/6712   Hernia, abdominal    History of diabetes mellitus 05/28/2017   Hypertension    Hypokalemia 08/29/2020   Hyponatremia 09/04/2020   Malnutrition of moderate degree 08/30/2020   Obesity (BMI  30-39.9) 05/28/2017   Pressure injury of skin 08/30/2020    Medications:  No prior anticoagulation noted Heparin Dosing Weight: 85 kg   Assessment: 80 y.o. male presents from a long-term care facility via EMS complaining of shortness of breathe. Found to have bilateral pulmonary emboli on CT. Pharmacy has been consulted for initiation and management of heparin infusion for PE. Per MD, patient has some hematuria reported 6/14. Onc-heme to see patient.   6/13 0139 HL  < 0.1, subtherapeutic (Per RN, pt to MRI 1045 pm - 1230 & switched to MRI tubing so potential interruption in heparin drip. ) 6/13 1204 HL = 0.13; subtherapeutic 6/13 2255 HL = 0.25, subtherapeutic 6/14 0949 HL = 0.36, therapeutic x 1 @ 2700 units/hr 6/14 1754 HL = 0.38, therapeutic x 2  Goal of Therapy:  Heparin level 0.3-0.7 units/ml Monitor platelets by anticoagulation protocol: Yes   Plan:  Heparin level remains therapeutic. Will continue heparin infusion at 2700  units/hr. Recheck heparin with AM labs. CBC daily while on heparin. Follow up with transition to PO anticoagulation.   Dorothe Pea, PharmD, BCPS Clinical Pharmacist   09/24/2020 6:40 PM

## 2020-09-24 NOTE — NC FL2 (Signed)
[place] LEVEL OF CARE SCREENING TOOL     IDENTIFICATION  Patient Name: Keith Reynolds Birthdate: November 20, 1940 Sex: male Admission Date (Current Location): 09/20/2020  [facility] and [place] Number:  Engineering geologist and Address:  [facility], [address], [place], [place] [address]      Provider Number: [id number]  Attending Physician Name and Address:  Nolberto Hanlon, MD  Relative Name and Phone Number:       Current Level of Care: Hospital Recommended Level of Care: [place] Prior Approval Number:    Date Approved/Denied:   PASRR Number:    Discharge Plan: SNF    Current Diagnoses: Patient Active Problem List   Diagnosis Date Noted   Colovesical fistula 09/20/2020   Indwelling Foley catheter present 09/20/2020   Bilateral pulmonary embolism ([place]) 09/20/2020   Acute respiratory failure with hypoxia ([place]) 09/20/2020   Sepsis ([place]) 09/20/2020   Hyponatremia 09/04/2020   Diarrhea 09/04/2020   Decreased oral intake 09/04/2020   Acute urinary retention 09/04/2020   Colorectal cancer ([place]) 09/04/2020   Generalized muscle weakness 09/04/2020   Pressure injury of skin 08/30/2020   Malnutrition of moderate degree 08/30/2020   Hypokalemia 08/29/2020   Prediabetes 06/03/2017   Hypertension 05/28/2017   Heart murmur, systolic 70/96/2836   History of diabetes mellitus 05/28/2017   Obesity (BMI 30-39.9) 05/28/2017   Gait instability 05/28/2017   Chronic venous stasis 05/28/2017   Diabetes mellitus, type II ([place]) 09/29/2012    Orientation RESPIRATION BLADDER Height & Weight     Self, Time, Situation, Place  O2 ([place] 4L) Incontinent Weight: 199 lb 15.3 oz (90.7 kg) Height:  5\' 7"  (170.2 cm)  BEHAVIORAL SYMPTOMS/MOOD NEUROLOGICAL BOWEL NUTRITION STATUS      Incontinent Diet (heart healthy, thin liquids)  AMBULATORY STATUS COMMUNICATION OF NEEDS Skin   Extensive Assist Verbally PU Stage and Appropriate Care,  Skin abrasions (right heel--unstageable)     PU Stage 3 Dressing:  (Sacrum, buttocks)                 Personal Care Assistance Level of Assistance  Dressing, Feeding, Bathing Bathing Assistance: Maximum assistance Feeding assistance: Independent Dressing Assistance: Maximum assistance     Functional Limitations Info  Sight, Speech, Hearing Sight Info: Adequate Hearing Info: Adequate Speech Info: Adequate    SPECIAL CARE FACTORS FREQUENCY  PT (By licensed PT), OT (By licensed OT)     PT Frequency: 5x OT Frequency: 5x            Contractures Contractures Info: Not present    Additional Factors Info  Code Status, Allergies Code Status Info: DNR Allergies Info: Antihistamines, Diphenhydramine-type, Bactrim (Sulfamethoxazole-trimethoprim), Chamomile, Diphenhydramine Hcl, Gabapentin, Levomenol, Pseudoephedrine Hcl, Rosuvastatin, Triprolidine Hcl           Current Medications (09/24/2020):  This is the current hospital active medication list Current Facility-Administered Medications  Medication Dose Route Frequency Provider Last Rate Last Admin   acetaminophen (TYLENOL) tablet 650 mg  650 mg Oral Q6H PRN Athena Masse, MD       Or   acetaminophen (TYLENOL) suppository 650 mg  650 mg Rectal Q6H PRN Athena Masse, MD       ceFEPIme (MAXIPIME) 2 g in sodium chloride 0.9 % 100 mL IVPB  2 g Intravenous Q8H Judd Gaudier V, MD 200 mL/hr at 09/24/20 0414 2 g at 09/24/20 0414   Chlorhexidine Gluconate Cloth 2 % PADS 6 each  6 each Topical Daily Damita Dunnings,  Waldemar Dickens, MD   6 each at 09/23/20 1312   heparin ADULT infusion 100 units/mL (25000 units/218mL)  2,700 Units/hr Intravenous Continuous Renda Rolls, RPH 27 mL/hr at 09/24/20 0054 2,700 Units/hr at 09/24/20 0054   HYDROcodone-acetaminophen (NORCO/VICODIN) 5-325 MG per tablet 1-2 tablet  1-2 tablet Oral Q4H PRN Athena Masse, MD   2 tablet at 09/22/20 2130   MEDLINE mouth rinse  15 mL Mouth Rinse BID Judd Gaudier V, MD   15  mL at 09/23/20 2103   metroNIDAZOLE (FLAGYL) IVPB 500 mg  500 mg Intravenous Q8H Judd Gaudier V, MD 100 mL/hr at 09/24/20 0457 500 mg at 09/24/20 0457   ondansetron (ZOFRAN) tablet 4 mg  4 mg Oral Q6H PRN Athena Masse, MD       Or   ondansetron [facility]) injection 4 mg  4 mg Intravenous Q6H PRN Athena Masse, MD       vitamin B-12 (CYANOCOBALAMIN) tablet 1,000 mcg  1,000 mcg Oral Daily Bhagat, Srishti L, MD   1,000 mcg at 09/23/20 8756     Discharge Medications: Please see discharge summary for a list of discharge medications.  Relevant Imaging Results:  Relevant Lab Results:   Additional Information EPP:[id number]  Gerrianne Scale Charnese Federici, LCSW

## 2020-09-24 NOTE — Consult Note (Signed)
ANTICOAGULATION CONSULT NOTE   Pharmacy Consult for heparin infusion Indication: pulmonary embolus  Allergies  Allergen Reactions   Antihistamines, Diphenhydramine-Type    Bactrim [Sulfamethoxazole-Trimethoprim] Nausea Only   Chamomile Other (See Comments)   Diphenhydramine Hcl Other (See Comments)    dizziness   Gabapentin    Levomenol    Pseudoephedrine Hcl Other (See Comments)    dizziness   Rosuvastatin    Triprolidine Hcl Other (See Comments)    dizziness    Patient Measurements: Height: 5\' 7"  (170.2 cm) Weight: 90.7 kg (199 lb 15.3 oz) IBW/kg (Calculated) : 66.1 Heparin Dosing Weight: 85 kg   Vital Signs: Temp: 98.6 F (37 C) (06/13 1956) Temp Source: Oral (06/13 1525) BP: 99/78 (06/13 1956) Pulse Rate: 100 (06/13 1956)  Labs: Recent Labs    09/21/20 0500 09/21/20 1613 09/22/20 0203 09/22/20 9485 09/22/20 1620 09/23/20 0101 09/23/20 0537 09/23/20 1204 09/23/20 2255  HGB 9.2*  --  8.4*  --   --   --  9.0*  --   --   HCT 28.1*  --  25.8*  --   --   --  27.4*  --   --   PLT 209  --  202  --   --   --  207  --   --   LABPROT 16.7*  --   --   --   --   --   --   --   --   INR 1.4*  --   --   --   --   --   --   --   --   HEPARINUNFRC <0.10*   < > <0.10*   < > 0.67 <0.10*  --  0.13* 0.25*  CREATININE 0.46*  --  0.47*  --   --   --   --  0.43*  --   CKTOTAL  --   --   --   --  9*  --   --   --   --    < > = values in this interval not displayed.     Estimated Creatinine Clearance: 79.1 mL/min (A) (by C-G formula based on SCr of 0.43 mg/dL (L)).   Medical History: Past Medical History:  Diagnosis Date   Acute urinary retention 09/04/2020   Chronic venous stasis 05/28/2017   Colon cancer ([place])    reported by patient and wife   Diarrhea 09/04/2020   Fracture    right forearm, tibia/fibula   Gait instability 05/28/2017   Heart murmur, systolic 4/62/7035   Hernia, abdominal    History of diabetes mellitus 05/28/2017   Hypertension    Hypokalemia  08/29/2020   Hyponatremia 09/04/2020   Malnutrition of moderate degree 08/30/2020   Obesity (BMI 30-39.9) 05/28/2017   Pressure injury of skin 08/30/2020    Medications:  No prior anticoagulation noted Heparin Dosing Weight: 85 kg   Assessment: 80 y.o. male presents from a long-term care facility via EMS complaining of of shortness. Found to have bilateral pulmonary emboli on CT. Pharmacy has been consulted for initiation and management of heparin infusion for PE.  Baseline aPTT 36, INR 1.5   6/11 0500 HL <0.10, rate increased to 1650 units/hr 6/11 1613 HL 0.69, therapeutic x 1 6/12 0203 HL  < 0.1, subtherapeutic 6/12 0613 HL  < 0.1, subtherapeutic, rate increased to 1950 units/hr 6/12 1620 HL 0.67 therapeutic x1 6/13 0139 HL  < 0.1, subtherapeutic (Per RN, pt to MRI 1045 pm -  1230 & switched to MRI tubing so potential interruption in heparin drip. ) 6/13 1204 HL = 0.13; subtherapeutic 6/13 2255 HL = 0.25, subtherapeutic  Goal of Therapy:  Heparin level 0.3-0.7 units/ml Monitor platelets by anticoagulation protocol: Yes   Plan:  Will order Heparin 1300 units IV X 1 bolus and increase drip rate to 2700 units/hr.  Will recheck HL 8 hrs after rate change.   Renda Rolls, PharmD, [facility] 09/24/2020 12:44 AM

## 2020-09-24 NOTE — TOC Initial Note (Signed)
Transition of Care [facility]) - Initial/Assessment Note    Patient Details  Name: Keith Reynolds MRN: [id number] Date of Birth: Jun 04, 1940  Transition of Care [facility]) CM/SW Contact:    Eileen Stanford, LCSW Phone Number: 09/24/2020, 3:34 PM  Clinical Narrative:    CSW spoke with pt, pt's spouse, and pt's daughter at bedside--confirmed pt came from [facility]. Pt's family is requesting that pt return to [facility] under LTC. Pt does not have Medicaid therefore pt has agreed to private pay. CSW has reached out to [facility] and they will discuss with family.               Expected Discharge Plan: Skilled Nursing Facility Barriers to Discharge: Continued Medical Work up   Patient Goals and CMS Choice Patient states their goals for this hospitalization and ongoing recovery are:: to go back to rehab for LTC   Choice offered to / list presented to : Patient, Adult Children, Spouse  Expected Discharge Plan and Services Expected Discharge Plan: [place] In-house Referral: NA Discharge Planning Services: NA Post Acute Care Choice: [place] Living arrangements for the past 2 months: [place]                                      Prior Living Arrangements/Services Living arrangements for the past 2 months: [place] Lives with:: Facility Resident Patient language and need for interpreter reviewed:: Yes Do you feel safe going back to the place where you live?: Yes      Need for Family Participation in Patient Care: Yes (Comment) Care giver support system in place?: Yes (comment)   Criminal Activity/Legal Involvement Pertinent to Current Situation/Hospitalization: No - Comment as needed  Activities of Daily Living Home Assistive Devices/Equipment: Grab bars in shower, Wheelchair, Environmental consultant (specify type) ADL Screening (condition at time of admission) Patient's cognitive ability adequate to safely complete daily activities?: Yes Is the  patient deaf or have difficulty hearing?: Yes Does the patient have difficulty seeing, even when wearing glasses/contacts?: Yes Does the patient have difficulty concentrating, remembering, or making decisions?: No Patient able to express need for assistance with ADLs?: Yes Does the patient have difficulty dressing or bathing?: Yes Independently performs ADLs?: No Communication: Independent Dressing (OT): Needs assistance Is this a change from baseline?: Pre-admission baseline Grooming: Needs assistance Is this a change from baseline?: Pre-admission baseline Feeding: Needs assistance Is this a change from baseline?: Pre-admission baseline Bathing: Needs assistance Is this a change from baseline?: Pre-admission baseline Toileting: Needs assistance Is this a change from baseline?: Pre-admission baseline In/Out Bed: Needs assistance Is this a change from baseline?: Pre-admission baseline Walks in Home: Needs assistance Is this a change from baseline?: Pre-admission baseline Does the patient have difficulty walking or climbing stairs?: Yes Weakness of Legs: Both Weakness of Arms/Hands: Both  Permission Sought/Granted Permission sought to share information with : Family Supports    Share Information with NAME: Oceanographer granted to share info w AGENCY: [facility]  Permission granted to share info w Relationship: spouse     Emotional Assessment Appearance:: Appears stated age Attitude/Demeanor/Rapport: Engaged Affect (typically observed): Accepting, Appropriate Orientation: : Oriented to Self, Oriented to Place, Oriented to  Time, Oriented to Situation Alcohol / Substance Use: Not Applicable Psych Involvement: No (comment)  Admission diagnosis:  Swelling [R60.9] Hyponatremia [E87.1] Colovesical fistula [N32.1] Bilateral pulmonary embolism (HCC) [I26.99] Acute respiratory failure  with hypoxia ([place]) [J96.01] HCAP (healthcare-associated pneumonia) [J18.9] Patient Active  Problem List   Diagnosis Date Noted   Colovesical fistula 09/20/2020   Indwelling Foley catheter present 09/20/2020   Bilateral pulmonary embolism ([place]) 09/20/2020   Acute respiratory failure with hypoxia ([place]) 09/20/2020   Sepsis ([place]) 09/20/2020   Hyponatremia 09/04/2020   Diarrhea 09/04/2020   Decreased oral intake 09/04/2020   Acute urinary retention 09/04/2020   Colorectal cancer ([place]) 09/04/2020   Generalized muscle weakness 09/04/2020   Pressure injury of skin 08/30/2020   Malnutrition of moderate degree 08/30/2020   Hypokalemia 08/29/2020   Prediabetes 06/03/2017   Hypertension 05/28/2017   Heart murmur, systolic 75/30/0511   History of diabetes mellitus 05/28/2017   Obesity (BMI 30-39.9) 05/28/2017   Gait instability 05/28/2017   Chronic venous stasis 05/28/2017   Diabetes mellitus, type II ([place]) 09/29/2012   PCP:  Sofie Hartigan, MD Pharmacy:   [facility] [address], [place] [address] Phone: [phone] Fax: [phone]     Social Determinants of Health (SDOH) Interventions    Readmission Risk Interventions No flowsheet data found.

## 2020-09-25 DIAGNOSIS — I2699 Other pulmonary embolism without acute cor pulmonale: Secondary | ICD-10-CM

## 2020-09-25 DIAGNOSIS — Z515 Encounter for palliative care: Secondary | ICD-10-CM

## 2020-09-25 DIAGNOSIS — J9601 Acute respiratory failure with hypoxia: Secondary | ICD-10-CM

## 2020-09-25 LAB — CULTURE, BLOOD (ROUTINE X 2)
Culture: NO GROWTH
Culture: NO GROWTH

## 2020-09-25 LAB — CBC
HCT: 28.6 % — ABNORMAL LOW (ref 39.0–52.0)
Hemoglobin: 9.2 g/dL — ABNORMAL LOW (ref 13.0–17.0)
MCH: 27.1 pg (ref 26.0–34.0)
MCHC: 32.2 g/dL (ref 30.0–36.0)
MCV: 84.1 fL (ref 80.0–100.0)
Platelets: 228 10*3/uL (ref 150–400)
RBC: 3.4 MIL/uL — ABNORMAL LOW (ref 4.22–5.81)
RDW: 16.1 % — ABNORMAL HIGH (ref 11.5–15.5)
WBC: 9.5 10*3/uL (ref 4.0–10.5)
nRBC: 0 % (ref 0.0–0.2)

## 2020-09-25 LAB — HEPARIN LEVEL (UNFRACTIONATED): Heparin Unfractionated: 0.39 IU/mL (ref 0.30–0.70)

## 2020-09-25 MED ORDER — HALOPERIDOL LACTATE 2 MG/ML PO CONC
0.5000 mg | ORAL | Status: DC | PRN
  Filled 2020-09-25: qty 0.3

## 2020-09-25 MED ORDER — SODIUM CHLORIDE 0.9 % IV SOLN
1.0000 g | Freq: Two times a day (BID) | INTRAVENOUS | Status: DC
  Filled 2020-09-25 (×2): qty 1

## 2020-09-25 MED ORDER — GLYCOPYRROLATE 0.2 MG/ML IJ SOLN
0.2000 mg | INTRAMUSCULAR | Status: DC | PRN
  Filled 2020-09-25: qty 1

## 2020-09-25 MED ORDER — HALOPERIDOL 0.5 MG PO TABS
0.5000 mg | ORAL_TABLET | ORAL | Status: DC | PRN
  Filled 2020-09-25: qty 1

## 2020-09-25 MED ORDER — MORPHINE SULFATE 10 MG/5ML PO SOLN: 5.0000 mg | ORAL | Status: DC | PRN

## 2020-09-25 MED ORDER — OXYCODONE HCL 20 MG/ML PO CONC: 5.0000 mg | ORAL | Status: DC | PRN

## 2020-09-25 MED ORDER — GLYCOPYRROLATE 1 MG PO TABS
1.0000 mg | ORAL_TABLET | ORAL | Status: DC | PRN
  Filled 2020-09-25: qty 1

## 2020-09-25 MED ORDER — HALOPERIDOL LACTATE 5 MG/ML IJ SOLN: 0.5000 mg | INTRAMUSCULAR | Status: DC | PRN

## 2020-09-25 NOTE — Progress Notes (Signed)
[facility])  Referral received for Hospice Home for EOL care.  Met with patient and family, provided support and explained hospice.  There is not a bed to offer today at [facility].  Approval will be needed from [facility] MD prior to offering a bed.  Family is aware of next steps and that [place] will update once eligibility has been determined.  Thank you, Venia Carbon RN, BSN, [place] [facility] Liaison

## 2020-09-25 NOTE — Progress Notes (Signed)
Report given to [facility] Patient to transfer to 1C. Family at bedside. Patient and family updated with room assignment. Denies further needs at this time.

## 2020-09-25 NOTE — Assessment & Plan Note (Addendum)
80 year old Caucasian male patient with multiple problems-including colorectal cancer; colovesical fistula; chronic respiratory failure; currently admitted to hospital for bilateral PE on IV heparin noted to have hematuria  #Colorectal cancer/colovesical fistula-s/p evaluation at [facility] in April 2022.  Patient felt not a candidate for any surgical options given his multiple comorbidities-see discussion below  #Bilateral PE-on IV heparin; however noted to have hematuria  #Hematuria-likely secondary to colovesical fistula s/p Foley catheter  Recommendations:  #I had a long discussion with patient/daughter [name] and wife Stanton Kidney regarding patient's overall extremely poor prognosis given his underlying untreated malignancy.  Given the ongoing hematuria in the context of the bilateral PE-I would recommend discontinuation of IV heparin.  Patient is a poor candidate for IVC filter placement.  #I had a long discussion regarding transition to hospice poor prognosis.  Patient with the criteria for inpatient hospice admission.  Discussed with Education officer, museum.  Thank you Dr.Zhang for allowing me to participate in the care of your pleasant patient. Please do not hesitate to contact me with questions or concerns in the interim.  Discussed with Dr.Zhang.  Discussed with [employer]

## 2020-09-25 NOTE — TOC Progression Note (Addendum)
Transition of Care [facility]) - Progression Note    Patient Details  Name: Keith Reynolds MRN: [id number] Date of Birth: 07-06-1940  Transition of Care [facility]) CM/SW [place], LCSW Phone Number: 09/25/2020, 1:48 PM  Clinical Narrative:   CSW spoke with family and at this time they are requesting residential hospice at [place] is aware and looking at referral-- high risk assessment not appropriate at this time.   Expected Discharge Plan: [place] Barriers to Discharge: Continued Medical Work up  Expected Discharge Plan and Services Expected Discharge Plan: [place] In-house Referral: NA Discharge Planning Services: NA Post Acute Care Choice: [place] Living arrangements for the past 2 months: [place]                                       Social Determinants of Health (SDOH) Interventions    Readmission Risk Interventions No flowsheet data found.

## 2020-09-25 NOTE — Progress Notes (Signed)
PT Cancellation Note  Patient Details Name: Keith Reynolds MRN: [id number] DOB: [birth year]   Cancelled Treatment:    Reason Eval/Treat Not Completed: Other (comment). Per chart review, patient and family are declining SNF and want to go directly to LTC to focus on comfort. Poor prognosis. Due to change in goals of care, will discontinue PT services. Please re-order if condition changes.   Mikias Lanz 09/25/2020, 9:39 AM Greggory Stallion, PT, DPT [phone]

## 2020-09-25 NOTE — Progress Notes (Signed)
   09/25/20 1550  Clinical Encounter Type  Visited With Patient and family together  Visit Type Initial  Referral From Family  Consult/Referral To Chaplain  Spiritual Encounters  Spiritual Needs Prayer;Emotional;Grief support  Chaplain Azayla Polo visited with the pt per his daughter. Patient is an 80 year old man with multiple medical problems and recently diagnosed with colorectal cancer. He was hospitalized 5/20-5/25 with persistent diarrhea and severe hyponatremia.  He is now readmitted 09/20/2020 with sepsis and was found to have bilateral pulmonary emboli. Patient has been transitioned to comfort care and is on waiting list for hospice facility. I provided reflective listening, spiritual and emotional support and prayer was given and received at the end of the visited.

## 2020-09-25 NOTE — Care Management Important Message (Signed)
Important Message  Patient Details  Name: TIN ENGRAM MRN: [id number] Date of Birth: 16-Mar-1941   Medicare Important Message Given:  Yes     Dannette Barbara 09/25/2020, 10:57 AM

## 2020-09-25 NOTE — Progress Notes (Signed)
OT Cancellation Note  Patient Details Name: Keith Reynolds MRN: [id number] DOB: Apr 17, 1940   Cancelled Treatment:    Reason Eval/Treat Not Completed: Other (comment) Pt transitioned to comfort measures this date. Will complete OT order at this time and sign off. Thank you for involving OT in the care of this patient.   Gerrianne Scale, [place], OTR/L ascom [phone] 09/25/20, 2:35 PM

## 2020-09-25 NOTE — Consult Note (Signed)
ANTICOAGULATION CONSULT NOTE   Pharmacy Consult for heparin infusion Indication: pulmonary embolus  Allergies  Allergen Reactions   Antihistamines, Diphenhydramine-Type    Bactrim [Sulfamethoxazole-Trimethoprim] Nausea Only   Chamomile Other (See Comments)   Diphenhydramine Hcl Other (See Comments)    dizziness   Gabapentin    Levomenol    Pseudoephedrine Hcl Other (See Comments)    dizziness   Rosuvastatin    Triprolidine Hcl Other (See Comments)    dizziness    Patient Measurements: Height: 5\' 7"  (170.2 cm) Weight: 96.3 kg (212 lb 4.9 oz) IBW/kg (Calculated) : 66.1 Heparin Dosing Weight: 85 kg   Vital Signs: Temp: 98 F (36.7 C) (06/15 0342) BP: 139/65 (06/15 0342) Pulse Rate: 94 (06/15 0342)  Labs: Recent Labs    09/22/20 1620 09/23/20 0101 09/23/20 0537 09/23/20 1204 09/23/20 2255 09/24/20 0449 09/24/20 0949 09/24/20 1754 09/25/20 0446  HGB  --    < > 9.0*  --   --  9.7*  --   --  9.2*  HCT  --   --  27.4*  --   --  29.6*  --   --  28.6*  PLT  --   --  207  --   --  227  --   --  228  HEPARINUNFRC 0.67   < >  --  0.13*   < >  --  0.36 0.38 0.39  CREATININE  --   --   --  0.43*  --  0.48*  --   --   --   CKTOTAL 9*  --   --   --   --   --   --   --   --    < > = values in this interval not displayed.     Estimated Creatinine Clearance: 81.5 mL/min (A) (by C-G formula based on SCr of 0.48 mg/dL (L)).   Medical History: Past Medical History:  Diagnosis Date   Acute urinary retention 09/04/2020   Chronic venous stasis 05/28/2017   Colon cancer ([place])    reported by patient and wife   Diarrhea 09/04/2020   Fracture    right forearm, tibia/fibula   Gait instability 05/28/2017   Heart murmur, systolic 3/57/0177   Hernia, abdominal    History of diabetes mellitus 05/28/2017   Hypertension    Hypokalemia 08/29/2020   Hyponatremia 09/04/2020   Malnutrition of moderate degree 08/30/2020   Obesity (BMI 30-39.9) 05/28/2017   Pressure injury of skin 08/30/2020     Medications:  No prior anticoagulation noted Heparin Dosing Weight: 85 kg   Assessment: 80 y.o. male presents from a long-term care facility via EMS complaining of shortness of breathe. Found to have bilateral pulmonary emboli on CT. Pharmacy has been consulted for initiation and management of heparin infusion for PE. Per MD, patient has some hematuria reported 6/14. Onc-heme to see patient.   6/13 0139 HL  < 0.1, subtherapeutic (Per RN, pt to MRI 1045 pm - 1230 & switched to MRI tubing so potential interruption in heparin drip. ) 6/13 1204 HL = 0.13; subtherapeutic 6/13 2255 HL = 0.25, subtherapeutic 6/14 0949 HL = 0.36, therapeutic x 1 @ 2700 units/hr 6/14 1754 HL = 0.38, therapeutic x 2 6/15 0446 HL = 0.39  Goal of Therapy:  Heparin level 0.3-0.7 units/ml Monitor platelets by anticoagulation protocol: Yes   Plan:  Heparin level remains therapeutic. Will continue heparin infusion at 2700 units/hr. Recheck heparin with AM labs. CBC daily while on  heparin. Follow up with transition to PO anticoagulation.   Renda Rolls, PharmD, [facility] 09/25/2020 6:12 AM

## 2020-09-25 NOTE — Consult Note (Signed)
[place] CONSULT NOTE  Patient Care Team: Sofie Hartigan, MD as PCP - General (Family Medicine)  CHIEF COMPLAINTS/PURPOSE OF CONSULTATION:  PE/colorectal cancer  HISTORY OF PRESENTING ILLNESS:  Keith Reynolds 80 y.o.  male with multiple medical problems including diabetes hypertension colorectal cancer concerning for colovesical fistula with indwelling Foley catheter is currently admitted to hospital for rehab for worsening shortness of breath.  CTA showed bilateral pulmonary emboli.  Noted to have blood clot in the left lower extremity.  Patient also noted to have moderate bilateral pleural effusion.  Patient is currently on IV heparin.  Is currently on 4 L of oxygen.  Patient with regards to recent diagnosis of colorectal cancer-was evaluated [CT scans/pelvic MRI]at [facility] in April 2022-noted to have at least locally advanced malignancy with approximately 15 cm sigmoid colon mass involving the small bowel/also bladder.  Patient deemed not a candidate for any surgical options or systemic therapies given his multiple comorbidities.  Overall patient feels poorly.  Review of Systems  Constitutional:  Positive for malaise/fatigue and weight loss. Negative for chills, diaphoresis and fever.  HENT:  Negative for nosebleeds and sore throat.   Eyes:  Negative for double vision.  Respiratory:  Positive for cough and shortness of breath. Negative for hemoptysis, sputum production and wheezing.   Cardiovascular:  Negative for chest pain, palpitations, orthopnea and leg swelling.  Gastrointestinal:  Positive for abdominal pain and constipation. Negative for blood in stool, diarrhea, heartburn, melena, nausea and vomiting.  Genitourinary:  Positive for hematuria. Negative for dysuria, frequency and urgency.  Musculoskeletal:  Negative for back pain and joint pain.  Skin: Negative.  Negative for itching and rash.  Neurological:  Positive for weakness. Negative for dizziness,  tingling, focal weakness and headaches.  Endo/Heme/Allergies:  Does not bruise/bleed easily.  Psychiatric/Behavioral:  Negative for depression. The patient is not nervous/anxious and does not have insomnia.     MEDICAL HISTORY:  Past Medical History:  Diagnosis Date   Acute urinary retention 09/04/2020   Chronic venous stasis 05/28/2017   Colon cancer ([place])    reported by patient and wife   Diarrhea 09/04/2020   Fracture    right forearm, tibia/fibula   Gait instability 05/28/2017   Heart murmur, systolic 6/81/2751   Hernia, abdominal    History of diabetes mellitus 05/28/2017   Hypertension    Hypokalemia 08/29/2020   Hyponatremia 09/04/2020   Malnutrition of moderate degree 08/30/2020   Obesity (BMI 30-39.9) 05/28/2017   Pressure injury of skin 08/30/2020    SURGICAL HISTORY: Past Surgical History:  Procedure Laterality Date   COLONOSCOPY WITH PROPOFOL N/A 07/19/2020   Procedure: COLONOSCOPY WITH PROPOFOL;  Surgeon: Lesly Rubenstein, MD;  Location: [facility] ENDOSCOPY;  Service: Endoscopy;  Laterality: N/A;  C-19 07/18/2020 AM   COLONOSCOPY WITH PROPOFOL N/A 07/22/2020   Procedure: COLONOSCOPY WITH PROPOFOL;  Surgeon: Lesly Rubenstein, MD;  Location: [facility] ENDOSCOPY;  Service: Endoscopy;  Laterality: N/A;   EYE SURGERY     FRACTURE SURGERY     right forearm, tibia/fibula   HERNIA REPAIR     TONSILLECTOMY     VASECTOMY      SOCIAL HISTORY: Social History   Socioeconomic History   Marital status: Married    Spouse name: Not on file   Number of children: Not on file   Years of education: Not on file   Highest education level: Not on file  Occupational History   Not on file  Tobacco Use  Smoking status: Former    Packs/day: 0.25    Pack years: 0.00    Types: Cigarettes    Quit date: 04/14/1983    Years since quitting: 37.4   Smokeless tobacco: Never  Vaping Use   Vaping Use: Never used  Substance and Sexual Activity   Alcohol use: No   Drug use: No   Sexual activity:  Not on file  Other Topics Concern   Not on file  Social History Narrative   Not on file   Social Determinants of Health   Financial Resource Strain: Not on file  Food Insecurity: Not on file  Transportation Needs: Not on file  Physical Activity: Not on file  Stress: Not on file  Social Connections: Not on file  Intimate Partner Violence: Not on file    FAMILY HISTORY: Family History  Problem Relation Age of Onset   Heart attack Father    Diabetes Sister     ALLERGIES:  is allergic to antihistamines, diphenhydramine-type; bactrim [sulfamethoxazole-trimethoprim]; chamomile; diphenhydramine hcl; gabapentin; levomenol; pseudoephedrine hcl; rosuvastatin; and triprolidine hcl.  MEDICATIONS:  Current Facility-Administered Medications  Medication Dose Route Frequency Provider Last Rate Last Admin   acetaminophen (TYLENOL) tablet 650 mg  650 mg Oral Q6H PRN Athena Masse, MD       Or   acetaminophen (TYLENOL) suppository 650 mg  650 mg Rectal Q6H PRN Athena Masse, MD       Chlorhexidine Gluconate Cloth 2 % PADS 6 each  6 each Topical Daily Athena Masse, MD   6 each at 09/25/20 1129   glycopyrrolate (ROBINUL) tablet 1 mg  1 mg Oral Q4H PRN Sharen Hones, MD       Or   glycopyrrolate (ROBINUL) injection 0.2 mg  0.2 mg Subcutaneous Q4H PRN Sharen Hones, MD       Or   glycopyrrolate (ROBINUL) injection 0.2 mg  0.2 mg Intravenous Q4H PRN Sharen Hones, MD       haloperidol (HALDOL) tablet 0.5 mg  0.5 mg Oral Q4H PRN Sharen Hones, MD       Or   haloperidol (HALDOL) 2 MG/ML solution 0.5 mg  0.5 mg Sublingual Q4H PRN Sharen Hones, MD       Or   haloperidol lactate (HALDOL) injection 0.5 mg  0.5 mg Intravenous Q4H PRN Sharen Hones, MD       HYDROcodone-acetaminophen (NORCO/VICODIN) 5-325 MG per tablet 1-2 tablet  1-2 tablet Oral Q4H PRN Athena Masse, MD   2 tablet at 09/22/20 2130   MEDLINE mouth rinse  15 mL Mouth Rinse BID Athena Masse, MD   15 mL at 09/25/20 1129   morphine  10 MG/5ML solution 5 mg  5 mg Oral Q2H PRN Sharen Hones, MD       ondansetron [facility]) tablet 4 mg  4 mg Oral Q6H PRN Athena Masse, MD       Or   ondansetron [facility]) injection 4 mg  4 mg Intravenous Q6H PRN Athena Masse, MD       vitamin B-12 (CYANOCOBALAMIN) tablet 1,000 mcg  1,000 mcg Oral Daily Bhagat, Srishti L, MD   1,000 mcg at 09/24/20 0941      .  PHYSICAL EXAMINATION:  Vitals:   09/25/20 0342 09/25/20 0722  BP: 139/65 139/90  Pulse: 94 93  Resp: 16 18  Temp: 98 F (36.7 C) 97.9 F (36.6 C)  SpO2: 96% 96%   Filed Weights   09/20/20 1705 09/24/20 1001  Weight: 199 lb 15.3 oz (90.7 kg) 212 lb 4.9 oz (96.3 kg)    Physical Exam Constitutional:      Comments: Patient resting in the bed.  On 4 L of oxygen.  Accompanied by daughter and wife.  HENT:     Head: Normocephalic and atraumatic.     Mouth/Throat:     Pharynx: No oropharyngeal exudate.  Eyes:     Pupils: Pupils are equal, round, and reactive to light.  Cardiovascular:     Rate and Rhythm: Normal rate and regular rhythm.  Pulmonary:     Effort: No respiratory distress.     Breath sounds: No wheezing.     Comments: Decreased breath sounds bilaterally at bases.  No wheeze or crackles Abdominal:     General: Bowel sounds are normal. There is no distension.     Palpations: Abdomen is soft. There is no mass.     Tenderness: There is no abdominal tenderness. There is no guarding or rebound.  Genitourinary:    Comments: Positive for Foley catheter.  Positive for gross hematuria Musculoskeletal:        General: No tenderness. Normal range of motion.     Cervical back: Normal range of motion and neck supple.  Skin:    General: Skin is warm.  Neurological:     Mental Status: He is alert and oriented to person, place, and time.  Psychiatric:        Mood and Affect: Affect normal.     LABORATORY DATA:  I have reviewed the data as listed Lab Results  Component Value Date   WBC 9.5 09/25/2020   HGB  9.2 (L) 09/25/2020   HCT 28.6 (L) 09/25/2020   MCV 84.1 09/25/2020   PLT 228 09/25/2020   Recent Labs    08/29/20 0143 08/29/20 0914 09/20/20 1710 09/21/20 0500 09/22/20 0203 09/23/20 1204 09/24/20 0449  NA 122*   < > 122*   < > 124* 123* 126*  K 2.4*   < > 3.9   < > 3.7 3.9 4.0  CL 78*   < > 88*   < > 91* 90* 89*  CO2 31   < > 27   < > 30 30 31   GLUCOSE 200*   < > 172*   < > 180* 170* 174*  BUN 16   < > 29*   < > 25* 24* 21  CREATININE 0.41*   < > 0.47*   < > 0.47* 0.43* 0.48*  CALCIUM 8.5*   < > 7.6*   < > 7.4* 7.6* 7.7*  GFRNONAA >60   < > >60   < > >60 >60 >60  PROT 6.3*  --  5.0*  --   --   --   --   ALBUMIN 3.0*  --  2.1*  --   --   --   --   AST 22  --  15  --   --   --   --   ALT 14  --  9  --   --   --   --   ALKPHOS 64  --  96  --   --   --   --   BILITOT 1.4*  --  1.4*  --   --   --   --    < > = values in this interval not displayed.    RADIOGRAPHIC STUDIES: I have personally reviewed the radiological images as listed and agreed with  the findings in the report. CT ABDOMEN PELVIS WO CONTRAST  Result Date: 09/01/2020 CLINICAL DATA:  Abdominal pain.  History of colon cancer. EXAM: CT ABDOMEN AND PELVIS WITHOUT CONTRAST TECHNIQUE: Multidetector CT imaging of the abdomen and pelvis was performed following the standard protocol without IV contrast. COMPARISON:  July 12, 2020. FINDINGS: Lower chest: Left lower lobe atelectasis or pneumonia is noted with associated pleural effusion. Small right pleural effusion is noted with adjacent subsegmental atelectasis. Hepatobiliary: No focal liver abnormality is seen. No gallstones, gallbladder wall thickening, or biliary dilatation. Pancreas: Unremarkable. No pancreatic ductal dilatation or surrounding inflammatory changes. Spleen: Normal in size without focal abnormality. Adrenals/Urinary Tract: Adrenal glands appear normal. Mild bilateral hydronephrosis is noted without obstructing calculus or ureteral dilatation. Moderate urinary  bladder distention is noted. Stomach/Bowel: The stomach appears normal. There is no small bowel dilatation. 16 x 6 cm mass is seen in the proximal sigmoid colon that is causing at least partial obstruction and consistent with a history of colonic malignancy. Vascular/Lymphatic: Aortic atherosclerosis. No enlarged abdominal or pelvic lymph nodes. Reproductive: Stable moderate prostatic enlargement. Other: There does appear to be subcutaneous edema involving the visualized portion of the proximal left thigh. Musculoskeletal: No acute or significant osseous findings. IMPRESSION: Left lower lobe atelectasis or pneumonia is noted with associated pleural effusion. Small right pleural effusion is noted with adjacent subsegmental atelectasis. Mild bilateral hydronephrosis is noted without obstructing calculus or ureteral dilatation. Moderate urinary bladder distention is noted. 16 x 6 cm mass is seen involving the proximal sigmoid colon which is causing at least partial obstruction and consistent with the history of colonic malignancy. Stable moderate prostatic enlargement. Subcutaneous edema is seen involving the visualized portion of the proximal left thigh. Aortic Atherosclerosis (ICD10-I70.0). Electronically Signed   By: Marijo Conception M.D.   On: 09/01/2020 15:29   CT Angio Chest PE W/Cm &/Or Wo Cm  Result Date: 09/20/2020 CLINICAL DATA:  Lethargic.  Low O2 sats. EXAM: CT ANGIOGRAPHY CHEST WITH CONTRAST TECHNIQUE: Multidetector CT imaging of the chest was performed using the standard protocol during bolus administration of intravenous contrast. Multiplanar CT image reconstructions and MIPs were obtained to evaluate the vascular anatomy. CONTRAST:  153mL OMNIPAQUE IOHEXOL 350 MG/ML SOLN COMPARISON:  None. FINDINGS: Cardiovascular: Pulmonary emboli are seen in the right upper and lower lobe pulmonary arterial branches and also in the left upper lobe. No evidence of right heart strain. Diffuse coronary artery  calcifications and moderate aortic calcifications. Tortuous aorta. No aneurysm. Mediastinum/Nodes: No mediastinal, hilar, or axillary adenopathy. Trachea and esophagus are unremarkable. Thyroid unremarkable. Lungs/Pleura: Moderate left pleural effusion and small right pleural effusion. Compressive atelectasis in the left lower lobe. Minimal right base atelectasis. Upper Abdomen: Imaging into the upper abdomen demonstrates no acute findings. Musculoskeletal: Abnormal soft tissue noted in the left lateral chest wall measuring up to 12.5 cm in AP dimension on image 80 of series 4. This may reflect large left lateral chest wall hematoma. This conceivably also could reflect asymmetric edema/anasarca as similar finding was seen on prior abdominal CT from 09/01/2020.No acute bony abnormality. Review of the MIP images confirms the above findings. IMPRESSION: Pulmonary emboli bilaterally, most notable in the right lung particularly right lower lobe. No evidence of right heart strain. Moderate left pleural effusion and small right pleural effusion. Compressive atelectasis in the left lower lobe. Diffuse coronary artery calcifications. Aortic Atherosclerosis (ICD10-I70.0). Critical Value/emergent results were called by telephone at the time of interpretation on 09/20/2020 at 7:36 pm to provider [facility]  BRAD[place]ER , who verbally acknowledged these results. Electronically Signed   By: Rolm Baptise M.D.   On: 09/20/2020 19:40   MR BRAIN W WO CONTRAST  Result Date: 09/23/2020 CLINICAL DATA:  Initial evaluation for metastatic disease evaluation. EXAM: MRI HEAD WITHOUT AND WITH CONTRAST TECHNIQUE: Multiplanar, multiecho pulse sequences of the brain and surrounding structures were obtained without and with intravenous contrast. CONTRAST:  7.45mL GADAVIST GADOBUTROL 1 MMOL/ML IV SOLN COMPARISON:  None available. FINDINGS: Brain: Diffuse prominence of the CSF containing spaces compatible with generalized cerebral atrophy. 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 No associated FLAIR or susceptibility artifact. 9 mm focus of diffusion abnormality seen involving the subcortical aspect of the left frontal centrum semi ovale (series 2, image 39). Associated FLAIR signal abnormality without definite ADC correlate. No associated enhancement. Finding favored to reflect an evolving subacute white matter infarct. No associated hemorrhage or mass effect. No other diffusion abnormality to suggest acute or subacute ischemia. Gray-white matter differentiation otherwise maintained. No other areas of encephalomalacia to suggest chronic cortical infarction. No foci of susceptibility artifact to suggest acute or chronic intracranial hemorrhage. No mass lesion, midline shift or mass effect. Mild ventricular prominence related global parenchymal volume loss without hydrocephalus. No extra-axial fluid collection. Pituitary gland and suprasellar region within normal limits. Midline structures intact and within normal limits. No abnormal enhancement or evidence for intracranial metastatic disease. Vascular: Major intracranial vascular flow voids are maintained. Skull and upper cervical spine: Craniocervical junction within normal limits. Bone marrow signal intensity within normal limits. Probable small benign hemangioma noted within the left lateral mass of C1. No other focal marrow replacing lesions. No scalp soft tissue abnormality. Sinuses/Orbits: Patient status post bilateral ocular lens replacement. Globes and orbital soft tissues demonstrate no acute finding. Mild scattered mucosal thickening noted within the ethmoidal air cells. Paranasal sinuses are otherwise largely clear. Small bilateral mastoid effusions. Visualized nasopharynx is unremarkable. Inner ear structures grossly normal. Other: None.  IMPRESSION: 1. 9 mm focus of diffusion abnormality involving the subcortical aspect of the left frontal centrum semi ovale, favored to reflect an evolving subacute white matter infarct. No associated hemorrhage or mass effect. 2. No other acute intracranial abnormality. No evidence for intracranial metastatic disease. 3. 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 Electronically Signed   By: Jeannine Boga M.D.   On: 09/23/2020 01:35   MR CERVICAL SPINE W WO CONTRAST  Result Date: 09/23/2020 CLINICAL DATA:  Initial evaluation for myelopathy. History of colorectal cancer. EXAM: MRI CERVICAL SPINE WITHOUT AND WITH CONTRAST TECHNIQUE: Multiplanar and multiecho pulse sequences of the cervical spine, to include the craniocervical junction and cervicothoracic junction, were obtained without and with intravenous contrast. CONTRAST:  7.68mL GADAVIST GADOBUTROL 1 MMOL/ML IV SOLN COMPARISON:  None available. FINDINGS: Alignment: Examination moderately degraded by motion artifact. Straightening of the normal cervical lordosis. No significant listhesis. Vertebrae: Vertebral body height maintained without acute or chronic fracture. Bone marrow signal intensity somewhat diffusely decreased on T1 weighted imaging, nonspecific, but most commonly related to anemia, smoking, or obesity. Single 8 mm T1/T2 hyperintense lesion partially visualized within the left lateral mass of C1, nonspecific, but favored to reflect an atypical hemangioma. No other discrete or worrisome osseous lesions. No other abnormal marrow edema or enhancement. Cord: Signal intensity  within the cervical spinal cord is grossly within normal limits on this motion degraded exam. No convincing cord signal change or abnormal enhancement. Posterior Fossa, vertebral arteries, paraspinal tissues: Craniocervical junction within  normal limits. Diffuse edema noted within the posterior paraspinous soft tissues of the partially visualized upper back, mild specific, but could be related to overall volume status. No visible loculated collections. Prevertebral soft tissues within normal limits. Normal flow voids seen within the vertebral arteries bilaterally. Disc levels: C2-C3: Left eccentric disc bulge with associated left-sided uncinate spurring. Mild bilateral facet hypertrophy. No significant spinal stenosis. Moderate to severe left C3 foraminal stenosis. Right neural foramina remains patent. C3-C4: Disc bulge with bilateral uncovertebral hypertrophy. Left-sided facet degeneration. Mild spinal stenosis. Severe bilateral C4 foraminal narrowing. C4-C5: Disc bulge with uncovertebral hypertrophy. Secondary flattening and partial effacement of the ventral thecal sac, slightly asymmetric to the right. Superimposed facet degeneration. Resultant moderate spinal stenosis. Severe left worse than right C5 foraminal narrowing. C5-C6: Degenerative intervertebral disc space narrowing with diffuse disc bulge, with uncovertebral and endplate spurring. Posterior disc osteophyte flattens and effaces the ventral thecal sac. Moderate spinal stenosis with mild cord flattening, but no definite cord signal changes. Superimposed mild facet hypertrophy. Severe right worse than left C6 foraminal narrowing. C6-C7: Diffuse disc bulge with uncovertebral and endplate spurring, asymmetric to the right. Flattening and effacement of the ventral thecal sac. Resultant moderate to severe spinal stenosis. Thecal sac measures 7 mm in AP diameter. Mild cord flattening without definite cord signal changes. Severe right worse than left C7 foraminal narrowing. C7-T1: Grossly negative interspace. No visible spinal stenosis. Foramina appear patent. IMPRESSION: 1. Technically limited exam due to extensive motion artifact. 2. Normal MRI appearance of the cervical spinal cord. No cord  signal changes to suggest myelopathy. No evidence for metastatic disease within the cervical spine. Multilevel cervical spondylosis with resultant diffuse spinal stenosis at C3-4 through C6-7, moderate to severe in nature at the C6-7 level. Associated severe bilateral C4 through C7 foraminal stenosis. 3. Diffuse edema within the posterior paraspinous soft tissues of the partially visualized upper back, nonspecific, but could be related to overall volume status. Electronically Signed   By: Jeannine Boga M.D.   On: 09/23/2020 01:53   MR THORACIC SPINE W WO CONTRAST  Result Date: 09/23/2020 CLINICAL DATA:  Initial evaluation for myelopathy. History of colorectal cancer. EXAM: MRI THORACIC WITHOUT AND WITH CONTRAST TECHNIQUE: Multiplanar and multiecho pulse sequences of the thoracic spine were obtained without and with intravenous contrast. CONTRAST:  7.40mL GADAVIST GADOBUTROL 1 MMOL/ML IV SOLN COMPARISON:  None available. FINDINGS: Alignment: Mild dextroscoliosis with exaggeration of the normal thoracic kyphosis. No listhesis. Vertebrae: Mild chronic height loss with associated Schmorl's node deformity noted at the superior endplate of L1. Vertebral body height otherwise maintained without acute fracture. Underlying bone marrow signal intensity mildly decreased on T1 weighted imaging, nonspecific, but most commonly related to anemia, smoking, or obesity. Few scattered T1/T2 hyperintense lesions noted, most characteristic of a small benign hemangiomata. Largest of these measures 7 mm at the T6 vertebral body. No other discrete or worrisome osseous lesions. No abnormal marrow edema or enhancement. Cord: Signal intensity within the thoracic spinal cord is grossly within normal limits on this technically limited exam. No convincing cord signal abnormality or abnormal enhancement. Paraspinal and other soft tissues: Mild diffuse edema within the posterior paraspinous soft tissues of the upper back. Large layering  bilateral pleural effusions. Paraspinous soft tissues demonstrate no other acute finding. Disc levels: No significant disc pathology  seen within the thoracic spine for age. No significant disc bulge or focal disc herniation. Mild for age lower thoracic facet hypertrophy. No significant spinal stenosis. Foramina appear patent. No impingement. IMPRESSION: 1. Technically limited exam due to motion artifact. 2. Grossly normal MRI appearance of the thoracic spinal cord. No cord signal changes to suggest myelopathy. No evidence for metastatic disease. 3. No significant disc pathology or stenosis. No evidence for neural impingement. 4. Large layering bilateral pleural effusions. 5. Mild diffuse edema within the posterior paraspinous soft tissues of the upper back, nonspecific, but could be related to overall volume status. Electronically Signed   By: Jeannine Boga M.D.   On: 09/23/2020 02:12   CT Abdomen Pelvis W Contrast  Addendum Date: 09/20/2020   ADDENDUM REPORT: 09/20/2020 19:42 ADDENDUM: After discussing the case with Dr. Cheri Fowler, there is clinical concern for possible colovesical fistula. The abnormal bladder wall thickening is most pronounced superiorly where the bladder wall is inseparable from the large sigmoid colonic mass. Given the history, colovesical fistula is likely. Electronically Signed   By: Rolm Baptise M.D.   On: 09/20/2020 19:42   Result Date: 09/20/2020 CLINICAL DATA:  Lethargic. Fever. Abdominal abscess/infection expected EXAM: CT ABDOMEN AND PELVIS WITH CONTRAST TECHNIQUE: Multidetector CT imaging of the abdomen and pelvis was performed using the standard protocol following bolus administration of intravenous contrast. CONTRAST:  135mL OMNIPAQUE IOHEXOL 350 MG/ML SOLN COMPARISON:  09/01/2020 FINDINGS: Lower chest: Bilateral pleural effusions, left larger than right. Compressive atelectasis in the lung bases. Heart is borderline in size. Diffuse coronary artery calcifications and  distal thoracic aortic calcifications. Hepatobiliary: No focal hepatic abnormality. Gallbladder unremarkable. Pancreas: No focal abnormality or ductal dilatation. Spleen: No focal abnormality.  Normal size. Adrenals/Urinary Tract: Foley catheter within the bladder. Bladder wall is irregular and thickened. There appears to be mucosal enhancement. Appearance is concerning for possible cystitis. Large anterior bladder wall diverticulum noted. Mild bilateral hydronephrosis, right greater than left, slightly improved since prior study. Stomach/Bowel: Complex irregular mass again seen in the proximal to mid sigmoid colon, unchanged since recent study compatible with. No bowel obstruction. Stomach and small bowel decompressed, grossly unremarkable. Colonic malignancy Vascular/Lymphatic: Aortic atherosclerosis. No evidence of aneurysm or adenopathy. Reproductive: Prostate enlargement Other: No free fluid or free air. Musculoskeletal: No acute bony abnormality. Mild compression fractures through the inferior L3 vertebral body is similar to prior study. IMPRESSION: Large complex sigmoid colonic mass again noted compatible with colon cancer. No evidence of bowel obstruction. Mild bladder wall thickening with apparent mucosal enhancement concerning for possible cystitis. Recommend clinical correlation. Foley catheter in place. Large anterior bladder wall diverticulum. Mild bilateral hydronephrosis, right greater than left, slightly improved since prior study. Prostate enlargement. Bilateral pleural effusions, left greater than right. Compressive atelectasis in the left lower lobe. Diffuse coronary artery disease.  Aortic atherosclerosis. Electronically Signed: By: Rolm Baptise M.D. On: 09/20/2020 19:32   US RENAL  Result Date: 09/01/2020 CLINICAL DATA:  ATN EXAM: RENAL / URINARY TRACT ULTRASOUND COMPLETE COMPARISON:  CT 09/01/2020 FINDINGS: Right Reynolds: Renal measurements: 12.7 x 6.7 x 5.6 cm = volume: 252 mL. Moderate  right hydronephrosis, similar to prior CT. Normal echotexture. No mass. Left Reynolds: Renal measurements: 13.8 x 6.3 x 5.7 cm = volume: 260 mL. 12 mm medial midpole cyst. Mild left hydronephrosis, similar to prior study. Bladder: Layering debris within the bladder.  No bladder wall thickening. Other: None. IMPRESSION: Bilateral hydronephrosis, right greater than left, similar to prior CT. Layering debris within the bladder. Electronically  Signed   By: Rolm Baptise M.D.   On: 09/01/2020 15:24   US Venous Img Lower Bilateral (DVT)  Result Date: 09/21/2020 CLINICAL DATA:  Swelling, bilateral pulmonary emboli EXAM: BILATERAL LOWER EXTREMITY VENOUS DOPPLER ULTRASOUND TECHNIQUE: Gray-scale sonography with graded compression, as well as color Doppler and duplex ultrasound were performed to evaluate the lower extremity deep venous systems from the level of the common femoral vein and including the common femoral, femoral, profunda femoral, popliteal and calf veins including the posterior tibial, peroneal and gastrocnemius veins when visible. The superficial great saphenous vein was also interrogated. Spectral Doppler was utilized to evaluate flow at rest and with distal augmentation maneuvers in the common femoral, femoral and popliteal veins. COMPARISON:  Ultrasound 08/29/2020 FINDINGS: RIGHT LOWER EXTREMITY Common Femoral Vein: No evidence of thrombus. Normal compressibility, respiratory phasicity and response to augmentation. Saphenofemoral Junction: No evidence of thrombus. Normal compressibility and flow on color Doppler imaging. Profunda Femoral Vein: No evidence of thrombus. Normal compressibility and flow on color Doppler imaging. Femoral Vein: No evidence of thrombus. Normal compressibility, respiratory phasicity and response to augmentation. Popliteal Vein: No evidence of thrombus. Normal compressibility, respiratory phasicity and response to augmentation. Calf Veins: No evidence of thrombus. Normal  compressibility and flow on color Doppler imaging. Superficial Great Saphenous Vein: No evidence of thrombus. Normal compressibility. Venous Reflux:  None. Other Findings:  Lower extremity edema. LEFT LOWER EXTREMITY Common Femoral Vein: Nonocclusive thrombus is present. Normal respiratory phasicity. Saphenofemoral Junction: Nonocclusive thrombus is present. Profunda Femoral Vein: Nonocclusive thrombus is present. Femoral Vein: Nearly occlusive thrombus in the proximal femoral vein. The mid and distal femoral vein is patent. Popliteal Vein: No evidence of thrombus. Calf Veins: Nonocclusive thrombus is present in the anterior and posterior tibial veins and the peroneal vein without compressibility. Superficial Great Saphenous Vein: Patent. Venous Reflux:  Not applicable. Other Findings:  Lower extremity edema. IMPRESSION: Positive for DVT in the left lower extremity with nonocclusive thrombus in the common femoral vein, saphenofemoral junction, profundus femoris, proximal femoral vein, and calf veins. Thrombus in the proximal femoral vein appears nearly occlusive, though mid and distal femoral vein are patent. No evidence of DVT in the right lower extremity. Electronically Signed   By: Maurine Simmering   On: 09/21/2020 11:40   US Venous Img Lower Bilateral  Result Date: 08/29/2020 CLINICAL DATA:  Bilateral leg swelling and pain EXAM: BILATERAL LOWER EXTREMITY VENOUS DOPPLER ULTRASOUND TECHNIQUE: Gray-scale sonography with compression, as well as color and duplex ultrasound, were performed to evaluate the deep venous system(s) from the level of the common femoral vein through the popliteal and proximal calf veins. COMPARISON:  None. FINDINGS: VENOUS Normal compressibility of the common femoral, superficial femoral, and popliteal veins, as well as the visualized calf veins. Visualized portions of profunda femoral vein and great saphenous vein unremarkable. No filling defects to suggest DVT on grayscale or color Doppler  imaging. Doppler waveforms show normal direction of venous flow, normal respiratory plasticity and response to augmentation. OTHER None. Limitations: none IMPRESSION: Negative. Electronically Signed   By: Fidela Salisbury MD   On: 08/29/2020 06:05   DG Chest Port 1 View  Result Date: 09/20/2020 CLINICAL DATA:  Lethargy and possible sepsis, initial encounter EXAM: PORTABLE CHEST 1 VIEW COMPARISON:  None. FINDINGS: Cardiac shadow is enlarged. Aortic calcifications are noted. Left-sided pleural effusion and underlying atelectasis/infiltrate is seen. Right lung is clear. Mild central vascular congestion is noted. No bony abnormality is noted. IMPRESSION: Left-sided effusion with underlying atelectasis/infiltrate. Mild vascular  congestion. Electronically Signed   By: Inez Catalina M.D.   On: 09/20/2020 17:42   DG Abd Portable 1V  Result Date: 09/01/2020 CLINICAL DATA:  Rigidity of the abdomen, history of colon cancer with hernia and hypertension, former smoker. EXAM: PORTABLE ABDOMEN - 1 VIEW COMPARISON:  CT abdomen and pelvis from April of 2021. FINDINGS: Elevated LEFT hemidiaphragm as before with signs of potential LEFT lower lobe airspace disease. Tortuous and distended colon in the central abdomen. Colon measuring up to 11 cm. This could represent the cecum in the midline based on previous imaging. Small amount of rectal gas likely present though difficult to determine IMPRESSION: 1. Tortuous and distended colon in the central abdomen up to 12 cm. Findings are concerning for worsening distal colonic obstruction in the setting of colonic mass. CT may be helpful for further assessment. 2. Elevated LEFT hemidiaphragm with signs of potential LEFT lower lobe airspace disease. Electronically Signed   By: Zetta Bills M.D.   On: 09/01/2020 12:20   ECHOCARDIOGRAM COMPLETE  Result Date: 08/30/2020    ECHOCARDIOGRAM REPORT   Patient Name:   Keith Reynolds Date of Exam: 08/29/2020 Medical Rec #:  [id number]           Height:       67.0 in Accession #:    [id number]         Weight:       140.0 lb Date of Birth:  May 09, 1940          BSA:          1.738 m Patient Age:    57 years           BP:           159/77 mmHg Patient Gender: M                  HR:           87 bpm. Exam Location:  [facility] Procedure: 2D Echo, Cardiac Doppler and Color Doppler Indications:     R94.31 Abnormal EKG  History:         Patient has no prior history of Echocardiogram examinations.                  Risk Factors:Hypertension.  Sonographer:     Wilford Sports Rodgers-Jones Referring Phys:  [id number] Cochran Diagnosing Phys: Yolonda Kida MD IMPRESSIONS  1. Left ventricular ejection fraction, by estimation, is 60 to 65%. The left ventricle has normal function. The left ventricle has no regional wall motion abnormalities. Left ventricular diastolic parameters are consistent with Grade I diastolic dysfunction (impaired relaxation).  2. Right ventricular systolic function is normal. The right ventricular size is normal.  3. The mitral valve is normal in structure. No evidence of mitral valve regurgitation.  4. The aortic valve is grossly normal. Aortic valve regurgitation is not visualized. FINDINGS  Left Ventricle: Left ventricular ejection fraction, by estimation, is 60 to 65%. The left ventricle has normal function. The left ventricle has no regional wall motion abnormalities. The left ventricular internal cavity size was normal in size. There is  borderline left ventricular hypertrophy. Left ventricular diastolic parameters are consistent with Grade I diastolic dysfunction (impaired relaxation). Right Ventricle: The right ventricular size is normal. No increase in right ventricular wall thickness. Right ventricular systolic function is normal. Left Atrium: Left atrial size was normal in size. Right Atrium: Right atrial size was normal in size. Pericardium: There is no evidence of pericardial  effusion. Mitral Valve: The mitral valve is normal in structure.  No evidence of mitral valve regurgitation. Tricuspid Valve: The tricuspid valve is normal in structure. Tricuspid valve regurgitation is trivial. Aortic Valve: The aortic valve is grossly normal. Aortic valve regurgitation is not visualized. Pulmonic Valve: The pulmonic valve was normal in structure. Pulmonic valve regurgitation is not visualized. Aorta: The ascending aorta was not well visualized. IAS/Shunts: No atrial level shunt detected by color flow Doppler.  LEFT VENTRICLE PLAX 2D LVIDd:         4.89 cm  Diastology LVIDs:         3.18 cm  LV e' medial:    5.22 cm/s LV PW:         0.94 cm  LV E/e' medial:  12.8 LV IVS:        0.96 cm  LV e' lateral:   6.64 cm/s LVOT diam:     2.00 cm  LV E/e' lateral: 10.1 LV SV:         68 LV SV Index:   39 LVOT Area:     3.14 cm  RIGHT VENTRICLE             IVC RV Basal diam:  3.77 cm     IVC diam: 1.37 cm RV S prime:     23.20 cm/s TAPSE (M-mode): 2.8 cm LEFT ATRIUM             Index       RIGHT ATRIUM           Index LA diam:        4.80 cm 2.76 cm/m  RA Area:     11.40 cm LA Vol (A2C):   46.8 ml 26.93 ml/m RA Volume:   25.10 ml  14.44 ml/m LA Vol (A4C):   47.3 ml 27.22 ml/m LA Biplane Vol: 47.8 ml 27.51 ml/m  AORTIC VALVE LVOT Vmax:   103.00 cm/s LVOT Vmean:  75.700 cm/s LVOT VTI:    0.216 m  AORTA Ao Root diam: 3.20 cm MITRAL VALVE MV Area (PHT): 2.62 cm    SHUNTS MV Decel Time: 289 msec    Systemic VTI:  0.22 m MV E velocity: 66.80 cm/s  Systemic Diam: 2.00 cm MV A velocity: 94.30 cm/s MV E/A ratio:  0.71 Dwayne D Callwood MD Electronically signed by Yolonda Kida MD Signature Date/Time: 08/30/2020/12:35:09 PM    Final     Colorectal cancer [facility]) 80 year old Caucasian male patient with multiple problems-including colorectal cancer; colovesical fistula; chronic respiratory failure; currently admitted to hospital for bilateral PE on IV heparin noted to have hematuria  #Colorectal cancer/colovesical fistula-s/p evaluation at [facility] in April 2022.  Patient felt  not a candidate for any surgical options given his multiple comorbidities-see discussion below  #Bilateral PE-on IV heparin; however noted to have hematuria  #Hematuria-likely secondary to colovesical fistula s/p Foley catheter  Recommendations:  #I had a long discussion with patient/daughter Keith Reynolds and wife Keith Reynolds regarding patient's overall extremely poor prognosis given his underlying untreated malignancy.  Given the ongoing hematuria in the context of the bilateral PE-I would recommend discontinuation of IV heparin.  Patient is a poor candidate for IVC filter placement.  #I had a long discussion regarding transition to hospice poor prognosis.  Patient with the criteria for inpatient hospice admission.  Discussed with Education officer, museum.  Thank you Dr.Zhang for allowing me to participate in the care of your pleasant patient. Please do not hesitate to contact me with questions  or concerns in the interim.  Discussed with Dr.Zhang.  Discussed with [employer]  All questions were answered. The patient knows to call the clinic with any problems, questions or concerns.     Cammie Sickle, MD 09/25/2020 10:08 PM

## 2020-09-25 NOTE — Progress Notes (Addendum)
PROGRESS NOTE    Keith Reynolds  LXB:[id number] DOB: 08/03/1940 DOA: 09/20/2020 PCP: Sofie Hartigan, MD   Chief complaint.  Hypoxia. Brief Narrative:  Keith Reynolds is a 80 y.o. male with medical history significant for DM, HTN, recently diagnosed colorectal cancer, with known colovesical fistula with indwelling Foley, recently hospitalized from 5/19-5/25 for persistent diarrhea and severe hyponatremia, improved and discharged to SNF who was in his usual state of health until the morning of admission when he had an outpatient doctor's appointment however by the afternoon he was noted to be lethargic at the facility, febrile at 101.2 with O2 sats 80% on room air.  Requiring [facility] for transport with EMS.  Patient has been having continued watery diarrhea and poor appetite but no vomiting.  He has suprapubic abdominal pain with mild to moderate intensity, nonradiating.  Has some cough and shortness of breath but no chest pain.  He has generalized weakness. ED course: On arrival, tachycardic at 113, tachypneic at 22 with O2 sat of 95 on nonrebreather.  Afebrile with BP 117/60 CT angiogram of the chest showed bilateral pulmonary emboli, duplex ultrasound showed left lower extremity DVT.  CT abdomen/pelvis showed large colon mass with colovesical fistula. Patient also had an MRI of the brain, cervical spine thoracic spine, no metastasis was identified.  It did show white matter stroke in the left frontal lobe. Patient is treated with IV heparin, he developed gross hematuria. After discussion with oncology, patient is seen by palliative care.  Patient prognosis is poor.  Assessment & Plan:   Principal Problem:   Bilateral pulmonary embolism (HCC) Active Problems:   Diabetes mellitus, type II ([place])   Hypertension   Hyponatremia   Diarrhea   Colorectal cancer ([place])   Colovesical fistula   Indwelling Foley catheter present   Acute respiratory failure with hypoxia ([place])   Sepsis  ([place])  1.  Bilateral pulmonary embolism. Acute respiratory failure with hypoxia. Patient still on oxygen, continue heparin for now. Had a long discussion with daughter and the patient, due to patient poor prognosis, patient is not a candidate for surgery.  Discussed with oncology, recommended palliative care/hospice care.  Daughter will discuss with the rest of family, and make decision tomorrow. For now, we will continue heparin drip until decision is made about hospice.  #2.  Sepsis. Acute cystitis secondary to colovesicular fistula and indwelling Foley catheter. Colovesicular fistula due to metastatic colon cancer. Colorectal cancer. Gross hematuria. Patient is still on antibiotics, will just make a decision tomorrow if additional treatment is warranted.  #3.  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 Progressive vision loss. Bilateral lower extremity weakness. Condition most likely due to stroke.  Due to poor prognosis, no additional work-up or treatment is indicated.  #4.  Type 2 diabetes Essential hypertension  #5. Pressure ulcer POA. Pressure Injury 08/29/20 Heel Right Deep Tissue Pressure Injury - Purple or maroon localized area of discolored intact skin or blood-filled blister due to damage of underlying soft tissue from pressure and/or shear. reddened (Active)  08/29/20 1800  Location: Heel  Location Orientation: Right  Staging: Deep Tissue Pressure Injury - Purple or maroon localized area of discolored intact skin or blood-filled blister due to damage of underlying soft tissue from pressure and/or shear.  Wound Description (Comments): reddened  Present on Admission: Yes     Pressure Injury 09/21/20 Sacrum Medial Stage 3 -  Full thickness tissue loss. Subcutaneous fat may be visible but bone, tendon or muscle  are NOT exposed. (Active)  09/21/20 1600  Location: Sacrum  Location Orientation: Medial  Staging: Stage 3 -  Full thickness tissue loss. Subcutaneous fat  may be visible but bone, tendon or muscle are NOT exposed.  Wound Description (Comments):   Present on Admission: Yes     Pressure Injury 09/21/20 Buttocks Right Stage 3 -  Full thickness tissue loss. Subcutaneous fat may be visible but bone, tendon or muscle are NOT exposed. (Active)  09/21/20 1600  Location: Buttocks  Location Orientation: Right  Staging: Stage 3 -  Full thickness tissue loss. Subcutaneous fat may be visible but bone, tendon or muscle are NOT exposed.  Wound Description (Comments):   Present on Admission: Yes    1130 AM.  Met the patient daughter and the patient wife, decision was made to transition to comfort care.  Discussed with hospice, planning to inpatient hospice versus long-term care with hospice tomorrow.    DVT prophylaxis: Heparin Code Status: DNR Family Communication: Discussed with patient daughter and the patient.  Pending decision about hospice care. Disposition Plan:    Status is: Inpatient  Remains inpatient appropriate because:IV treatments appropriate due to intensity of illness or inability to take PO and Inpatient level of care appropriate due to severity of illness  Dispo: The patient is from: Home              Anticipated d/c is to: LTAC              Patient currently is not medically stable to d/c.   Difficult to place patient No        I/O last 3 completed shifts: In: 1793.1 [P.O.:480; I.V.:1113.1; IV Piggyback:200] Out: 5000 [Urine:5000] Total I/O In: 612.7 [P.O.:240; I.V.:372.7] Out: -      Consultants:  ONCOLOGY  Procedures: nONE  Antimicrobials: Cefepime  Subjective: Patient continue has significant gross hematuria.  Still on high flow oxygen.  Does not feel short of breath.  No cough.  No chest pain. No fever or chills. No abdominal pain or nausea vomiting. No chest pain or palpitation.  Objective: Vitals:   09/24/20 1732 09/24/20 2005 09/25/20 0342 09/25/20 0722  BP: 137/64 131/70 139/65 139/90  Pulse: 89  93 94 93  Resp: (!) _0 Temp: (!) 97.5 F (36.4 C) 98 F (36.7 C) 98 F (36.7 C) 97.9 F (36.6 C)  TempSrc:      SpO2: 96% 95% 96% 96%  Weight:      Height:        Intake/Output Summary (Last 24 hours) at 09/25/2020 1028 Last data filed at 09/25/2020 1014 Gross per 24 hour  Intake 2165.78 ml  Output 2450 ml  Net -284.22 ml   Filed Weights   09/20/20 1705 09/24/20 1001  Weight: 90.7 kg 96.3 kg    Examination:  General exam: Appears calm and comfortable  Respiratory system: Clear to auscultation. Respiratory effort normal. Cardiovascular system: S1 & S2 heard, RRR. No JVD, murmurs, rubs, gallops or clicks. No pedal edema. Gastrointestinal system: Abdomen is nondistended, soft and nontender. No organomegaly or masses felt. Normal bowel sounds heard. Central nervous system: Alert and oriented x3. No focal neurological deficits. Extremities: Symmetric 5 x 5 power. Skin: No rashes, lesions or ulcers Psychiatry: Mood & affect appropriate.     Data Reviewed: I have personally reviewed following labs and imaging studies  CBC: Recent Labs  Lab 09/20/20 1710 09/21/20 0500 09/22/20 0203 09/23/20 0537 09/24/20 0449 09/25/20 0446  WBC 17.1*  16.9* 11.5* 7.7 10.0 9.5  NEUTROABS 14.2*  --   --   --   --   --   HGB 9.6* 9.2* 8.4* 9.0* 9.7* 9.2*  HCT 28.7* 28.1* 25.8* 27.4* 29.6* 28.6*  MCV 82.9 82.9 83.5 84.0 82.2 84.1  PLT 210 209 202 207 227 947   Basic Metabolic Panel: Recent Labs  Lab 09/20/20 1710 09/21/20 0500 09/22/20 0203 09/23/20 1204 09/24/20 0449  NA 122* 124*  125* 124* 123* 126*  K 3.9 3.4* 3.7 3.9 4.0  CL 88* 89* 91* 90* 89*  CO2 _0 GLUCOSE 172* 145* 180* 170* 174*  BUN 29* 22 25* 24* 21  CREATININE 0.47* 0.46* 0.47* 0.43* 0.48*  CALCIUM 7.6* 7.7* 7.4* 7.6* 7.7*   GFR: Estimated Creatinine Clearance: 81.5 mL/min (A) (by C-G formula based on SCr of 0.48 mg/dL (L)). Liver Function Tests: Recent Labs  Lab 09/20/20 1710  AST  15  ALT 9  ALKPHOS 96  BILITOT 1.4*  PROT 5.0*  ALBUMIN 2.1*   No results for input(s): LIPASE, AMYLASE in the last 168 hours. No results for input(s): AMMONIA in the last 168 hours. Coagulation Profile: Recent Labs  Lab 09/20/20 1710 09/21/20 0500  INR 1.5* 1.4*   Cardiac Enzymes: Recent Labs  Lab 09/22/20 1620  CKTOTAL 9*   BNP (last 3 results) No results for input(s): PROBNP in the last 8760 hours. HbA1C: No results for input(s): HGBA1C in the last 72 hours. CBG: No results for input(s): GLUCAP in the last 168 hours. Lipid Profile: Recent Labs    09/24/20 0449  CHOL 73  HDL 32*  LDLCALC 28  TRIG 65  CHOLHDL 2.3   Thyroid Function Tests: No results for input(s): TSH, T4TOTAL, FREET4, T3FREE, THYROIDAB in the last 72 hours. Anemia Panel: Recent Labs    09/22/20 1620  VITAMINB12 310   Sepsis Labs: Recent Labs  Lab 09/20/20 1711 09/20/20 1910 09/21/20 0500  PROCALCITON  --   --  0.35  LATICACIDVEN 1.0 1.0  --     Recent Results (from the past 240 hour(s))  Blood Culture (routine x 2)     Status: None   Collection Time: 09/20/20  5:11 PM   Specimen: BLOOD  Result Value Ref Range Status   Specimen Description BLOOD BLOOD RIGHT FOREARM  Final   Special Requests   Final    BOTTLES DRAWN AEROBIC AND ANAEROBIC Blood Culture results may not be optimal due to an inadequate volume of blood received in culture bottles   Culture   Final    NO GROWTH 5 DAYS Performed at [facility], [address] [place], [place] [address]    Report Status 09/25/2020 FINAL  Final  Blood Culture (routine x 2)     Status: None   Collection Time: 09/20/20  5:11 PM   Specimen: BLOOD  Result Value Ref Range Status   Specimen Description BLOOD RIGHT ANTECUBITAL  Final   Special Requests   Final    BOTTLES DRAWN AEROBIC AND ANAEROBIC Blood Culture results may not be optimal due to an inadequate volume of blood received in culture bottles   Culture   Final    NO  GROWTH 5 DAYS Performed at [facility], [address], [place], [place] [address]    Report Status 09/25/2020 FINAL  Final  Resp Panel by RT-PCR (Flu A&B, Covid) Nasopharyngeal Swab     Status: None   Collection Time: 09/20/20  5:18 PM  Specimen: Nasopharyngeal Swab; Nasopharyngeal(NP) swabs in vial transport medium  Result Value Ref Range Status   SARS Coronavirus 2 by RT PCR NEGATIVE NEGATIVE Final    Comment: (NOTE) SARS-CoV-2 target nucleic acids are NOT DETECTED.  The SARS-CoV-2 RNA is generally detectable in upper respiratory specimens during the acute phase of infection. The lowest concentration of SARS-CoV-2 viral copies this assay can detect is 138 copies/mL. A negative result does not preclude SARS-Cov-2 infection and should not be used as the sole basis for treatment or other patient management decisions. A negative result may occur with  improper specimen collection/handling, submission of specimen other than nasopharyngeal swab, presence of viral mutation(s) within the areas targeted by this assay, and inadequate number of viral copies(<138 copies/mL). A negative result must be combined with clinical observations, patient history, and epidemiological information. The expected result is Negative.  Fact Sheet for Patients:  EntrepreneurPulse.com.au  Fact Sheet for Healthcare Providers:  IncredibleEmployment.be  This test is no t yet approved or cleared by the [place] FDA and  has been authorized for detection and/or diagnosis of SARS-CoV-2 by FDA under an Emergency Use Authorization (EUA). This EUA will remain  in effect (meaning this test can be used) for the duration of the COVID-19 declaration under Section 564(b)(1) of the Act, 21 U.S.C.section 360bbb-3(b)(1), unless the authorization is terminated  or revoked sooner.       Influenza A by PCR NEGATIVE NEGATIVE Final   Influenza B by PCR NEGATIVE NEGATIVE  Final    Comment: (NOTE) The Xpert Xpress SARS-CoV-2/FLU/RSV plus assay is intended as an aid in the diagnosis of influenza from Nasopharyngeal swab specimens and should not be used as a sole basis for treatment. Nasal washings and aspirates are unacceptable for Xpert Xpress SARS-CoV-2/FLU/RSV testing.  Fact Sheet for Patients: EntrepreneurPulse.com.au  Fact Sheet for Healthcare Providers: IncredibleEmployment.be  This test is not yet approved or cleared by the [place] FDA and has been authorized for detection and/or diagnosis of SARS-CoV-2 by FDA under an Emergency Use Authorization (EUA). This EUA will remain in effect (meaning this test can be used) for the duration of the COVID-19 declaration under Section 564(b)(1) of the Act, 21 U.S.C. section 360bbb-3(b)(1), unless the authorization is terminated or revoked.  Performed at [facility], [address] [place], [place] [address]   Urine culture     Status: Abnormal   Collection Time: 09/21/20  5:01 AM   Specimen: In/Out Cath Urine  Result Value Ref Range Status   Specimen Description   Final    IN/OUT CATH URINE Performed at [facility], [address] [place], [place] [address]    Special Requests   Final    NONE Performed at [facility], [address] [place], [place] [address]    Culture MULTIPLE SPECIES PRESENT, SUGGEST RECOLLECTION (A)  Final   Report Status 09/22/2020 FINAL  Final  C Difficile Quick Screen w PCR reflex     Status: None   Collection Time: 09/22/20  9:00 AM   Specimen: STOOL  Result Value Ref Range Status   C Diff antigen NEGATIVE NEGATIVE Final   C Diff toxin NEGATIVE NEGATIVE Final   C Diff interpretation No C. difficile detected.  Final    Comment: Performed at [facility], [address], [place],  [address]         Radiology Studies: No results found.      Scheduled Meds:   Chlorhexidine Gluconate Cloth  6 each  Topical Daily   mouth rinse  15 mL Mouth Rinse BID   vitamin B-12  1,000 mcg Oral Daily   Continuous Infusions:  heparin 2,700 Units/hr (09/25/20 0726)     LOS: 5 days   Time spent: 36 minutes    Sharen Hones, MD [facility]   To contact the attending provider between 7A-7P or the covering provider during after hours 7P-7A, please log into the web site [email or web address] and access using universal [place] password for that web site. If you do not have the password, please call the hospital operator.  09/25/2020, 10:28 AM

## 2020-09-25 NOTE — Progress Notes (Signed)
Patient seen at Dr. Aletha Reynolds request.  He has been previously followed by Keith Reynolds with palliative care.  Patient is an 80 year old man with multiple medical problems including DM, HTN, recently diagnosed with colorectal cancer with known colovesical fistula and indwelling Foley, who was hospitalized 5/20-5/25 with persistent diarrhea and severe hyponatremia.  He is now readmitted 09/20/2020 with sepsis and was found to have bilateral pulmonary emboli.  Patient has been transitioned to comfort care and is on waiting list for hospice facility.  Spoke with family.  Patient is eating minimally, only bites and sips.  His prognosis is extremely poor in light of his bilateral pulmonary embolism with persistent acute hypoxic respiratory failure.  Agree with residential hospice.  Time Total: 15 minutes  Visit consisted of counseling and education dealing with the complex and emotionally intense issues of symptom management and palliative care in the setting of serious and potentially life-threatening illness.Greater than 50%  of this time was spent counseling and coordinating care related to the above assessment and plan.  Signed by: Keith Harm, PhD, NP-C

## 2020-09-25 NOTE — Progress Notes (Signed)
Palliative: [name] is resting quietly in bed with his daughter at bedside.  Daughter, Keith Reynolds, and I talk in the hallway.  She shares that [name] and family have elected for comfort care, requesting residential hospice.  We talked about comfort and dignity at end-of-life, comfort care and residential hospice referral.  Conference with attending, bedside nursing staff, transition of care team and local hospice representative related to patient condition, needs, goals of care. [place] form completed and placed on chart.  Plan:   Comfort measures only, requesting residential hospice in [place] Prognosis: 2 weeks or less expected based on advancing cancer diagnosis, request for comfort care and residential hospice.  25 minutes  Quinn Axe, NP Palliative medicine team  Team phone [phone] Greater than 50% of this time was spent counseling and coordinating care related to the above assessment and plan.

## 2020-09-26 MED ORDER — CEPHALEXIN 500 MG PO CAPS
500.0000 mg | ORAL_CAPSULE | Freq: Three times a day (TID) | ORAL | Status: DC
  Administered 2020-09-26 – 2020-09-27 (×3): 500 mg via ORAL
  Filled 2020-09-26 (×4): qty 1

## 2020-09-26 MED ORDER — CEPHALEXIN 500 MG PO CAPS: 500.0000 mg | ORAL_CAPSULE | Freq: Three times a day (TID) | ORAL | 0 refills | Status: AC

## 2020-09-26 MED ORDER — OXYCODONE-ACETAMINOPHEN 10-325 MG PO TABS: 1.0000 | ORAL_TABLET | Freq: Four times a day (QID) | ORAL | 0 refills | Status: AC | PRN

## 2020-09-26 NOTE — Progress Notes (Signed)
[facility])  Mr. Ebert is not appropriate for residential hospice currently.   Discussed with dtr [name] and Mr. Gurevich at the bedside. They are aware and are ok with returning to [facility] with hospice support.  Hospice will support this family once he returns to [facility].  DME is already in place and no other equipment is needed.  Thank you,  Venia Carbon RN, BSN, [facility] Liaison

## 2020-09-26 NOTE — Discharge Summary (Addendum)
Physician Discharge Summary  Patient ID: Keith Reynolds MRN: [id number] DOB/AGE: Jan 31, 1941 80 y.o.  Admit date: 09/20/2020 Discharge date: 09/26/2020  Admission Diagnoses:  Discharge Diagnoses:  Principal Problem:   Bilateral pulmonary embolism ([place]) Active Problems:   Diabetes mellitus, type II ([place])   Hypertension   Hyponatremia   Diarrhea   Colorectal cancer ([place])   Colovesical fistula   Indwelling Foley catheter present   Acute respiratory failure with hypoxia ([place])   Sepsis ([place])   Palliative care encounter   Discharged Condition: poor  Hospital Course:  Keith Reynolds is a 80 y.o. male with medical history significant for DM, HTN, recently diagnosed colorectal cancer, with known colovesical fistula with indwelling Foley, recently hospitalized from 5/19-5/25 for persistent diarrhea and severe hyponatremia, improved and discharged to SNF who was in his usual state of health until the morning of admission when he had an outpatient doctor's appointment however by the afternoon he was noted to be lethargic at the facility, febrile at 101.2 with O2 sats 80% on room air.  Requiring [facility] for transport with EMS.  Patient has been having continued watery diarrhea and poor appetite but no vomiting.  He has suprapubic abdominal pain with mild to moderate intensity, nonradiating.  Has some cough and shortness of breath but no chest pain.  He has generalized weakness. ED course: On arrival, tachycardic at 113, tachypneic at 22 with O2 sat of 95 on nonrebreather.  Afebrile with BP 117/60 CT angiogram of the chest showed bilateral pulmonary emboli, duplex ultrasound showed left lower extremity DVT.  CT abdomen/pelvis showed large colon mass with colovesical fistula. Patient also had an MRI of the brain, cervical spine thoracic spine, no metastasis was identified.  It did show white matter stroke in the left frontal lobe. Patient is treated with IV heparin, he developed gross  hematuria. After discussion with oncology, patient is seen by palliative care.  Patient prognosis is poor.   1.  Bilateral pulmonary embolism. Acute respiratory failure with hypoxia. Decision was made after talking to family not to treat PE.  Patient be transferred to long-term care with hospice.   #2.  Sepsis. Acute cystitis secondary to colovesicular fistula and indwelling Foley catheter. Colovesicular fistula due to metastatic colon cancer. Colorectal cancer. Gross hematuria. We will keep the Foley catheter.  Due to symptomatic UTI, treat for 7 days of oral antibiotics with Keflex.   #3.  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 Progressive vision loss. Bilateral lower extremity weakness. Condition most likely due to stroke.  Due to poor prognosis, no additional work-up or treatment is indicated.   #4.  Type 2 diabetes Essential hypertension   #5. Pressure ulcer POA. Pressure Injury 08/29/20 Heel Right Deep Tissue Pressure Injury - Purple or maroon localized area of discolored intact skin or blood-filled blister due to damage of underlying soft tissue from pressure and/or shear. reddened (Active)  08/29/20 1800  Location: Heel  Location Orientation: Right  Staging: Deep Tissue Pressure Injury - Purple or maroon localized area of discolored intact skin or blood-filled blister due to damage of underlying soft tissue from pressure and/or shear.  Wound Description (Comments): reddened  Present on Admission: Yes     Pressure Injury 09/21/20 Sacrum Medial Stage 3 -  Full thickness tissue loss. Subcutaneous fat may be visible but bone, tendon or muscle are NOT exposed. (Active)  09/21/20 1600  Location: Sacrum  Location Orientation: Medial  Staging: Stage 3 -  Full thickness tissue loss. Subcutaneous  fat may be visible but bone, tendon or muscle are NOT exposed.  Wound Description (Comments):   Present on Admission: Yes     Pressure Injury 09/21/20 Buttocks Right Stage 3  -  Full thickness tissue loss. Subcutaneous fat may be visible but bone, tendon or muscle are NOT exposed. (Active)  09/21/20 1600  Location: Buttocks  Location Orientation: Right  Staging: Stage 3 -  Full thickness tissue loss. Subcutaneous fat may be visible but bone, tendon or muscle are NOT exposed.  Wound Description (Comments):   Present on Admission: Yes    I was just notified that nursing does not have room today, will discharge tomorrow.  Cancel today's discharge.    Consults: hematology/oncology  Significant Diagnostic Studies:  MRI HEAD WITHOUT AND WITH CONTRAST   TECHNIQUE: Multiplanar, multiecho pulse sequences of the brain and surrounding structures were obtained without and with intravenous contrast.   CONTRAST:  7.48mL GADAVIST GADOBUTROL 1 MMOL/ML IV SOLN   COMPARISON:  None available.   FINDINGS: Brain: Diffuse prominence of the CSF containing spaces compatible with generalized cerebral atrophy. 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 No associated FLAIR or susceptibility artifact.   9 mm focus of diffusion abnormality seen involving the subcortical aspect of the left frontal centrum semi ovale (series 2, image 39). Associated FLAIR signal abnormality without definite ADC correlate. No associated enhancement. Finding favored to reflect an evolving subacute white matter infarct. No associated hemorrhage or mass effect.   No other diffusion abnormality to suggest acute or subacute ischemia. Gray-white matter differentiation otherwise maintained. No other areas of encephalomalacia to suggest chronic cortical infarction. No foci of susceptibility artifact to suggest acute or chronic intracranial hemorrhage.   No mass lesion, midline shift or mass effect. Mild ventricular prominence related  global parenchymal volume loss without hydrocephalus. No extra-axial fluid collection.   Pituitary gland and suprasellar region within normal limits. Midline structures intact and within normal limits.   No abnormal enhancement or evidence for intracranial metastatic disease.   Vascular: Major intracranial vascular flow voids are maintained.   Skull and upper cervical spine: Craniocervical junction within normal limits. Bone marrow signal intensity within normal limits. Probable small benign hemangioma noted within the left lateral mass of C1. No other focal marrow replacing lesions. No scalp soft tissue abnormality.   Sinuses/Orbits: Patient status post bilateral ocular lens replacement. Globes and orbital soft tissues demonstrate no acute finding. Mild scattered mucosal thickening noted within the ethmoidal air cells. Paranasal sinuses are otherwise largely clear. Small bilateral mastoid effusions. Visualized nasopharynx is unremarkable. Inner ear structures grossly normal.   Other: None.   IMPRESSION: 1. 9 mm focus of diffusion abnormality involving the subcortical aspect of the left frontal centrum semi ovale, favored to reflect an evolving subacute white matter infarct. No associated hemorrhage or mass effect. 2. No other acute intracranial abnormality. No evidence for intracranial metastatic disease. 3. 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     Electronically Signed   By: Jeannine Boga M.D.   On: 09/23/2020 01:35   ADDENDUM: After discussing the case with Dr. Cheri Fowler, there is clinical concern for possible colovesical fistula. The abnormal bladder wall thickening is most pronounced superiorly where the bladder wall is inseparable from the large sigmoid colonic mass. Given  the history, colovesical fistula is likely.      Electronically Signed   By: Rolm Baptise M.D.   On: 09/20/2020 19:42    Addended by Rolm Baptise, MD on 09/20/2020  7:47 PM    Study Result  Narrative & Impression  CLINICAL DATA:  Lethargic. Fever. Abdominal abscess/infection expected   EXAM: CT ABDOMEN AND PELVIS WITH CONTRAST   TECHNIQUE: Multidetector CT imaging of the abdomen and pelvis was performed using the standard protocol following bolus administration of intravenous contrast.   CONTRAST:  115mL OMNIPAQUE IOHEXOL 350 MG/ML SOLN   COMPARISON:  09/01/2020   FINDINGS: Lower chest: Bilateral pleural effusions, left larger than right. Compressive atelectasis in the lung bases. Heart is borderline in size. Diffuse coronary artery calcifications and distal thoracic aortic calcifications.   Hepatobiliary: No focal hepatic abnormality. Gallbladder unremarkable.   Pancreas: No focal abnormality or ductal dilatation.   Spleen: No focal abnormality.  Normal size.   Adrenals/Urinary Tract: Foley catheter within the bladder. Bladder wall is irregular and thickened. There appears to be mucosal enhancement. Appearance is concerning for possible cystitis. Large anterior bladder wall diverticulum noted. Mild bilateral hydronephrosis, right greater than left, slightly improved since prior study.   Stomach/Bowel: Complex irregular mass again seen in the proximal to mid sigmoid colon, unchanged since recent study compatible with. No bowel obstruction. Stomach and small bowel decompressed, grossly unremarkable. Colonic malignancy   Vascular/Lymphatic: Aortic atherosclerosis. No evidence of aneurysm or adenopathy.   Reproductive: Prostate enlargement   Other: No free fluid or free air.   Musculoskeletal: No acute bony abnormality. Mild compression fractures through the inferior L3 vertebral body is similar to prior study.   IMPRESSION: Large complex sigmoid colonic mass again noted compatible with colon cancer. No  evidence of bowel obstruction.   Mild bladder wall thickening with apparent mucosal enhancement concerning for possible cystitis. Recommend clinical correlation. Foley catheter in place. Large anterior bladder wall diverticulum.   Mild bilateral hydronephrosis, right greater than left, slightly improved since prior study.   Prostate enlargement.   Bilateral pleural effusions, left greater than right. Compressive atelectasis in the left lower lobe.   Diffuse coronary artery disease.  Aortic atherosclerosis.   Electronically Signed: By: Rolm Baptise M.D. On: 09/20/2020 19:32     CT ANGIOGRAPHY CHEST WITH CONTRAST   TECHNIQUE: Multidetector CT imaging of the chest was performed using the standard protocol during bolus administration of intravenous contrast. Multiplanar CT image reconstructions and MIPs were obtained to evaluate the vascular anatomy.   CONTRAST:  148mL OMNIPAQUE IOHEXOL 350 MG/ML SOLN   COMPARISON:  None.   FINDINGS: Cardiovascular: Pulmonary emboli are seen in the right upper and lower lobe pulmonary arterial branches and also in the left upper lobe. No evidence of right heart strain. Diffuse coronary artery calcifications and moderate aortic calcifications. Tortuous aorta. No aneurysm.   Mediastinum/Nodes: No mediastinal, hilar, or axillary adenopathy. Trachea and esophagus are unremarkable. Thyroid unremarkable.   Lungs/Pleura: Moderate left pleural effusion and small right pleural effusion. Compressive atelectasis in the left lower lobe. Minimal right base atelectasis.   Upper Abdomen: Imaging into the upper abdomen demonstrates no acute findings.   Musculoskeletal: Abnormal soft tissue noted in the left lateral chest wall measuring up to 12.5 cm in AP dimension on image 80 of series 4. This may reflect large left lateral chest wall hematoma. This conceivably also could reflect asymmetric edema/anasarca as similar finding was seen on prior  abdominal CT from 09/01/2020.No acute bony abnormality.  Review of the MIP images confirms the above findings.   IMPRESSION: Pulmonary emboli bilaterally, most notable in the right lung particularly right lower lobe. No evidence of right heart strain.   Moderate left pleural effusion and small right pleural effusion. Compressive atelectasis in the left lower lobe.   Diffuse coronary artery calcifications.   Aortic Atherosclerosis (ICD10-I70.0).   Critical Value/emergent results were called by telephone at the time of interpretation on 09/20/2020 at 7:36 pm to provider [facility] , who verbally acknowledged these results.     Electronically Signed   By: Rolm Baptise M.D.   On: 09/20/2020 19:40    Treatments:  Heparin, antibiotics  Discharge Exam: Blood pressure 121/62, pulse 90, temperature 98.5 F (36.9 C), temperature source Oral, resp. rate 15, height 5\' 7"  (1.702 m), weight 96.3 kg, SpO2 94 %. General appearance: alert, cooperative, and ill appearing Resp: clear to auscultation bilaterally Cardio: regular rate and rhythm, S1, S2 normal, no murmur, click, rub or gallop GI: soft, non-tender; bowel sounds normal; no masses,  no organomegaly Extremities: extremities normal, atraumatic, no cyanosis or edema  Disposition: Discharge disposition: [place]       Discharge Instructions     Ambulatory referral to Hospice   Complete by: As directed    Diet - low sodium heart healthy   Complete by: As directed    Discharge wound care:   Complete by: As directed    Follow by hospice nurse   Increase activity slowly   Complete by: As directed       Allergies as of 09/26/2020       Reactions   Antihistamines, Diphenhydramine-type    Bactrim [sulfamethoxazole-trimethoprim] Nausea Only   Chamomile Other (See Comments)   Diphenhydramine Hcl Other (See Comments)   dizziness   Gabapentin    Levomenol    Pseudoephedrine Hcl Other (See Comments)   dizziness    Rosuvastatin    Triprolidine Hcl Other (See Comments)   dizziness        Medication List     STOP taking these medications    acetaminophen 325 MG tablet Commonly known as: TYLENOL   loperamide 2 MG capsule Commonly known as: IMODIUM   Magnesium 250 MG Tabs   magnesium oxide 400 MG tablet Commonly known as: MAG-OX   multivitamin with minerals Tabs tablet   potassium chloride SA 20 MEQ tablet Commonly known as: KLOR-CON   tamsulosin 0.4 MG Caps capsule Commonly known as: FLOMAX       TAKE these medications    cephALEXin 500 MG capsule Commonly known as: KEFLEX Take 1 capsule (500 mg total) by mouth 3 (three) times daily for 7 days.   cyclobenzaprine 10 MG tablet Commonly known as: FLEXERIL Take 0.5 tablets (5 mg total) by mouth 3 (three) times daily as needed for muscle spasms.   nystatin powder Commonly known as: MYCOSTATIN/NYSTOP Apply topically 2 (two) times daily.   oxyCODONE-acetaminophen 10-325 MG tablet Commonly known as: Percocet Take 1 tablet by mouth every 6 (six) hours as needed for up to 7 days for pain.   promethazine 12.5 MG tablet Commonly known as: PHENERGAN Take 1 tablet (12.5 mg total) by mouth every 6 (six) hours as needed for nausea.   traZODone 50 MG tablet Commonly known as: DESYREL Take 0.5 tablets (25 mg total) by mouth at bedtime as needed for sleep.               Discharge Care Instructions  (From admission, onward)  Start     Ordered   09/26/20 0000  Discharge wound care:       Comments: Follow by hospice nurse   09/26/20 1002            34 minutes Signed: Sharen Hones 09/26/2020, 10:02 AM

## 2020-09-26 NOTE — Progress Notes (Signed)
Palliative: Keith Reynolds and his family has elected comfort care.  Orders adjusted by attending.  They are requesting residential hospice for comfort and dignity at end-of-life.  At this point though Keith Reynolds will discharge to [facility] under long-term care private pay until he is acccepted into residential hospice, [place].  Conference with [employer].  No charge Quinn Axe, NP Palliative medicine team Team phone [phone] Greater than 50% of this time was spent counseling and coordinating care related to the above assessment and plan.

## 2020-09-27 ENCOUNTER — Inpatient Hospital Stay: Payer: PPO

## 2020-09-27 LAB — RESP PANEL BY RT-PCR (FLU A&B, COVID) ARPGX2
Influenza A by PCR: NEGATIVE
Influenza B by PCR: NEGATIVE
SARS Coronavirus 2 by RT PCR: NEGATIVE

## 2020-09-27 NOTE — Progress Notes (Signed)
Patient with 567ml urine on bladder scan.  MD notified and foley irrigated per order.  Urine with multiple clots and large amount of sediment.  Foley draining freely after irrigation and bag change.  Will continue to monitor.

## 2020-09-27 NOTE — TOC Transition Note (Signed)
Transition of Care [facility]) - CM/SW Discharge Note   Patient Details  Name: Keith Reynolds MRN: [id number] Date of Birth: 07/05/1940  [facility] CM/SW Contact:  Shelbie Hutching, RN Phone Number: 09/27/2020, 1:58 PM   Clinical Narrative:    Patient discharging to [facility] today and will be followed by [facility].  Patient going to room 808.  Bedside RN will call report.  [facility] has been arranged and patient is next up on the list for pick up.    Final next level of care: [facility] ([facility]) Barriers to Discharge: Barriers Resolved   Patient Goals and CMS Choice Patient states their goals for this hospitalization and ongoing recovery are:: [facility] CMS Medicare.gov Compare Post Acute Care list provided to:: Patient Represenative (must comment) Choice offered to / list presented to : Patient, Adult Children  Discharge Placement              Patient chooses bed at: [facility]  Patient to be transferred to facility by: [facility] Name of family member notified: [name] Patient and family notified of of transfer: 09/27/20  Discharge Plan and Services In-house Referral: NA Discharge Planning Services: NA Post Acute Care Choice: [place]          DME Arranged: N/A DME Agency: NA       HH Arranged: NA          Social Determinants of Health (SDOH) Interventions     Readmission Risk Interventions No flowsheet data found.

## 2020-09-27 NOTE — Discharge Summary (Addendum)
Physician Discharge Summary  Patient ID: Keith Reynolds MRN: [id number] DOB/AGE: January 20, 1941 80 y.o.  Admit date: 09/20/2020 Discharge date: 09/27/2020  Admission Diagnoses:  Discharge Diagnoses:  Principal Problem:   Bilateral pulmonary embolism ([place]) Active Problems:   Diabetes mellitus, type II ([place])   Hypertension   Hyponatremia   Diarrhea   Colorectal cancer ([place])   Colovesical fistula   Indwelling Foley catheter present   Acute respiratory failure with hypoxia ([place])   Sepsis ([place])   Palliative care encounter   Discharged Condition: poor  Hospital Course:  Keith Reynolds is a 80 y.o. male with medical history significant for DM, HTN, recently diagnosed colorectal cancer, with known colovesical fistula with indwelling Foley, recently hospitalized from 5/19-5/25 for persistent diarrhea and severe hyponatremia, improved and discharged to SNF who was in his usual state of health until the morning of admission when he had an outpatient doctor's appointment however by the afternoon he was noted to be lethargic at the facility, febrile at 101.2 with O2 sats 80% on room air.  Requiring [facility] for transport with EMS.  Patient has been having continued watery diarrhea and poor appetite but no vomiting.  He has suprapubic abdominal pain with mild to moderate intensity, nonradiating.  Has some cough and shortness of breath but no chest pain.  He has generalized weakness. ED course: On arrival, tachycardic at 113, tachypneic at 22 with O2 sat of 95 on nonrebreather.  Afebrile with BP 117/60 CT angiogram of the chest showed bilateral pulmonary emboli, duplex ultrasound showed left lower extremity DVT.  CT abdomen/pelvis showed large colon mass with colovesical fistula. Patient also had an MRI of the brain, cervical spine thoracic spine, no metastasis was identified.  It did show white matter stroke in the left frontal lobe. Patient is treated with IV heparin, he developed gross  hematuria. After discussion with oncology, patient is seen by palliative care.  Patient prognosis is poor.   1.  Bilateral pulmonary embolism. Acute respiratory failure with hypoxia. Decision was made after talking to family not to treat PE.  Patient be transferred to long-term care with hospice.   #2.  Sepsis. Acute cystitis secondary to colovesicular fistula and indwelling Foley catheter. Colovesicular fistula due to metastatic colon cancer. Colorectal cancer. Gross hematuria. Chronic urinary retention due to benign prostate hypertrophy with indwelling Foley catheter. We will keep the Foley catheter.  Due to symptomatic UTI, treat for 7 days of oral antibiotics with Keflex.   #3.  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 Progressive vision loss. Bilateral lower extremity weakness. Condition most likely due to stroke.  Due to poor prognosis, no additional work-up or treatment is indicated.   #4.  Type 2 diabetes Essential hypertension   #5. Pressure ulcer POA. Pressure Injury 08/29/20 Heel Right Deep Tissue Pressure Injury - Purple or maroon localized area of discolored intact skin or blood-filled blister due to damage of underlying soft tissue from pressure and/or shear. reddened (Active)  08/29/20 1800  Location: Heel  Location Orientation: Right  Staging: Deep Tissue Pressure Injury - Purple or maroon localized area of discolored intact skin or blood-filled blister due to damage of underlying soft tissue from pressure and/or shear.  Wound Description (Comments): reddened  Present on Admission: Yes     Pressure Injury 09/21/20 Sacrum Medial Stage 3 -  Full thickness tissue loss. Subcutaneous fat may be visible but bone, tendon or muscle are NOT exposed. (Active)  09/21/20 1600  Location: Sacrum  Location Orientation:  Medial  Staging: Stage 3 -  Full thickness tissue loss. Subcutaneous fat may be visible but bone, tendon or muscle are NOT exposed.  Wound Description  (Comments):  Present on Admission: Yes     Pressure Injury 09/21/20 Buttocks Right Stage 3 -  Full thickness tissue loss. Subcutaneous fat may be visible but bone, tendon or muscle are NOT exposed. (Active)  09/21/20 1600  Location: Buttocks  Location Orientation: Right  Staging: Stage 3 -  Full thickness tissue loss. Subcutaneous fat may be visible but bone, tendon or muscle are NOT exposed.  Wound Description (Comments):  Present on Admission: Yes   Patient was not able to be discharged to SNF yesterday, no bed available. Patient was having more abdominal distention, bladder scan showed more than 500 mL residual, Foley catheter will be exchanged before discharge, will make sure Foley cath will work.  Consults: hematology/oncology  Significant Diagnostic Studies:  See discharge summary performed yesterday.  Treatments: IV heparin  Discharge Exam: Blood pressure 136/64, pulse 90, temperature 97.7 F (36.5 C), temperature source Oral, resp. rate 18, height 5\' 7"  (1.702 m), weight 96.3 kg, SpO2 95 %. General appearance: alert and cooperative Resp: clear to auscultation bilaterally Cardio: regular rate and rhythm, S1, S2 normal, no murmur, click, rub or gallop GI: soft, with distention, none tender Extremities: extremities normal, atraumatic, no cyanosis or edema  Disposition: Discharge disposition: 01-Home or Self Care       Discharge Instructions     Ambulatory referral to Hospice   Complete by: As directed    Diet - low sodium heart healthy   Complete by: As directed    Diet - low sodium heart healthy   Complete by: As directed    Discharge wound care:   Complete by: As directed    Follow by hospice nurse   Discharge wound care:   Complete by: As directed    Follow with hospice   Increase activity slowly   Complete by: As directed    Increase activity slowly   Complete by: As directed       Allergies as of 09/27/2020       Reactions   Antihistamines,  Diphenhydramine-type    Bactrim [sulfamethoxazole-trimethoprim] Nausea Only   Chamomile Other (See Comments)   Diphenhydramine Hcl Other (See Comments)   dizziness   Gabapentin    Levomenol    Pseudoephedrine Hcl Other (See Comments)   dizziness   Rosuvastatin    Triprolidine Hcl Other (See Comments)   dizziness        Medication List     STOP taking these medications    acetaminophen 325 MG tablet Commonly known as: TYLENOL   loperamide 2 MG capsule Commonly known as: IMODIUM   Magnesium 250 MG Tabs   magnesium oxide 400 MG tablet Commonly known as: MAG-OX   multivitamin with minerals Tabs tablet   potassium chloride SA 20 MEQ tablet Commonly known as: KLOR-CON   tamsulosin 0.4 MG Caps capsule Commonly known as: FLOMAX       TAKE these medications    cephALEXin 500 MG capsule Commonly known as: KEFLEX Take 1 capsule (500 mg total) by mouth 3 (three) times daily for 7 days.   cyclobenzaprine 10 MG tablet Commonly known as: FLEXERIL Take 0.5 tablets (5 mg total) by mouth 3 (three) times daily as needed for muscle spasms.   nystatin powder Commonly known as: MYCOSTATIN/NYSTOP Apply topically 2 (two) times daily.   oxyCODONE-acetaminophen 10-325 MG tablet Commonly known  as: Percocet Take 1 tablet by mouth every 6 (six) hours as needed for up to 7 days for pain.   promethazine 12.5 MG tablet Commonly known as: PHENERGAN Take 1 tablet (12.5 mg total) by mouth every 6 (six) hours as needed for nausea.   traZODone 50 MG tablet Commonly known as: DESYREL Take 0.5 tablets (25 mg total) by mouth at bedtime as needed for sleep.               Discharge Care Instructions  (From admission, onward)           Start     Ordered   09/27/20 0000  Discharge wound care:       Comments: Follow with hospice   09/27/20 1009   09/26/20 0000  Discharge wound care:       Comments: Follow by hospice nurse   09/26/20 1002              Signed: Sharen Hones 09/27/2020, 10:10 AM

## 2020-09-27 NOTE — Care Management Important Message (Signed)
Important Message  Patient Details  Name: Keith Reynolds MRN: [id number] Date of Birth: 02-Sep-1940   Medicare Important Message Given:  Yes     Juliann Pulse A Thoma Paulsen 09/27/2020, 10:17 AM

## 2020-09-28 DIAGNOSIS — N1330 Unspecified hydronephrosis: Secondary | ICD-10-CM | POA: Diagnosis not present

## 2020-09-28 DIAGNOSIS — E440 Moderate protein-calorie malnutrition: Secondary | ICD-10-CM | POA: Diagnosis not present

## 2020-09-28 DIAGNOSIS — F5105 Insomnia due to other mental disorder: Secondary | ICD-10-CM | POA: Diagnosis not present

## 2020-09-28 DIAGNOSIS — R338 Other retention of urine: Secondary | ICD-10-CM | POA: Diagnosis not present

## 2020-09-28 DIAGNOSIS — E569 Vitamin deficiency, unspecified: Secondary | ICD-10-CM | POA: Diagnosis not present

## 2020-09-28 DIAGNOSIS — Z736 Limitation of activities due to disability: Secondary | ICD-10-CM | POA: Diagnosis not present

## 2020-09-28 DIAGNOSIS — C189 Malignant neoplasm of colon, unspecified: Secondary | ICD-10-CM | POA: Diagnosis not present

## 2020-09-28 DIAGNOSIS — R2681 Unsteadiness on feet: Secondary | ICD-10-CM | POA: Diagnosis not present

## 2020-09-30 DIAGNOSIS — E871 Hypo-osmolality and hyponatremia: Secondary | ICD-10-CM | POA: Diagnosis not present

## 2020-09-30 DIAGNOSIS — C189 Malignant neoplasm of colon, unspecified: Secondary | ICD-10-CM | POA: Diagnosis not present

## 2020-10-01 DIAGNOSIS — I10 Essential (primary) hypertension: Secondary | ICD-10-CM | POA: Diagnosis not present

## 2020-10-01 DIAGNOSIS — J90 Pleural effusion, not elsewhere classified: Secondary | ICD-10-CM | POA: Diagnosis not present

## 2020-10-01 DIAGNOSIS — R0989 Other specified symptoms and signs involving the circulatory and respiratory systems: Secondary | ICD-10-CM | POA: Diagnosis not present

## 2020-10-02 DIAGNOSIS — I509 Heart failure, unspecified: Secondary | ICD-10-CM | POA: Diagnosis not present

## 2020-10-02 DIAGNOSIS — I2699 Other pulmonary embolism without acute cor pulmonale: Secondary | ICD-10-CM | POA: Diagnosis not present

## 2020-10-02 DIAGNOSIS — N1330 Unspecified hydronephrosis: Secondary | ICD-10-CM | POA: Diagnosis not present

## 2020-10-02 DIAGNOSIS — R000 Tachycardia, unspecified: Secondary | ICD-10-CM | POA: Diagnosis not present

## 2020-10-02 DIAGNOSIS — R0902 Hypoxemia: Secondary | ICD-10-CM | POA: Diagnosis not present

## 2020-10-02 DIAGNOSIS — E871 Hypo-osmolality and hyponatremia: Secondary | ICD-10-CM | POA: Diagnosis not present

## 2020-10-02 DIAGNOSIS — C189 Malignant neoplasm of colon, unspecified: Secondary | ICD-10-CM | POA: Diagnosis not present

## 2020-10-02 DIAGNOSIS — I82401 Acute embolism and thrombosis of unspecified deep veins of right lower extremity: Secondary | ICD-10-CM | POA: Diagnosis not present

## 2020-10-03 DIAGNOSIS — C189 Malignant neoplasm of colon, unspecified: Secondary | ICD-10-CM | POA: Diagnosis not present

## 2020-10-03 DIAGNOSIS — E871 Hypo-osmolality and hyponatremia: Secondary | ICD-10-CM | POA: Diagnosis not present

## 2020-10-03 DIAGNOSIS — R000 Tachycardia, unspecified: Secondary | ICD-10-CM | POA: Diagnosis not present

## 2020-10-03 DIAGNOSIS — R0902 Hypoxemia: Secondary | ICD-10-CM | POA: Diagnosis not present

## 2020-10-04 DIAGNOSIS — E871 Hypo-osmolality and hyponatremia: Secondary | ICD-10-CM | POA: Diagnosis not present

## 2020-10-04 DIAGNOSIS — I10 Essential (primary) hypertension: Secondary | ICD-10-CM | POA: Diagnosis not present

## 2020-10-04 DIAGNOSIS — C189 Malignant neoplasm of colon, unspecified: Secondary | ICD-10-CM | POA: Diagnosis not present

## 2020-10-04 DIAGNOSIS — R000 Tachycardia, unspecified: Secondary | ICD-10-CM | POA: Diagnosis not present

## 2020-10-04 DIAGNOSIS — R0902 Hypoxemia: Secondary | ICD-10-CM | POA: Diagnosis not present

## 2020-10-08 DIAGNOSIS — C189 Malignant neoplasm of colon, unspecified: Secondary | ICD-10-CM | POA: Diagnosis not present

## 2020-10-08 DIAGNOSIS — E871 Hypo-osmolality and hyponatremia: Secondary | ICD-10-CM | POA: Diagnosis not present

## 2020-10-08 DIAGNOSIS — R0902 Hypoxemia: Secondary | ICD-10-CM | POA: Diagnosis not present

## 2020-10-10 DIAGNOSIS — R0902 Hypoxemia: Secondary | ICD-10-CM | POA: Diagnosis not present

## 2020-10-10 DIAGNOSIS — C189 Malignant neoplasm of colon, unspecified: Secondary | ICD-10-CM | POA: Diagnosis not present

## 2020-10-10 DIAGNOSIS — E871 Hypo-osmolality and hyponatremia: Secondary | ICD-10-CM | POA: Diagnosis not present

## 2020-11-11 DEATH — deceased

## 2021-04-30 ENCOUNTER — Encounter: Payer: Self-pay | Admitting: Internal Medicine
